# Patient Record
Sex: Female | Born: 1955
Health system: Southern US, Community
[De-identification: ages and names within clinical notes are randomized; demographics above are authoritative.]

## PROBLEM LIST (undated history)

## (undated) DIAGNOSIS — E0944 Drug or chemical induced diabetes mellitus with neurological complications with diabetic amyotrophy: Secondary | ICD-10-CM

## (undated) DIAGNOSIS — L650 Telogen effluvium: Secondary | ICD-10-CM

## (undated) DIAGNOSIS — E782 Mixed hyperlipidemia: Secondary | ICD-10-CM

## (undated) DIAGNOSIS — I10 Essential (primary) hypertension: Secondary | ICD-10-CM

## (undated) DIAGNOSIS — G4730 Sleep apnea, unspecified: Secondary | ICD-10-CM

## (undated) DIAGNOSIS — R601 Generalized edema: Secondary | ICD-10-CM

## (undated) DIAGNOSIS — L97219 Non-pressure chronic ulcer of right calf with unspecified severity: Secondary | ICD-10-CM

## (undated) DIAGNOSIS — E785 Hyperlipidemia, unspecified: Secondary | ICD-10-CM

## (undated) DIAGNOSIS — F329 Major depressive disorder, single episode, unspecified: Secondary | ICD-10-CM

## (undated) DIAGNOSIS — M797 Fibromyalgia: Secondary | ICD-10-CM

## (undated) DIAGNOSIS — E119 Type 2 diabetes mellitus without complications: Secondary | ICD-10-CM

## (undated) DIAGNOSIS — G43909 Migraine, unspecified, not intractable, without status migrainosus: Secondary | ICD-10-CM

## (undated) HISTORY — DX: Drug or chemical induced diabetes mellitus with neurological complications with diabetic amyotrophy: E09.44

## (undated) HISTORY — DX: Major depressive disorder, single episode, unspecified: F32.9

## (undated) HISTORY — DX: Essential (primary) hypertension: I10

## (undated) HISTORY — DX: Fibromyalgia: M79.7

## (undated) HISTORY — PX: CHOLECYSTECTOMY: SHX55

## (undated) HISTORY — DX: Mixed hyperlipidemia: E78.2

## (undated) HISTORY — DX: Sleep apnea, unspecified: G47.30

## (undated) HISTORY — DX: Hyperlipidemia, unspecified: E78.5

## (undated) HISTORY — DX: Telogen effluvium: L65.0

## (undated) HISTORY — PX: OTHER SURGICAL HISTORY: SHX169

## (undated) HISTORY — DX: Generalized edema: R60.1

## (undated) HISTORY — DX: Type 2 diabetes mellitus without complications: E11.9

## (undated) HISTORY — DX: Non-pressure chronic ulcer of right calf with unspecified severity: L97.219

## (undated) HISTORY — DX: Migraine, unspecified, not intractable, without status migrainosus: G43.909

---

## 2008-11-22 ENCOUNTER — Emergency Department (HOSPITAL_COMMUNITY): Admission: EM | Admit: 2008-11-22 | Discharge: 2008-11-22 | Payer: Self-pay | Admitting: Emergency Medicine

## 2008-11-24 ENCOUNTER — Ambulatory Visit (HOSPITAL_COMMUNITY): Admission: RE | Admit: 2008-11-24 | Discharge: 2008-11-24 | Payer: Self-pay | Admitting: Emergency Medicine

## 2008-11-28 ENCOUNTER — Emergency Department (HOSPITAL_COMMUNITY): Admission: EM | Admit: 2008-11-28 | Discharge: 2008-11-28 | Payer: Self-pay | Admitting: Emergency Medicine

## 2008-12-24 ENCOUNTER — Emergency Department (HOSPITAL_COMMUNITY): Admission: EM | Admit: 2008-12-24 | Discharge: 2008-12-24 | Payer: Self-pay | Admitting: Emergency Medicine

## 2009-02-10 ENCOUNTER — Ambulatory Visit (HOSPITAL_COMMUNITY): Admission: RE | Admit: 2009-02-10 | Discharge: 2009-02-10 | Payer: Self-pay | Admitting: Neurology

## 2009-03-17 ENCOUNTER — Ambulatory Visit (HOSPITAL_COMMUNITY): Admission: RE | Admit: 2009-03-17 | Discharge: 2009-03-17 | Payer: Self-pay | Admitting: Neurology

## 2010-03-13 ENCOUNTER — Emergency Department (HOSPITAL_COMMUNITY)
Admission: EM | Admit: 2010-03-13 | Discharge: 2010-03-13 | Payer: Self-pay | Source: Home / Self Care | Admitting: Family Medicine

## 2010-04-26 ENCOUNTER — Encounter: Payer: Self-pay | Admitting: Neurology

## 2010-07-07 LAB — CSF CELL COUNT WITH DIFFERENTIAL
RBC Count, CSF: 0 /mm3
WBC, CSF: 1 /mm3 (ref 0–5)

## 2010-07-07 LAB — VDRL, CSF: VDRL Quant, CSF: NONREACTIVE

## 2010-07-07 LAB — OLIGOCLONAL BANDS, CSF + SERM

## 2010-07-07 LAB — GLUCOSE, CAPILLARY: Glucose-Capillary: 98 mg/dL (ref 70–99)

## 2010-07-10 LAB — GLUCOSE, CAPILLARY: Glucose-Capillary: 236 mg/dL — ABNORMAL HIGH (ref 70–99)

## 2010-07-11 LAB — CBC
HCT: 41.3 % (ref 36.0–46.0)
Hemoglobin: 14 g/dL (ref 12.0–15.0)
RBC: 4.61 MIL/uL (ref 3.87–5.11)
RDW: 13.1 % (ref 11.5–15.5)

## 2010-07-11 LAB — BASIC METABOLIC PANEL
BUN: 22 mg/dL (ref 6–23)
Chloride: 103 mEq/L (ref 96–112)
Creatinine, Ser: 1.02 mg/dL (ref 0.4–1.2)
GFR calc non Af Amer: 56 mL/min — ABNORMAL LOW (ref >60)

## 2010-07-11 LAB — DIFFERENTIAL
Basophils Absolute: 0.1 10*3/uL (ref 0.0–0.1)
Eosinophils Relative: 5 % (ref 0–5)
Lymphocytes Relative: 25 % (ref 12–46)
Monocytes Absolute: 0.7 10*3/uL (ref 0.1–1.0)
Monocytes Relative: 7 % (ref 3–12)

## 2010-07-11 LAB — RAPID URINE DRUG SCREEN, HOSP PERFORMED
Amphetamines: NOT DETECTED
Benzodiazepines: NOT DETECTED
Cocaine: NOT DETECTED
Opiates: POSITIVE — AB
Tetrahydrocannabinol: NOT DETECTED

## 2010-07-11 LAB — GLUCOSE, CAPILLARY: Glucose-Capillary: 214 mg/dL — ABNORMAL HIGH (ref 70–99)

## 2010-07-11 LAB — URINALYSIS, ROUTINE W REFLEX MICROSCOPIC
Bilirubin Urine: NEGATIVE
Glucose, UA: 250 mg/dL — AB
Hgb urine dipstick: NEGATIVE
Ketones, ur: NEGATIVE mg/dL
Nitrite: NEGATIVE
Specific Gravity, Urine: 1.025 (ref 1.005–1.030)
pH: 5.5 (ref 5.0–8.0)

## 2011-01-20 ENCOUNTER — Other Ambulatory Visit (HOSPITAL_COMMUNITY): Payer: Self-pay | Admitting: Family Medicine

## 2011-01-20 DIAGNOSIS — Z780 Asymptomatic menopausal state: Secondary | ICD-10-CM

## 2011-01-20 DIAGNOSIS — Z1231 Encounter for screening mammogram for malignant neoplasm of breast: Secondary | ICD-10-CM

## 2011-02-10 ENCOUNTER — Ambulatory Visit (HOSPITAL_COMMUNITY): Payer: Self-pay

## 2011-03-10 ENCOUNTER — Ambulatory Visit (HOSPITAL_COMMUNITY): Payer: Self-pay

## 2011-07-01 ENCOUNTER — Encounter (INDEPENDENT_AMBULATORY_CARE_PROVIDER_SITE_OTHER): Payer: Medicare PPO | Admitting: Ophthalmology

## 2011-07-01 DIAGNOSIS — I10 Essential (primary) hypertension: Secondary | ICD-10-CM

## 2011-07-01 DIAGNOSIS — H2510 Age-related nuclear cataract, unspecified eye: Secondary | ICD-10-CM

## 2011-07-01 DIAGNOSIS — H43819 Vitreous degeneration, unspecified eye: Secondary | ICD-10-CM

## 2011-07-01 DIAGNOSIS — H35039 Hypertensive retinopathy, unspecified eye: Secondary | ICD-10-CM

## 2011-07-01 DIAGNOSIS — E11319 Type 2 diabetes mellitus with unspecified diabetic retinopathy without macular edema: Secondary | ICD-10-CM

## 2011-07-01 DIAGNOSIS — H3581 Retinal edema: Secondary | ICD-10-CM

## 2011-07-01 DIAGNOSIS — E1039 Type 1 diabetes mellitus with other diabetic ophthalmic complication: Secondary | ICD-10-CM

## 2011-07-22 ENCOUNTER — Encounter (INDEPENDENT_AMBULATORY_CARE_PROVIDER_SITE_OTHER): Payer: Medicare PPO | Admitting: Ophthalmology

## 2011-07-22 DIAGNOSIS — H3581 Retinal edema: Secondary | ICD-10-CM

## 2011-11-22 ENCOUNTER — Ambulatory Visit (INDEPENDENT_AMBULATORY_CARE_PROVIDER_SITE_OTHER): Payer: Medicare PPO | Admitting: Ophthalmology

## 2011-12-08 ENCOUNTER — Ambulatory Visit (INDEPENDENT_AMBULATORY_CARE_PROVIDER_SITE_OTHER): Payer: Medicare PPO | Admitting: Ophthalmology

## 2011-12-08 DIAGNOSIS — H43819 Vitreous degeneration, unspecified eye: Secondary | ICD-10-CM

## 2011-12-08 DIAGNOSIS — E11319 Type 2 diabetes mellitus with unspecified diabetic retinopathy without macular edema: Secondary | ICD-10-CM

## 2011-12-08 DIAGNOSIS — E1065 Type 1 diabetes mellitus with hyperglycemia: Secondary | ICD-10-CM

## 2011-12-08 DIAGNOSIS — I10 Essential (primary) hypertension: Secondary | ICD-10-CM

## 2011-12-08 DIAGNOSIS — E1039 Type 1 diabetes mellitus with other diabetic ophthalmic complication: Secondary | ICD-10-CM

## 2011-12-08 DIAGNOSIS — H35039 Hypertensive retinopathy, unspecified eye: Secondary | ICD-10-CM

## 2011-12-08 DIAGNOSIS — H2510 Age-related nuclear cataract, unspecified eye: Secondary | ICD-10-CM

## 2012-06-06 ENCOUNTER — Ambulatory Visit (INDEPENDENT_AMBULATORY_CARE_PROVIDER_SITE_OTHER): Payer: Medicare PPO | Admitting: Ophthalmology

## 2012-07-07 ENCOUNTER — Ambulatory Visit (INDEPENDENT_AMBULATORY_CARE_PROVIDER_SITE_OTHER): Payer: Medicare Other | Admitting: Ophthalmology

## 2012-07-07 DIAGNOSIS — H35039 Hypertensive retinopathy, unspecified eye: Secondary | ICD-10-CM

## 2012-07-07 DIAGNOSIS — E1039 Type 1 diabetes mellitus with other diabetic ophthalmic complication: Secondary | ICD-10-CM

## 2012-07-07 DIAGNOSIS — H2510 Age-related nuclear cataract, unspecified eye: Secondary | ICD-10-CM

## 2012-07-07 DIAGNOSIS — I10 Essential (primary) hypertension: Secondary | ICD-10-CM

## 2012-07-07 DIAGNOSIS — E11319 Type 2 diabetes mellitus with unspecified diabetic retinopathy without macular edema: Secondary | ICD-10-CM

## 2012-07-07 DIAGNOSIS — E1065 Type 1 diabetes mellitus with hyperglycemia: Secondary | ICD-10-CM

## 2012-07-07 DIAGNOSIS — D3130 Benign neoplasm of unspecified choroid: Secondary | ICD-10-CM

## 2013-04-13 ENCOUNTER — Ambulatory Visit (INDEPENDENT_AMBULATORY_CARE_PROVIDER_SITE_OTHER): Payer: Medicare Other | Admitting: Ophthalmology

## 2013-04-13 DIAGNOSIS — I10 Essential (primary) hypertension: Secondary | ICD-10-CM

## 2013-04-13 DIAGNOSIS — E1139 Type 2 diabetes mellitus with other diabetic ophthalmic complication: Secondary | ICD-10-CM

## 2013-04-13 DIAGNOSIS — D3130 Benign neoplasm of unspecified choroid: Secondary | ICD-10-CM

## 2013-04-13 DIAGNOSIS — H35039 Hypertensive retinopathy, unspecified eye: Secondary | ICD-10-CM

## 2013-04-13 DIAGNOSIS — E1165 Type 2 diabetes mellitus with hyperglycemia: Secondary | ICD-10-CM

## 2013-04-13 DIAGNOSIS — E11319 Type 2 diabetes mellitus with unspecified diabetic retinopathy without macular edema: Secondary | ICD-10-CM

## 2013-04-13 DIAGNOSIS — H43819 Vitreous degeneration, unspecified eye: Secondary | ICD-10-CM

## 2014-01-11 ENCOUNTER — Ambulatory Visit (INDEPENDENT_AMBULATORY_CARE_PROVIDER_SITE_OTHER): Payer: Medicare Other | Admitting: Ophthalmology

## 2014-04-09 DIAGNOSIS — F329 Major depressive disorder, single episode, unspecified: Secondary | ICD-10-CM | POA: Diagnosis not present

## 2014-04-09 DIAGNOSIS — E119 Type 2 diabetes mellitus without complications: Secondary | ICD-10-CM | POA: Diagnosis not present

## 2014-04-09 DIAGNOSIS — E669 Obesity, unspecified: Secondary | ICD-10-CM | POA: Diagnosis not present

## 2014-04-09 DIAGNOSIS — Z7982 Long term (current) use of aspirin: Secondary | ICD-10-CM | POA: Diagnosis not present

## 2014-04-09 DIAGNOSIS — I10 Essential (primary) hypertension: Secondary | ICD-10-CM | POA: Diagnosis not present

## 2014-04-09 DIAGNOSIS — Z791 Long term (current) use of non-steroidal anti-inflammatories (NSAID): Secondary | ICD-10-CM | POA: Diagnosis not present

## 2014-04-09 DIAGNOSIS — M1990 Unspecified osteoarthritis, unspecified site: Secondary | ICD-10-CM | POA: Diagnosis not present

## 2014-04-09 DIAGNOSIS — Z794 Long term (current) use of insulin: Secondary | ICD-10-CM | POA: Diagnosis not present

## 2014-04-11 DIAGNOSIS — E1165 Type 2 diabetes mellitus with hyperglycemia: Secondary | ICD-10-CM | POA: Diagnosis not present

## 2014-04-12 DIAGNOSIS — Z791 Long term (current) use of non-steroidal anti-inflammatories (NSAID): Secondary | ICD-10-CM | POA: Diagnosis not present

## 2014-04-12 DIAGNOSIS — E119 Type 2 diabetes mellitus without complications: Secondary | ICD-10-CM | POA: Diagnosis not present

## 2014-04-12 DIAGNOSIS — E669 Obesity, unspecified: Secondary | ICD-10-CM | POA: Diagnosis not present

## 2014-04-12 DIAGNOSIS — Z794 Long term (current) use of insulin: Secondary | ICD-10-CM | POA: Diagnosis not present

## 2014-04-12 DIAGNOSIS — Z7982 Long term (current) use of aspirin: Secondary | ICD-10-CM | POA: Diagnosis not present

## 2014-04-12 DIAGNOSIS — I10 Essential (primary) hypertension: Secondary | ICD-10-CM | POA: Diagnosis not present

## 2014-04-12 DIAGNOSIS — F329 Major depressive disorder, single episode, unspecified: Secondary | ICD-10-CM | POA: Diagnosis not present

## 2014-04-12 DIAGNOSIS — M1990 Unspecified osteoarthritis, unspecified site: Secondary | ICD-10-CM | POA: Diagnosis not present

## 2014-04-16 DIAGNOSIS — Z791 Long term (current) use of non-steroidal anti-inflammatories (NSAID): Secondary | ICD-10-CM | POA: Diagnosis not present

## 2014-04-16 DIAGNOSIS — Z794 Long term (current) use of insulin: Secondary | ICD-10-CM | POA: Diagnosis not present

## 2014-04-16 DIAGNOSIS — Z7982 Long term (current) use of aspirin: Secondary | ICD-10-CM | POA: Diagnosis not present

## 2014-04-16 DIAGNOSIS — E119 Type 2 diabetes mellitus without complications: Secondary | ICD-10-CM | POA: Diagnosis not present

## 2014-04-16 DIAGNOSIS — E669 Obesity, unspecified: Secondary | ICD-10-CM | POA: Diagnosis not present

## 2014-04-16 DIAGNOSIS — M1990 Unspecified osteoarthritis, unspecified site: Secondary | ICD-10-CM | POA: Diagnosis not present

## 2014-04-16 DIAGNOSIS — I10 Essential (primary) hypertension: Secondary | ICD-10-CM | POA: Diagnosis not present

## 2014-04-16 DIAGNOSIS — E1165 Type 2 diabetes mellitus with hyperglycemia: Secondary | ICD-10-CM | POA: Diagnosis not present

## 2014-04-16 DIAGNOSIS — F329 Major depressive disorder, single episode, unspecified: Secondary | ICD-10-CM | POA: Diagnosis not present

## 2014-04-17 DIAGNOSIS — I10 Essential (primary) hypertension: Secondary | ICD-10-CM | POA: Diagnosis not present

## 2014-04-17 DIAGNOSIS — E669 Obesity, unspecified: Secondary | ICD-10-CM | POA: Diagnosis not present

## 2014-04-17 DIAGNOSIS — F329 Major depressive disorder, single episode, unspecified: Secondary | ICD-10-CM | POA: Diagnosis not present

## 2014-04-17 DIAGNOSIS — Z794 Long term (current) use of insulin: Secondary | ICD-10-CM | POA: Diagnosis not present

## 2014-04-17 DIAGNOSIS — E119 Type 2 diabetes mellitus without complications: Secondary | ICD-10-CM | POA: Diagnosis not present

## 2014-04-17 DIAGNOSIS — M1990 Unspecified osteoarthritis, unspecified site: Secondary | ICD-10-CM | POA: Diagnosis not present

## 2014-04-17 DIAGNOSIS — Z7982 Long term (current) use of aspirin: Secondary | ICD-10-CM | POA: Diagnosis not present

## 2014-04-17 DIAGNOSIS — Z791 Long term (current) use of non-steroidal anti-inflammatories (NSAID): Secondary | ICD-10-CM | POA: Diagnosis not present

## 2014-04-18 DIAGNOSIS — F329 Major depressive disorder, single episode, unspecified: Secondary | ICD-10-CM | POA: Diagnosis not present

## 2014-04-18 DIAGNOSIS — E119 Type 2 diabetes mellitus without complications: Secondary | ICD-10-CM | POA: Diagnosis not present

## 2014-04-18 DIAGNOSIS — Z7982 Long term (current) use of aspirin: Secondary | ICD-10-CM | POA: Diagnosis not present

## 2014-04-18 DIAGNOSIS — Z794 Long term (current) use of insulin: Secondary | ICD-10-CM | POA: Diagnosis not present

## 2014-04-18 DIAGNOSIS — E669 Obesity, unspecified: Secondary | ICD-10-CM | POA: Diagnosis not present

## 2014-04-18 DIAGNOSIS — Z791 Long term (current) use of non-steroidal anti-inflammatories (NSAID): Secondary | ICD-10-CM | POA: Diagnosis not present

## 2014-04-18 DIAGNOSIS — I10 Essential (primary) hypertension: Secondary | ICD-10-CM | POA: Diagnosis not present

## 2014-04-18 DIAGNOSIS — M1990 Unspecified osteoarthritis, unspecified site: Secondary | ICD-10-CM | POA: Diagnosis not present

## 2014-04-23 DIAGNOSIS — E119 Type 2 diabetes mellitus without complications: Secondary | ICD-10-CM | POA: Diagnosis not present

## 2014-04-23 DIAGNOSIS — M1990 Unspecified osteoarthritis, unspecified site: Secondary | ICD-10-CM | POA: Diagnosis not present

## 2014-04-23 DIAGNOSIS — Z791 Long term (current) use of non-steroidal anti-inflammatories (NSAID): Secondary | ICD-10-CM | POA: Diagnosis not present

## 2014-04-23 DIAGNOSIS — Z7982 Long term (current) use of aspirin: Secondary | ICD-10-CM | POA: Diagnosis not present

## 2014-04-23 DIAGNOSIS — Z794 Long term (current) use of insulin: Secondary | ICD-10-CM | POA: Diagnosis not present

## 2014-04-23 DIAGNOSIS — E669 Obesity, unspecified: Secondary | ICD-10-CM | POA: Diagnosis not present

## 2014-04-23 DIAGNOSIS — F329 Major depressive disorder, single episode, unspecified: Secondary | ICD-10-CM | POA: Diagnosis not present

## 2014-04-23 DIAGNOSIS — I10 Essential (primary) hypertension: Secondary | ICD-10-CM | POA: Diagnosis not present

## 2014-05-01 DIAGNOSIS — E119 Type 2 diabetes mellitus without complications: Secondary | ICD-10-CM | POA: Diagnosis not present

## 2014-05-01 DIAGNOSIS — Z7982 Long term (current) use of aspirin: Secondary | ICD-10-CM | POA: Diagnosis not present

## 2014-05-01 DIAGNOSIS — I10 Essential (primary) hypertension: Secondary | ICD-10-CM | POA: Diagnosis not present

## 2014-05-01 DIAGNOSIS — Z791 Long term (current) use of non-steroidal anti-inflammatories (NSAID): Secondary | ICD-10-CM | POA: Diagnosis not present

## 2014-05-01 DIAGNOSIS — F329 Major depressive disorder, single episode, unspecified: Secondary | ICD-10-CM | POA: Diagnosis not present

## 2014-05-01 DIAGNOSIS — Z794 Long term (current) use of insulin: Secondary | ICD-10-CM | POA: Diagnosis not present

## 2014-05-01 DIAGNOSIS — E669 Obesity, unspecified: Secondary | ICD-10-CM | POA: Diagnosis not present

## 2014-05-01 DIAGNOSIS — M1990 Unspecified osteoarthritis, unspecified site: Secondary | ICD-10-CM | POA: Diagnosis not present

## 2014-05-28 DIAGNOSIS — Z7982 Long term (current) use of aspirin: Secondary | ICD-10-CM | POA: Diagnosis not present

## 2014-05-28 DIAGNOSIS — Z794 Long term (current) use of insulin: Secondary | ICD-10-CM | POA: Diagnosis not present

## 2014-05-28 DIAGNOSIS — F329 Major depressive disorder, single episode, unspecified: Secondary | ICD-10-CM | POA: Diagnosis not present

## 2014-05-28 DIAGNOSIS — E669 Obesity, unspecified: Secondary | ICD-10-CM | POA: Diagnosis not present

## 2014-05-28 DIAGNOSIS — M1990 Unspecified osteoarthritis, unspecified site: Secondary | ICD-10-CM | POA: Diagnosis not present

## 2014-05-28 DIAGNOSIS — Z791 Long term (current) use of non-steroidal anti-inflammatories (NSAID): Secondary | ICD-10-CM | POA: Diagnosis not present

## 2014-05-28 DIAGNOSIS — E119 Type 2 diabetes mellitus without complications: Secondary | ICD-10-CM | POA: Diagnosis not present

## 2014-05-28 DIAGNOSIS — I10 Essential (primary) hypertension: Secondary | ICD-10-CM | POA: Diagnosis not present

## 2014-12-12 DIAGNOSIS — H5201 Hypermetropia, right eye: Secondary | ICD-10-CM | POA: Diagnosis not present

## 2014-12-12 DIAGNOSIS — E11339 Type 2 diabetes mellitus with moderate nonproliferative diabetic retinopathy without macular edema: Secondary | ICD-10-CM | POA: Diagnosis not present

## 2015-04-01 DIAGNOSIS — J3089 Other allergic rhinitis: Secondary | ICD-10-CM | POA: Diagnosis not present

## 2015-04-01 DIAGNOSIS — L658 Other specified nonscarring hair loss: Secondary | ICD-10-CM | POA: Diagnosis not present

## 2015-04-01 DIAGNOSIS — F321 Major depressive disorder, single episode, moderate: Secondary | ICD-10-CM | POA: Diagnosis not present

## 2015-04-01 DIAGNOSIS — E1142 Type 2 diabetes mellitus with diabetic polyneuropathy: Secondary | ICD-10-CM | POA: Diagnosis not present

## 2015-04-01 DIAGNOSIS — I10 Essential (primary) hypertension: Secondary | ICD-10-CM | POA: Diagnosis not present

## 2015-04-01 DIAGNOSIS — L630 Alopecia (capitis) totalis: Secondary | ICD-10-CM | POA: Diagnosis not present

## 2015-04-01 DIAGNOSIS — E782 Mixed hyperlipidemia: Secondary | ICD-10-CM | POA: Diagnosis not present

## 2015-05-05 DIAGNOSIS — E1142 Type 2 diabetes mellitus with diabetic polyneuropathy: Secondary | ICD-10-CM | POA: Diagnosis not present

## 2015-05-05 DIAGNOSIS — E782 Mixed hyperlipidemia: Secondary | ICD-10-CM | POA: Diagnosis not present

## 2015-05-05 DIAGNOSIS — I10 Essential (primary) hypertension: Secondary | ICD-10-CM | POA: Diagnosis not present

## 2015-06-16 DIAGNOSIS — E113393 Type 2 diabetes mellitus with moderate nonproliferative diabetic retinopathy without macular edema, bilateral: Secondary | ICD-10-CM | POA: Diagnosis not present

## 2015-07-21 DIAGNOSIS — E1142 Type 2 diabetes mellitus with diabetic polyneuropathy: Secondary | ICD-10-CM | POA: Diagnosis not present

## 2015-07-21 DIAGNOSIS — E782 Mixed hyperlipidemia: Secondary | ICD-10-CM | POA: Diagnosis not present

## 2015-07-21 DIAGNOSIS — I10 Essential (primary) hypertension: Secondary | ICD-10-CM | POA: Diagnosis not present

## 2015-07-21 DIAGNOSIS — E1321 Other specified diabetes mellitus with diabetic nephropathy: Secondary | ICD-10-CM | POA: Diagnosis not present

## 2015-07-21 DIAGNOSIS — E119 Type 2 diabetes mellitus without complications: Secondary | ICD-10-CM | POA: Diagnosis not present

## 2015-08-11 DIAGNOSIS — F5101 Primary insomnia: Secondary | ICD-10-CM | POA: Diagnosis not present

## 2015-08-11 DIAGNOSIS — I10 Essential (primary) hypertension: Secondary | ICD-10-CM | POA: Diagnosis not present

## 2015-10-21 DIAGNOSIS — E1121 Type 2 diabetes mellitus with diabetic nephropathy: Secondary | ICD-10-CM | POA: Diagnosis not present

## 2015-10-21 DIAGNOSIS — E1142 Type 2 diabetes mellitus with diabetic polyneuropathy: Secondary | ICD-10-CM | POA: Diagnosis not present

## 2015-10-21 DIAGNOSIS — E782 Mixed hyperlipidemia: Secondary | ICD-10-CM | POA: Diagnosis not present

## 2015-10-21 DIAGNOSIS — E1321 Other specified diabetes mellitus with diabetic nephropathy: Secondary | ICD-10-CM | POA: Diagnosis not present

## 2015-10-21 DIAGNOSIS — I10 Essential (primary) hypertension: Secondary | ICD-10-CM | POA: Diagnosis not present

## 2015-11-13 DIAGNOSIS — B029 Zoster without complications: Secondary | ICD-10-CM | POA: Diagnosis not present

## 2015-11-17 DIAGNOSIS — M25562 Pain in left knee: Secondary | ICD-10-CM | POA: Diagnosis not present

## 2015-11-17 DIAGNOSIS — M25561 Pain in right knee: Secondary | ICD-10-CM | POA: Diagnosis not present

## 2015-11-17 DIAGNOSIS — M5136 Other intervertebral disc degeneration, lumbar region: Secondary | ICD-10-CM | POA: Diagnosis not present

## 2015-11-17 DIAGNOSIS — M545 Low back pain: Secondary | ICD-10-CM | POA: Diagnosis not present

## 2015-11-17 DIAGNOSIS — M5137 Other intervertebral disc degeneration, lumbosacral region: Secondary | ICD-10-CM | POA: Diagnosis not present

## 2015-11-24 DIAGNOSIS — M545 Low back pain: Secondary | ICD-10-CM | POA: Diagnosis not present

## 2015-11-24 DIAGNOSIS — R05 Cough: Secondary | ICD-10-CM | POA: Diagnosis not present

## 2015-11-24 DIAGNOSIS — J168 Pneumonia due to other specified infectious organisms: Secondary | ICD-10-CM | POA: Diagnosis not present

## 2015-12-15 DIAGNOSIS — E113393 Type 2 diabetes mellitus with moderate nonproliferative diabetic retinopathy without macular edema, bilateral: Secondary | ICD-10-CM | POA: Diagnosis not present

## 2016-02-24 DIAGNOSIS — E1121 Type 2 diabetes mellitus with diabetic nephropathy: Secondary | ICD-10-CM | POA: Diagnosis not present

## 2016-02-24 DIAGNOSIS — I10 Essential (primary) hypertension: Secondary | ICD-10-CM | POA: Diagnosis not present

## 2016-02-24 DIAGNOSIS — S80811A Abrasion, right lower leg, initial encounter: Secondary | ICD-10-CM | POA: Diagnosis not present

## 2016-02-24 DIAGNOSIS — E782 Mixed hyperlipidemia: Secondary | ICD-10-CM | POA: Diagnosis not present

## 2016-03-10 DIAGNOSIS — I10 Essential (primary) hypertension: Secondary | ICD-10-CM | POA: Diagnosis not present

## 2016-03-10 DIAGNOSIS — E1321 Other specified diabetes mellitus with diabetic nephropathy: Secondary | ICD-10-CM | POA: Diagnosis not present

## 2016-03-10 DIAGNOSIS — E782 Mixed hyperlipidemia: Secondary | ICD-10-CM | POA: Diagnosis not present

## 2016-03-10 DIAGNOSIS — S80811A Abrasion, right lower leg, initial encounter: Secondary | ICD-10-CM | POA: Diagnosis not present

## 2016-03-10 DIAGNOSIS — E1142 Type 2 diabetes mellitus with diabetic polyneuropathy: Secondary | ICD-10-CM | POA: Diagnosis not present

## 2016-03-15 DIAGNOSIS — L89893 Pressure ulcer of other site, stage 3: Secondary | ICD-10-CM | POA: Diagnosis not present

## 2016-03-15 DIAGNOSIS — J208 Acute bronchitis due to other specified organisms: Secondary | ICD-10-CM | POA: Diagnosis not present

## 2016-03-19 DIAGNOSIS — L89893 Pressure ulcer of other site, stage 3: Secondary | ICD-10-CM | POA: Diagnosis not present

## 2016-06-03 DIAGNOSIS — R69 Illness, unspecified: Secondary | ICD-10-CM | POA: Diagnosis not present

## 2016-06-11 DIAGNOSIS — R69 Illness, unspecified: Secondary | ICD-10-CM | POA: Diagnosis not present

## 2016-06-16 DIAGNOSIS — M1712 Unilateral primary osteoarthritis, left knee: Secondary | ICD-10-CM | POA: Diagnosis not present

## 2016-06-16 DIAGNOSIS — M5126 Other intervertebral disc displacement, lumbar region: Secondary | ICD-10-CM | POA: Diagnosis not present

## 2016-06-16 DIAGNOSIS — M545 Low back pain: Secondary | ICD-10-CM | POA: Diagnosis not present

## 2016-06-16 DIAGNOSIS — M1711 Unilateral primary osteoarthritis, right knee: Secondary | ICD-10-CM | POA: Diagnosis not present

## 2016-06-17 DIAGNOSIS — E1142 Type 2 diabetes mellitus with diabetic polyneuropathy: Secondary | ICD-10-CM | POA: Diagnosis not present

## 2016-06-17 DIAGNOSIS — E782 Mixed hyperlipidemia: Secondary | ICD-10-CM | POA: Diagnosis not present

## 2016-06-17 DIAGNOSIS — E1321 Other specified diabetes mellitus with diabetic nephropathy: Secondary | ICD-10-CM | POA: Diagnosis not present

## 2016-06-17 DIAGNOSIS — R69 Illness, unspecified: Secondary | ICD-10-CM | POA: Diagnosis not present

## 2016-06-17 DIAGNOSIS — I10 Essential (primary) hypertension: Secondary | ICD-10-CM | POA: Diagnosis not present

## 2016-09-16 DIAGNOSIS — R400 Somnolence: Secondary | ICD-10-CM | POA: Diagnosis not present

## 2016-09-16 DIAGNOSIS — E782 Mixed hyperlipidemia: Secondary | ICD-10-CM | POA: Diagnosis not present

## 2016-09-16 DIAGNOSIS — M797 Fibromyalgia: Secondary | ICD-10-CM | POA: Diagnosis not present

## 2016-09-16 DIAGNOSIS — E1142 Type 2 diabetes mellitus with diabetic polyneuropathy: Secondary | ICD-10-CM | POA: Diagnosis not present

## 2016-09-16 DIAGNOSIS — I10 Essential (primary) hypertension: Secondary | ICD-10-CM | POA: Diagnosis not present

## 2016-09-21 DIAGNOSIS — R69 Illness, unspecified: Secondary | ICD-10-CM | POA: Diagnosis not present

## 2016-12-14 DIAGNOSIS — R69 Illness, unspecified: Secondary | ICD-10-CM | POA: Diagnosis not present

## 2016-12-15 DIAGNOSIS — M19042 Primary osteoarthritis, left hand: Secondary | ICD-10-CM | POA: Diagnosis not present

## 2016-12-15 DIAGNOSIS — M25571 Pain in right ankle and joints of right foot: Secondary | ICD-10-CM | POA: Diagnosis not present

## 2016-12-15 DIAGNOSIS — S43421A Sprain of right rotator cuff capsule, initial encounter: Secondary | ICD-10-CM | POA: Diagnosis not present

## 2016-12-15 DIAGNOSIS — M2352 Chronic instability of knee, left knee: Secondary | ICD-10-CM | POA: Diagnosis not present

## 2016-12-15 DIAGNOSIS — M2351 Chronic instability of knee, right knee: Secondary | ICD-10-CM | POA: Diagnosis not present

## 2016-12-15 DIAGNOSIS — M25572 Pain in left ankle and joints of left foot: Secondary | ICD-10-CM | POA: Diagnosis not present

## 2016-12-15 DIAGNOSIS — M19041 Primary osteoarthritis, right hand: Secondary | ICD-10-CM | POA: Diagnosis not present

## 2016-12-15 DIAGNOSIS — M1712 Unilateral primary osteoarthritis, left knee: Secondary | ICD-10-CM | POA: Diagnosis not present

## 2016-12-15 DIAGNOSIS — M532X7 Spinal instabilities, lumbosacral region: Secondary | ICD-10-CM | POA: Diagnosis not present

## 2016-12-15 DIAGNOSIS — M1711 Unilateral primary osteoarthritis, right knee: Secondary | ICD-10-CM | POA: Diagnosis not present

## 2016-12-15 DIAGNOSIS — M545 Low back pain: Secondary | ICD-10-CM | POA: Diagnosis not present

## 2016-12-22 DIAGNOSIS — E782 Mixed hyperlipidemia: Secondary | ICD-10-CM | POA: Diagnosis not present

## 2016-12-22 DIAGNOSIS — I10 Essential (primary) hypertension: Secondary | ICD-10-CM | POA: Diagnosis not present

## 2016-12-22 DIAGNOSIS — M797 Fibromyalgia: Secondary | ICD-10-CM | POA: Diagnosis not present

## 2016-12-22 DIAGNOSIS — R400 Somnolence: Secondary | ICD-10-CM | POA: Diagnosis not present

## 2016-12-22 DIAGNOSIS — E1142 Type 2 diabetes mellitus with diabetic polyneuropathy: Secondary | ICD-10-CM | POA: Diagnosis not present

## 2016-12-27 DIAGNOSIS — R69 Illness, unspecified: Secondary | ICD-10-CM | POA: Diagnosis not present

## 2017-01-10 DIAGNOSIS — G4710 Hypersomnia, unspecified: Secondary | ICD-10-CM | POA: Diagnosis not present

## 2017-04-08 DIAGNOSIS — E1142 Type 2 diabetes mellitus with diabetic polyneuropathy: Secondary | ICD-10-CM | POA: Diagnosis not present

## 2017-04-08 DIAGNOSIS — R400 Somnolence: Secondary | ICD-10-CM | POA: Diagnosis not present

## 2017-04-08 DIAGNOSIS — E782 Mixed hyperlipidemia: Secondary | ICD-10-CM | POA: Diagnosis not present

## 2017-04-08 DIAGNOSIS — R918 Other nonspecific abnormal finding of lung field: Secondary | ICD-10-CM | POA: Diagnosis not present

## 2017-04-08 DIAGNOSIS — R042 Hemoptysis: Secondary | ICD-10-CM | POA: Diagnosis not present

## 2017-04-08 DIAGNOSIS — M797 Fibromyalgia: Secondary | ICD-10-CM | POA: Diagnosis not present

## 2017-04-08 DIAGNOSIS — Z23 Encounter for immunization: Secondary | ICD-10-CM | POA: Diagnosis not present

## 2017-04-08 DIAGNOSIS — J9811 Atelectasis: Secondary | ICD-10-CM | POA: Diagnosis not present

## 2017-04-08 DIAGNOSIS — J168 Pneumonia due to other specified infectious organisms: Secondary | ICD-10-CM | POA: Diagnosis not present

## 2017-04-08 DIAGNOSIS — I10 Essential (primary) hypertension: Secondary | ICD-10-CM | POA: Diagnosis not present

## 2017-05-12 DIAGNOSIS — M797 Fibromyalgia: Secondary | ICD-10-CM | POA: Diagnosis not present

## 2017-05-12 DIAGNOSIS — J158 Pneumonia due to other specified bacteria: Secondary | ICD-10-CM | POA: Diagnosis not present

## 2017-05-12 DIAGNOSIS — G4733 Obstructive sleep apnea (adult) (pediatric): Secondary | ICD-10-CM | POA: Diagnosis not present

## 2017-05-12 DIAGNOSIS — Z1231 Encounter for screening mammogram for malignant neoplasm of breast: Secondary | ICD-10-CM | POA: Diagnosis not present

## 2017-05-12 DIAGNOSIS — R600 Localized edema: Secondary | ICD-10-CM | POA: Diagnosis not present

## 2017-05-12 DIAGNOSIS — E1142 Type 2 diabetes mellitus with diabetic polyneuropathy: Secondary | ICD-10-CM | POA: Diagnosis not present

## 2017-05-16 DIAGNOSIS — Z8701 Personal history of pneumonia (recurrent): Secondary | ICD-10-CM | POA: Diagnosis not present

## 2017-05-16 DIAGNOSIS — J189 Pneumonia, unspecified organism: Secondary | ICD-10-CM | POA: Diagnosis not present

## 2017-05-16 DIAGNOSIS — R69 Illness, unspecified: Secondary | ICD-10-CM | POA: Diagnosis not present

## 2017-05-16 DIAGNOSIS — I700 Atherosclerosis of aorta: Secondary | ICD-10-CM | POA: Diagnosis not present

## 2017-05-25 DIAGNOSIS — R69 Illness, unspecified: Secondary | ICD-10-CM | POA: Diagnosis not present

## 2017-08-12 DIAGNOSIS — Z794 Long term (current) use of insulin: Secondary | ICD-10-CM | POA: Diagnosis not present

## 2017-08-12 DIAGNOSIS — E119 Type 2 diabetes mellitus without complications: Secondary | ICD-10-CM | POA: Diagnosis not present

## 2017-08-25 DIAGNOSIS — E1142 Type 2 diabetes mellitus with diabetic polyneuropathy: Secondary | ICD-10-CM | POA: Diagnosis not present

## 2017-08-25 DIAGNOSIS — M797 Fibromyalgia: Secondary | ICD-10-CM | POA: Diagnosis not present

## 2017-08-25 DIAGNOSIS — E782 Mixed hyperlipidemia: Secondary | ICD-10-CM | POA: Diagnosis not present

## 2017-08-25 DIAGNOSIS — I10 Essential (primary) hypertension: Secondary | ICD-10-CM | POA: Diagnosis not present

## 2017-08-25 DIAGNOSIS — Z7689 Persons encountering health services in other specified circumstances: Secondary | ICD-10-CM | POA: Diagnosis not present

## 2017-08-25 DIAGNOSIS — Z6841 Body Mass Index (BMI) 40.0 and over, adult: Secondary | ICD-10-CM | POA: Diagnosis not present

## 2017-08-25 DIAGNOSIS — L650 Telogen effluvium: Secondary | ICD-10-CM | POA: Diagnosis not present

## 2017-08-25 DIAGNOSIS — Z1231 Encounter for screening mammogram for malignant neoplasm of breast: Secondary | ICD-10-CM | POA: Diagnosis not present

## 2017-08-31 DIAGNOSIS — R69 Illness, unspecified: Secondary | ICD-10-CM | POA: Diagnosis not present

## 2017-10-20 DIAGNOSIS — R69 Illness, unspecified: Secondary | ICD-10-CM | POA: Diagnosis not present

## 2017-10-25 DIAGNOSIS — R69 Illness, unspecified: Secondary | ICD-10-CM | POA: Diagnosis not present

## 2017-11-11 DIAGNOSIS — Z794 Long term (current) use of insulin: Secondary | ICD-10-CM | POA: Diagnosis not present

## 2017-11-11 DIAGNOSIS — E119 Type 2 diabetes mellitus without complications: Secondary | ICD-10-CM | POA: Diagnosis not present

## 2017-12-02 DIAGNOSIS — R69 Illness, unspecified: Secondary | ICD-10-CM | POA: Diagnosis not present

## 2017-12-06 DIAGNOSIS — Z1231 Encounter for screening mammogram for malignant neoplasm of breast: Secondary | ICD-10-CM | POA: Diagnosis not present

## 2017-12-06 DIAGNOSIS — M797 Fibromyalgia: Secondary | ICD-10-CM | POA: Diagnosis not present

## 2017-12-06 DIAGNOSIS — Z6841 Body Mass Index (BMI) 40.0 and over, adult: Secondary | ICD-10-CM | POA: Diagnosis not present

## 2017-12-06 DIAGNOSIS — R635 Abnormal weight gain: Secondary | ICD-10-CM | POA: Diagnosis not present

## 2017-12-06 DIAGNOSIS — I10 Essential (primary) hypertension: Secondary | ICD-10-CM | POA: Diagnosis not present

## 2017-12-06 DIAGNOSIS — R601 Generalized edema: Secondary | ICD-10-CM | POA: Diagnosis not present

## 2017-12-06 DIAGNOSIS — E1142 Type 2 diabetes mellitus with diabetic polyneuropathy: Secondary | ICD-10-CM | POA: Diagnosis not present

## 2017-12-06 DIAGNOSIS — E782 Mixed hyperlipidemia: Secondary | ICD-10-CM | POA: Diagnosis not present

## 2017-12-19 DIAGNOSIS — H52223 Regular astigmatism, bilateral: Secondary | ICD-10-CM | POA: Diagnosis not present

## 2018-01-02 DIAGNOSIS — R69 Illness, unspecified: Secondary | ICD-10-CM | POA: Diagnosis not present

## 2018-01-09 DIAGNOSIS — E113311 Type 2 diabetes mellitus with moderate nonproliferative diabetic retinopathy with macular edema, right eye: Secondary | ICD-10-CM | POA: Diagnosis not present

## 2018-02-10 DIAGNOSIS — E119 Type 2 diabetes mellitus without complications: Secondary | ICD-10-CM | POA: Diagnosis not present

## 2018-02-10 DIAGNOSIS — Z794 Long term (current) use of insulin: Secondary | ICD-10-CM | POA: Diagnosis not present

## 2018-02-20 DIAGNOSIS — Z23 Encounter for immunization: Secondary | ICD-10-CM | POA: Diagnosis not present

## 2018-02-20 DIAGNOSIS — Z6841 Body Mass Index (BMI) 40.0 and over, adult: Secondary | ICD-10-CM | POA: Diagnosis not present

## 2018-02-20 DIAGNOSIS — Z0000 Encounter for general adult medical examination without abnormal findings: Secondary | ICD-10-CM | POA: Diagnosis not present

## 2018-03-08 DIAGNOSIS — E1142 Type 2 diabetes mellitus with diabetic polyneuropathy: Secondary | ICD-10-CM | POA: Diagnosis not present

## 2018-03-08 DIAGNOSIS — R601 Generalized edema: Secondary | ICD-10-CM | POA: Diagnosis not present

## 2018-03-08 DIAGNOSIS — I10 Essential (primary) hypertension: Secondary | ICD-10-CM | POA: Diagnosis not present

## 2018-03-08 DIAGNOSIS — Z794 Long term (current) use of insulin: Secondary | ICD-10-CM | POA: Diagnosis not present

## 2018-03-08 DIAGNOSIS — Z6841 Body Mass Index (BMI) 40.0 and over, adult: Secondary | ICD-10-CM | POA: Diagnosis not present

## 2018-03-08 DIAGNOSIS — E782 Mixed hyperlipidemia: Secondary | ICD-10-CM | POA: Diagnosis not present

## 2018-03-08 DIAGNOSIS — M797 Fibromyalgia: Secondary | ICD-10-CM | POA: Diagnosis not present

## 2018-03-31 DIAGNOSIS — Z1231 Encounter for screening mammogram for malignant neoplasm of breast: Secondary | ICD-10-CM | POA: Diagnosis not present

## 2018-03-31 LAB — HM MAMMOGRAPHY: HM Mammogram: NORMAL (ref 0–4)

## 2018-04-25 DIAGNOSIS — R69 Illness, unspecified: Secondary | ICD-10-CM | POA: Diagnosis not present

## 2018-05-24 DIAGNOSIS — Z794 Long term (current) use of insulin: Secondary | ICD-10-CM | POA: Diagnosis not present

## 2018-05-24 DIAGNOSIS — E119 Type 2 diabetes mellitus without complications: Secondary | ICD-10-CM | POA: Diagnosis not present

## 2018-08-09 DIAGNOSIS — G4733 Obstructive sleep apnea (adult) (pediatric): Secondary | ICD-10-CM | POA: Diagnosis not present

## 2018-08-09 DIAGNOSIS — M797 Fibromyalgia: Secondary | ICD-10-CM | POA: Diagnosis not present

## 2018-08-09 DIAGNOSIS — I10 Essential (primary) hypertension: Secondary | ICD-10-CM | POA: Diagnosis not present

## 2018-08-09 DIAGNOSIS — R69 Illness, unspecified: Secondary | ICD-10-CM | POA: Diagnosis not present

## 2018-08-09 DIAGNOSIS — E1142 Type 2 diabetes mellitus with diabetic polyneuropathy: Secondary | ICD-10-CM | POA: Diagnosis not present

## 2018-08-09 DIAGNOSIS — E782 Mixed hyperlipidemia: Secondary | ICD-10-CM | POA: Diagnosis not present

## 2018-09-06 DIAGNOSIS — E1142 Type 2 diabetes mellitus with diabetic polyneuropathy: Secondary | ICD-10-CM | POA: Diagnosis not present

## 2018-09-06 DIAGNOSIS — I10 Essential (primary) hypertension: Secondary | ICD-10-CM | POA: Diagnosis not present

## 2018-09-06 DIAGNOSIS — E782 Mixed hyperlipidemia: Secondary | ICD-10-CM | POA: Diagnosis not present

## 2018-09-19 DIAGNOSIS — E119 Type 2 diabetes mellitus without complications: Secondary | ICD-10-CM | POA: Diagnosis not present

## 2018-09-19 DIAGNOSIS — Z794 Long term (current) use of insulin: Secondary | ICD-10-CM | POA: Diagnosis not present

## 2018-11-02 DIAGNOSIS — E113393 Type 2 diabetes mellitus with moderate nonproliferative diabetic retinopathy without macular edema, bilateral: Secondary | ICD-10-CM | POA: Diagnosis not present

## 2018-11-09 DIAGNOSIS — M797 Fibromyalgia: Secondary | ICD-10-CM | POA: Diagnosis not present

## 2018-11-09 DIAGNOSIS — E1169 Type 2 diabetes mellitus with other specified complication: Secondary | ICD-10-CM | POA: Diagnosis not present

## 2018-11-09 DIAGNOSIS — G4733 Obstructive sleep apnea (adult) (pediatric): Secondary | ICD-10-CM | POA: Diagnosis not present

## 2018-11-09 DIAGNOSIS — E782 Mixed hyperlipidemia: Secondary | ICD-10-CM | POA: Diagnosis not present

## 2018-11-09 DIAGNOSIS — R42 Dizziness and giddiness: Secondary | ICD-10-CM | POA: Diagnosis not present

## 2018-11-09 DIAGNOSIS — E1142 Type 2 diabetes mellitus with diabetic polyneuropathy: Secondary | ICD-10-CM | POA: Diagnosis not present

## 2018-11-09 DIAGNOSIS — L97211 Non-pressure chronic ulcer of right calf limited to breakdown of skin: Secondary | ICD-10-CM | POA: Diagnosis not present

## 2018-11-09 DIAGNOSIS — I10 Essential (primary) hypertension: Secondary | ICD-10-CM | POA: Diagnosis not present

## 2018-11-09 DIAGNOSIS — R69 Illness, unspecified: Secondary | ICD-10-CM | POA: Diagnosis not present

## 2018-11-09 DIAGNOSIS — R600 Localized edema: Secondary | ICD-10-CM | POA: Diagnosis not present

## 2018-11-14 DIAGNOSIS — L97211 Non-pressure chronic ulcer of right calf limited to breakdown of skin: Secondary | ICD-10-CM | POA: Diagnosis not present

## 2018-11-14 DIAGNOSIS — R600 Localized edema: Secondary | ICD-10-CM | POA: Diagnosis not present

## 2018-12-19 DIAGNOSIS — Z794 Long term (current) use of insulin: Secondary | ICD-10-CM | POA: Diagnosis not present

## 2018-12-19 DIAGNOSIS — E119 Type 2 diabetes mellitus without complications: Secondary | ICD-10-CM | POA: Diagnosis not present

## 2019-02-12 DIAGNOSIS — Z23 Encounter for immunization: Secondary | ICD-10-CM | POA: Diagnosis not present

## 2019-02-12 DIAGNOSIS — R69 Illness, unspecified: Secondary | ICD-10-CM | POA: Diagnosis not present

## 2019-02-12 DIAGNOSIS — E782 Mixed hyperlipidemia: Secondary | ICD-10-CM | POA: Diagnosis not present

## 2019-02-12 DIAGNOSIS — E1142 Type 2 diabetes mellitus with diabetic polyneuropathy: Secondary | ICD-10-CM | POA: Diagnosis not present

## 2019-02-12 DIAGNOSIS — M797 Fibromyalgia: Secondary | ICD-10-CM | POA: Diagnosis not present

## 2019-02-12 DIAGNOSIS — L97211 Non-pressure chronic ulcer of right calf limited to breakdown of skin: Secondary | ICD-10-CM | POA: Diagnosis not present

## 2019-02-12 DIAGNOSIS — I10 Essential (primary) hypertension: Secondary | ICD-10-CM | POA: Diagnosis not present

## 2019-02-12 DIAGNOSIS — G4733 Obstructive sleep apnea (adult) (pediatric): Secondary | ICD-10-CM | POA: Diagnosis not present

## 2019-02-12 LAB — HEMOGLOBIN A1C: Hemoglobin A1C: 7.5

## 2019-02-19 DIAGNOSIS — L03115 Cellulitis of right lower limb: Secondary | ICD-10-CM | POA: Diagnosis not present

## 2019-02-19 DIAGNOSIS — L97211 Non-pressure chronic ulcer of right calf limited to breakdown of skin: Secondary | ICD-10-CM | POA: Diagnosis not present

## 2019-02-26 DIAGNOSIS — L97812 Non-pressure chronic ulcer of other part of right lower leg with fat layer exposed: Secondary | ICD-10-CM | POA: Diagnosis not present

## 2019-02-26 DIAGNOSIS — I10 Essential (primary) hypertension: Secondary | ICD-10-CM | POA: Diagnosis not present

## 2019-02-26 DIAGNOSIS — E119 Type 2 diabetes mellitus without complications: Secondary | ICD-10-CM | POA: Diagnosis not present

## 2019-02-26 DIAGNOSIS — I87311 Chronic venous hypertension (idiopathic) with ulcer of right lower extremity: Secondary | ICD-10-CM | POA: Diagnosis not present

## 2019-02-26 DIAGNOSIS — L97819 Non-pressure chronic ulcer of other part of right lower leg with unspecified severity: Secondary | ICD-10-CM | POA: Diagnosis not present

## 2019-03-07 DIAGNOSIS — L97819 Non-pressure chronic ulcer of other part of right lower leg with unspecified severity: Secondary | ICD-10-CM | POA: Diagnosis not present

## 2019-03-07 DIAGNOSIS — I87311 Chronic venous hypertension (idiopathic) with ulcer of right lower extremity: Secondary | ICD-10-CM | POA: Diagnosis not present

## 2019-03-07 DIAGNOSIS — I87301 Chronic venous hypertension (idiopathic) without complications of right lower extremity: Secondary | ICD-10-CM | POA: Diagnosis not present

## 2019-03-07 DIAGNOSIS — E119 Type 2 diabetes mellitus without complications: Secondary | ICD-10-CM | POA: Diagnosis not present

## 2019-03-07 DIAGNOSIS — I10 Essential (primary) hypertension: Secondary | ICD-10-CM | POA: Diagnosis not present

## 2019-03-20 DIAGNOSIS — E119 Type 2 diabetes mellitus without complications: Secondary | ICD-10-CM | POA: Diagnosis not present

## 2019-03-20 DIAGNOSIS — Z794 Long term (current) use of insulin: Secondary | ICD-10-CM | POA: Diagnosis not present

## 2019-04-04 DIAGNOSIS — L97812 Non-pressure chronic ulcer of other part of right lower leg with fat layer exposed: Secondary | ICD-10-CM | POA: Diagnosis not present

## 2019-04-04 DIAGNOSIS — I87311 Chronic venous hypertension (idiopathic) with ulcer of right lower extremity: Secondary | ICD-10-CM | POA: Diagnosis not present

## 2019-04-04 DIAGNOSIS — L97819 Non-pressure chronic ulcer of other part of right lower leg with unspecified severity: Secondary | ICD-10-CM | POA: Diagnosis not present

## 2019-04-25 DIAGNOSIS — I87311 Chronic venous hypertension (idiopathic) with ulcer of right lower extremity: Secondary | ICD-10-CM | POA: Diagnosis not present

## 2019-04-25 DIAGNOSIS — I87301 Chronic venous hypertension (idiopathic) without complications of right lower extremity: Secondary | ICD-10-CM | POA: Diagnosis not present

## 2019-04-25 DIAGNOSIS — L97819 Non-pressure chronic ulcer of other part of right lower leg with unspecified severity: Secondary | ICD-10-CM | POA: Diagnosis not present

## 2019-05-03 LAB — HM DIABETES EYE EXAM

## 2019-05-09 DIAGNOSIS — I872 Venous insufficiency (chronic) (peripheral): Secondary | ICD-10-CM | POA: Diagnosis not present

## 2019-05-09 DIAGNOSIS — L97812 Non-pressure chronic ulcer of other part of right lower leg with fat layer exposed: Secondary | ICD-10-CM | POA: Diagnosis not present

## 2019-05-09 DIAGNOSIS — E119 Type 2 diabetes mellitus without complications: Secondary | ICD-10-CM | POA: Diagnosis not present

## 2019-05-21 DIAGNOSIS — I87311 Chronic venous hypertension (idiopathic) with ulcer of right lower extremity: Secondary | ICD-10-CM | POA: Diagnosis not present

## 2019-05-21 DIAGNOSIS — I87301 Chronic venous hypertension (idiopathic) without complications of right lower extremity: Secondary | ICD-10-CM | POA: Diagnosis not present

## 2019-05-21 DIAGNOSIS — L97819 Non-pressure chronic ulcer of other part of right lower leg with unspecified severity: Secondary | ICD-10-CM | POA: Diagnosis not present

## 2019-05-22 ENCOUNTER — Other Ambulatory Visit: Payer: Self-pay

## 2019-05-22 MED ORDER — LISINOPRIL 40 MG PO TABS: 40.0000 mg | ORAL_TABLET | Freq: Two times a day (BID) | ORAL | 1 refills | Status: DC

## 2019-06-05 ENCOUNTER — Other Ambulatory Visit: Payer: Self-pay | Admitting: Family Medicine

## 2019-06-13 ENCOUNTER — Other Ambulatory Visit: Payer: Self-pay

## 2019-06-13 ENCOUNTER — Encounter: Payer: Self-pay | Admitting: Family Medicine

## 2019-06-13 ENCOUNTER — Ambulatory Visit (INDEPENDENT_AMBULATORY_CARE_PROVIDER_SITE_OTHER): Payer: Medicare Other | Admitting: Family Medicine

## 2019-06-13 VITALS — BP 144/68 | HR 84 | Temp 97.5°F | Resp 18 | Ht 69.0 in | Wt 316.0 lb

## 2019-06-13 DIAGNOSIS — M25511 Pain in right shoulder: Secondary | ICD-10-CM

## 2019-06-13 DIAGNOSIS — E782 Mixed hyperlipidemia: Secondary | ICD-10-CM | POA: Diagnosis not present

## 2019-06-13 DIAGNOSIS — E1169 Type 2 diabetes mellitus with other specified complication: Secondary | ICD-10-CM | POA: Diagnosis not present

## 2019-06-13 DIAGNOSIS — M25562 Pain in left knee: Secondary | ICD-10-CM

## 2019-06-13 DIAGNOSIS — E1144 Type 2 diabetes mellitus with diabetic amyotrophy: Secondary | ICD-10-CM

## 2019-06-13 DIAGNOSIS — M25512 Pain in left shoulder: Secondary | ICD-10-CM

## 2019-06-13 DIAGNOSIS — E785 Hyperlipidemia, unspecified: Secondary | ICD-10-CM

## 2019-06-13 DIAGNOSIS — G8929 Other chronic pain: Secondary | ICD-10-CM

## 2019-06-13 DIAGNOSIS — M545 Low back pain: Secondary | ICD-10-CM

## 2019-06-13 DIAGNOSIS — M25561 Pain in right knee: Secondary | ICD-10-CM

## 2019-06-13 MED ORDER — PREGABALIN 75 MG PO CAPS: 75.0000 mg | ORAL_CAPSULE | Freq: Three times a day (TID) | ORAL | 0 refills | Status: DC

## 2019-06-13 NOTE — Patient Instructions (Addendum)
START LYRICA 75 MG ONE THREE TIMES A DAY.  WORK ON EATING HEALTHY.   Diabetes Basics  Diabetes (diabetes mellitus) is a long-term (chronic) disease. It occurs when the body does not properly use sugar (glucose) that is released from food after you eat. Diabetes may be caused by one or both of these problems:  Your pancreas does not make enough of a hormone called insulin.  Your body does not react in a normal way to insulin that it makes. Insulin lets sugars (glucose) go into cells in your body. This gives you energy. If you have diabetes, sugars cannot get into cells. This causes high blood sugar (hyperglycemia). How do I manage my blood sugar? Check your blood sugar levels using a blood glucose monitor as directed by your doctor. Your doctor will set treatment goals for you. Generally, you should have these blood sugar levels:  Before meals (preprandial): 80-130 mg/dL (4.4-7.2 mmol/L).  After meals (postprandial): below 180 mg/dL (10 mmol/L).  A1c level: less than 7%. Write down the times that you will check your blood sugar levels: Blood sugar checks  Time: _______________ Notes: ___________________________________  Time: _______________ Notes: ___________________________________  Time: _______________ Notes: ___________________________________  Time: _______________ Notes: ___________________________________  Time: _______________ Notes: ___________________________________  Time: _______________ Notes: ___________________________________  What do I need to know about low blood sugar? Low blood sugar is called hypoglycemia. This is when blood sugar is at or below 70 mg/dL (3.9 mmol/L). Symptoms may include:  Feeling: ? Hungry. ? Worried or nervous (anxious). ? Sweaty and clammy. ? Confused. ? Dizzy. ? Sleepy. ? Sick to your stomach (nauseous).  Having: ? A fast heartbeat. ? A headache. ? A change in your vision. ? Tingling or no feeling (numbness) around the  mouth, lips, or tongue. ? Jerky movements that you cannot control (seizure).  Having trouble with: ? Moving (coordination). ? Sleeping. ? Passing out (fainting). ? Getting upset easily (irritability). Treating low blood sugar To treat low blood sugar, eat or drink something sugary right away. If you can think clearly and swallow safely, follow the 15:15 rule:  Take 15 grams of a fast-acting carb (carbohydrate). Talk with your doctor about how much you should take.  Some fast-acting carbs are: ? Sugar tablets (glucose pills). Take 3-4 glucose pills. ? 6-8 pieces of hard candy. ? 4-6 oz (120-150 mL) of fruit juice. ? 4-6 oz (120-150 mL) of regular (not diet) soda. ? 1 Tbsp (15 mL) honey or sugar.  Check your blood sugar 15 minutes after you take the carb.  If your blood sugar is still at or below 70 mg/dL (3.9 mmol/L), take 15 grams of a carb again.  If your blood sugar does not go above 70 mg/dL (3.9 mmol/L) after 3 tries, get help right away.  After your blood sugar goes back to normal, eat a meal or a snack within 1 hour. Treating very low blood sugar If your blood sugar is at or below 54 mg/dL (3 mmol/L), you have very low blood sugar (severe hypoglycemia). This is an emergency. Do not wait to see if the symptoms will go away. Get medical help right away. Call your local emergency services (911 in the [place] Do not drive yourself to the hospital. Questions to ask your health care provider  Do I need to meet with a diabetes educator?  What equipment will I need to care for myself at home?  What diabetes medicines do I need? When should I take them?  How often  do I need to check my blood sugar?  What number can I call if I have questions?  When is my next doctor's visit?  Where can I find a support group for people with diabetes? Where to find more information  American Diabetes Association: www.diabetes.org  American Association of Diabetes Educators:  www.diabeteseducator.org/patient-resources Contact a doctor if:  Your blood sugar is at or above 240 mg/dL (13.3 mmol/L) for 2 days in a row.  You have been sick or have had a fever for 2 days or more, and you are not getting better.  You have any of these problems for more than 6 hours: ? You cannot eat or drink. ? You feel sick to your stomach (nauseous). ? You throw up (vomit). ? You have watery poop (diarrhea). Get help right away if:  Your blood sugar is lower than 54 mg/dL (3 mmol/L).  You get confused.  You have trouble: ? Thinking clearly. ? Breathing. Summary  Diabetes (diabetes mellitus) is a long-term (chronic) disease. It occurs when the body does not properly use sugar (glucose) that is released from food after digestion.  Take insulin and diabetes medicines as told.  Check your blood sugar every day, as often as told.  Keep all follow-up visits as told by your doctor. This is important. This information is not intended to replace advice given to you by your health care provider. Make sure you discuss any questions you have with your health care provider. Document Revised: 12/13/2018 Document Reviewed: 06/24/2017 Elsevier Patient Education  [place].

## 2019-06-13 NOTE — Progress Notes (Signed)
Subjective:  Patient ID: Christy Crawford, female    DOB: August 02, 1955  Age: 64 y.o. MRN: [id number]  Chief Complaint  Patient presents with  . Diabetes  . Hyperlipidemia    HPI Patient is a 64 year old white female who presents for chronic follow-up for her diabetes, hyperlipidemia and hypertension, however the patient is complaining of severe pain.  She states that she hurts all over.  She hurts to the left side of her back, in her muscles and her joints, particularly her hips, knees, shoulders and neck.  She reports that her hands ache and that she drops everything.  Her right hand goes numb at times.  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  She has seen a neurologist Dr. Krista Blue in years past, however, has not been there for some time due to an outstanding bill.  Given the choice as to which issue she wanted to address she chose to address her pain today.  We will do blood work and have her return for her other chronic issues.  The patient reports she has had numerous x-rays in the past but does not recall exactly what they showed.    Social History   Socioeconomic History  . Marital status: Married    Spouse name: Not on file  . Number of children: Not on file  . Years of education: Not on file  . Highest education level: Not on file  Occupational History  . Not on file  Tobacco Use  . Smoking status: Former Smoker    Types: Cigarettes    Quit date: 2000    Years since quitting: 21.2  . Smokeless tobacco: Never Used  Substance and Sexual Activity  . Alcohol use: Not Currently  . Drug use: Never  . Sexual activity: Not on file  Other Topics Concern  . Not on file  Social History Narrative  . Not on file   Social Determinants of Health   Financial Resource Strain:   . Difficulty of Paying Living Expenses:   Food Insecurity:   . Worried About Charity fundraiser in the Last Year:   . Arboriculturist in the Last Year:   Transportation  Needs:   . Film/video editor (Medical):   Marland Kitchen Lack of Transportation (Non-Medical):   Physical Activity:   . Days of Exercise per Week:   . Minutes of Exercise per Session:   Stress:   . Feeling of Stress :   Social Connections:   . Frequency of Communication with Friends and Family:   . Frequency of Social Gatherings with Friends and Family:   . Attends Religious Services:   . Active Member of Clubs or Organizations:   . Attends Archivist Meetings:   Marland Kitchen Marital Status:    Past Medical History:  Diagnosis Date  . Chronic ulcer of right calf ([place])   . Diabetes mellitus without complication ([place])   . Fibromyalgia   . Generalized edema   . Hyperlipidemia   . Migraine headache   . Sleep apnea   . Telogen effluvium    Family History  Family history unknown: Yes    Review of Systems  Constitutional: Positive for fatigue. Negative for chills and fever.  HENT: Negative for congestion, ear pain and sore throat.   Respiratory: Negative for cough and shortness of breath.   Cardiovascular: Negative for chest pain.  Gastrointestinal: Negative for abdominal pain.  Musculoskeletal: Positive for arthralgias,  back pain, gait problem, myalgias and neck pain.  Psychiatric/Behavioral: Positive for dysphoric mood.     Objective:  BP (!) 144/68   Pulse 84   Temp (!) 97.5 F (36.4 C)   Resp 18   Ht 5\' 9"  (1.753 m)   Wt (!) 316 lb (143.3 kg)   BMI 46.67 kg/m   BP/Weight 0000000  Systolic BP 123456  Diastolic BP 68  Wt. (Lbs) 316  BMI 46.67    Physical Exam Constitutional:      Appearance: Normal appearance. She is obese.  Cardiovascular:     Rate and Rhythm: Normal rate and regular rhythm.     Heart sounds: Normal heart sounds.  Pulmonary:     Effort: Pulmonary effort is normal. No respiratory distress.     Breath sounds: Normal breath sounds.  Musculoskeletal:     Right shoulder: Tenderness present. No swelling. Decreased range of motion.     Left shoulder:  Tenderness present. No swelling. Decreased range of motion.     Right hand: Tenderness present. No swelling.     Left hand: Tenderness present. No swelling.     Cervical back: Tenderness present.     Thoracic back: Normal.     Lumbar back: Tenderness present.       Back:     Right knee: No swelling. Tenderness present.     Left knee: No swelling. Tenderness present.   Gait is very slow and moans with every movement. Her speech is very slow (it has been this way as long as I have cared for her.)  Lab Results  Component Value Date   WBC 7.8 06/13/2019   HGB 14.1 06/13/2019   HCT 42.6 06/13/2019   PLT 209 06/13/2019   GLUCOSE 134 (H) 06/13/2019   CHOL 145 06/13/2019   TRIG 133 06/13/2019   HDL 58 06/13/2019   LDLCALC 64 06/13/2019   ALT 23 06/13/2019   AST 32 06/13/2019   NA 139 06/13/2019   K 4.8 06/13/2019   CL 101 06/13/2019   CREATININE 0.79 06/13/2019   BUN 17 06/13/2019   CO2 24 06/13/2019   TSH 4.060 06/13/2019   HGBA1C 8.7 (H) 06/13/2019      Assessment & Plan:  1. Dyslipidemia associated with type 2 diabetes mellitus (HCC) Low fat and diabetic diet. Discuss at next visit. - Lipid panel  2. 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 - pregabalin (LYRICA) 75 MG capsule; Take 1 capsule (75 mg total) by mouth 3 (three) times daily.  Dispense: 90 capsule; Refill: 0 - Hemoglobin A1c - TSH  3. Mixed hyperlipidemia Low fat diet. Discuss at next visit. - CBC with Differential/Platelet - Comprehensive metabolic panel  4. Chronic right-sided low back pain without sciatica Start lyrica. 5. Chronic pain of both shoulders Review xras  6. Chronic pain of left knee Review xrays  7. Chronic pain of right knee Review xrays   Meds ordered this encounter  Medications  . pregabalin (LYRICA) 75 MG capsule    Sig: Take 1 capsule (75 mg total) by mouth 3 (three) times daily.    Dispense:  90 capsule    Refill:  0        Follow-up: Return in about 4 weeks (around 07/11/2019).  AVS was given to patient prior to departure.  [name] [facility] [phone]

## 2019-06-14 LAB — LIPID PANEL
Chol/HDL Ratio: 2.5 ratio (ref 0.0–4.4)
Cholesterol, Total: 145 mg/dL (ref 100–199)
HDL: 58 mg/dL (ref >39)
LDL Chol Calc (NIH): 64 mg/dL (ref 0–99)
Triglycerides: 133 mg/dL (ref 0–149)
VLDL Cholesterol Cal: 23 mg/dL (ref 5–40)

## 2019-06-14 LAB — COMPREHENSIVE METABOLIC PANEL
ALT: 23 IU/L (ref 0–32)
AST: 32 IU/L (ref 0–40)
Albumin/Globulin Ratio: 2 (ref 1.2–2.2)
Albumin: 4.2 g/dL (ref 3.8–4.8)
Alkaline Phosphatase: 102 IU/L (ref 39–117)
BUN/Creatinine Ratio: 22 (ref 12–28)
BUN: 17 mg/dL (ref 8–27)
Bilirubin Total: 0.3 mg/dL (ref 0.0–1.2)
CO2: 24 mmol/L (ref 20–29)
Calcium: 10.3 mg/dL (ref 8.7–10.3)
Chloride: 101 mmol/L (ref 96–106)
Creatinine, Ser: 0.79 mg/dL (ref 0.57–1.00)
GFR calc Af Amer: 91 mL/min/{1.73_m2} (ref >59)
GFR calc non Af Amer: 79 mL/min/{1.73_m2} (ref >59)
Globulin, Total: 2.1 g/dL (ref 1.5–4.5)
Glucose: 134 mg/dL — ABNORMAL HIGH (ref 65–99)
Potassium: 4.8 mmol/L (ref 3.5–5.2)
Sodium: 139 mmol/L (ref 134–144)
Total Protein: 6.3 g/dL (ref 6.0–8.5)

## 2019-06-14 LAB — CBC WITH DIFFERENTIAL/PLATELET
Basophils Absolute: 0.1 10*3/uL (ref 0.0–0.2)
Basos: 1 %
EOS (ABSOLUTE): 0.4 10*3/uL (ref 0.0–0.4)
Eos: 5 %
Hematocrit: 42.6 % (ref 34.0–46.6)
Hemoglobin: 14.1 g/dL (ref 11.1–15.9)
Immature Grans (Abs): 0.1 10*3/uL (ref 0.0–0.1)
Immature Granulocytes: 1 %
Lymphocytes Absolute: 1.2 10*3/uL (ref 0.7–3.1)
Lymphs: 16 %
MCH: 29.2 pg (ref 26.6–33.0)
MCHC: 33.1 g/dL (ref 31.5–35.7)
MCV: 88 fL (ref 79–97)
Monocytes Absolute: 0.6 10*3/uL (ref 0.1–0.9)
Monocytes: 7 %
Neutrophils Absolute: 5.5 10*3/uL (ref 1.4–7.0)
Neutrophils: 70 %
Platelets: 209 10*3/uL (ref 150–450)
RBC: 4.83 x10E6/uL (ref 3.77–5.28)
RDW: 13.3 % (ref 11.7–15.4)
WBC: 7.8 10*3/uL (ref 3.4–10.8)

## 2019-06-14 LAB — CARDIOVASCULAR RISK ASSESSMENT

## 2019-06-14 LAB — TSH: TSH: 4.06 u[IU]/mL (ref 0.450–4.500)

## 2019-06-14 LAB — HEMOGLOBIN A1C
Est. average glucose Bld gHb Est-mCnc: 203 mg/dL
Hgb A1c MFr Bld: 8.7 % — ABNORMAL HIGH (ref 4.8–5.6)

## 2019-06-21 ENCOUNTER — Encounter: Payer: Self-pay | Admitting: Family Medicine

## 2019-06-21 DIAGNOSIS — E1144 Type 2 diabetes mellitus with diabetic amyotrophy: Secondary | ICD-10-CM | POA: Insufficient documentation

## 2019-06-21 DIAGNOSIS — G8929 Other chronic pain: Secondary | ICD-10-CM | POA: Insufficient documentation

## 2019-06-21 DIAGNOSIS — M25512 Pain in left shoulder: Secondary | ICD-10-CM | POA: Insufficient documentation

## 2019-06-25 ENCOUNTER — Other Ambulatory Visit: Payer: Self-pay | Admitting: Family Medicine

## 2019-06-25 ENCOUNTER — Telehealth: Payer: Self-pay

## 2019-06-25 NOTE — Telephone Encounter (Signed)
LM for patient to return call and schedule overdue Medicare AWV

## 2019-07-03 ENCOUNTER — Telehealth: Payer: Self-pay

## 2019-07-03 MED ORDER — GLIMEPIRIDE 4 MG PO TABS: 4.0000 mg | ORAL_TABLET | Freq: Every day | ORAL | 0 refills | Status: DC

## 2019-07-03 MED ORDER — OXYBUTYNIN CHLORIDE 5 MG PO TABS: 5.0000 mg | ORAL_TABLET | Freq: Three times a day (TID) | ORAL | 0 refills | Status: DC

## 2019-07-09 ENCOUNTER — Encounter: Payer: Self-pay | Admitting: Family Medicine

## 2019-07-09 NOTE — Progress Notes (Signed)
Established Patient Office Visit  Subjective:  Patient ID: Christy Crawford, female    DOB: Dec 22, 1955  Age: 64 y.o. MRN: [id number]  CC:  Chief Complaint  Patient presents with  . Arthritis    Generalized Joint Pain due to her "condition"  . Neurologic Amyotrophy    HPI SUCCESS DELLES presents for follow up of pain. Lyrica has helped some, but not enough. She rates her pain as 7/10. This is better than 10/10 last time. She has fibromyalgia, diabetic amyotrophic myelopathy, and arthritis.  Patient has returned to the wound clinic and is seeing Dr. Jerilee Hoh. She was wearing an Haematologist.  The wound was on the right leg has healed.  She would like to do this at home if her wound opens up again. She has some supplies and she started wrapping it with zinc oxide, cortisone 10, and triple antibiotic ointment. Using sterile 4x4 pads and then wrapping with guaze and leave for 5 days. She is wearing compression stockings.  Diabetes - increased tresiba to 80 U daily. Sugars ranging from 85 (fasting) to 179 (postprandial.) Sugars improved. Eating healthy. Not exercising due to her pain. She has difficulty checking her feet, but family helps her.   Hyperlipidemia was at goal. Pt is taking rosuvastatin.   Past Medical History:  Diagnosis Date  . Chronic ulcer of right calf ([place])   . Diabetes mellitus without complication ([place])   . 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 Essential hypertension   . Fibromyalgia   . Generalized edema   . Hyperlipidemia   . Hypertension   . Major depression   . Migraine headache   . Mixed hyperlipidemia   . Sleep apnea   . Telogen effluvium     Past Surgical History:  Procedure Laterality Date  . CHOLECYSTECTOMY    . lens implants Bilateral     Family History  Adopted: Yes  Problem Relation Age of Onset  . Diabetes Maternal Aunt     Social History   Socioeconomic History  .  Marital status: Married    Spouse name: Not on file  . Number of children: 1  . Years of education: Not on file  . Highest education level: Not on file  Occupational History  . Occupation: disabled  Tobacco Use  . Smoking status: Former Smoker    Types: Cigarettes    Quit date: 2000    Years since quitting: 21.2  . Smokeless tobacco: Never Used  Substance and Sexual Activity  . Alcohol use: Yes    Alcohol/week: 1.0 standard drinks    Types: 1 Glasses of wine per week    Comment: socially  . Drug use: Never  . Sexual activity: Not on file  Other Topics Concern  . Not on file  Social History Narrative  . Not on file   Social Determinants of Health   Financial Resource Strain:   . Difficulty of Paying Living Expenses:   Food Insecurity:   . Worried About Charity fundraiser in the Last Year:   . Arboriculturist in the Last Year:   Transportation Needs:   . Film/video editor (Medical):   Marland Kitchen Lack of Transportation (Non-Medical):   Physical Activity:   . Days of Exercise per Week:   . Minutes of Exercise per Session:   Stress:   . Feeling of Stress :   Social Connections:   . Frequency of  Communication with Friends and Family:   . Frequency of Social Gatherings with Friends and Family:   . Attends Religious Services:   . Active Member of Clubs or Organizations:   . Attends Archivist Meetings:   Marland Kitchen Marital Status:   Intimate Partner Violence:   . Fear of Current or Ex-Partner:   . Emotionally Abused:   Marland Kitchen Physically Abused:   . Sexually Abused:     Outpatient Medications Prior to Visit  Medication Sig Dispense Refill  . Dulaglutide (TRULICITY) A999333 0000000 SOPN Inject 0.75 each into the skin once a week. X 2 weeks. Samples given.    Marland Kitchen acetaminophen (TYLENOL) 325 MG tablet Take 325 mg by mouth every 6 (six) hours as needed.    . ALPRAZolam (XANAX) 1 MG tablet Take by mouth at bedtime as needed.     Marland Kitchen amitriptyline (ELAVIL) 25 MG tablet TAKE ONE TABLET  BY MOUTH AT BEDTIME 30 tablet 3  . COMFORT EZ PEN NEEDLES 31G X 5 MM MISC     . glimepiride (AMARYL) 4 MG tablet Take 1 tablet (4 mg total) by mouth daily. 90 tablet 0  . lisinopril (ZESTRIL) 40 MG tablet Take 1 tablet (40 mg total) by mouth in the morning and at bedtime. 180 tablet 1  . Melatonin 1 MG TABS Take 5 mg by mouth at bedtime.    Marland Kitchen oxybutynin (DITROPAN) 5 MG tablet Take 1 tablet (5 mg total) by mouth 3 (three) times daily. 270 tablet 0  . rosuvastatin (CRESTOR) 10 MG tablet     . sertraline (ZOLOFT) 100 MG tablet Take 100 mg by mouth 2 (two) times daily.     . TRESIBA FLEXTOUCH 100 UNIT/ML SOPN FlexTouch Pen INJECT 75 UNITS SUBQ ONCE DAILY AT BEDTIME (Patient taking differently: Inject 80 Units into the skin at bedtime. ) 23 pen 0  . Vitamin D, Cholecalciferol, 25 MCG (1000 UT) CAPS Take by mouth.    . pregabalin (LYRICA) 75 MG capsule TAKE 1 CAPSULE BY MOUTH 3 TIMES DAILY. 90 capsule 0   No facility-administered medications prior to visit.      ROS Review of Systems  Constitutional: Negative for chills, fatigue and fever.  HENT: Negative for congestion, ear pain, rhinorrhea and sore throat.   Respiratory: Positive for shortness of breath. Negative for cough.   Cardiovascular: Negative for chest pain.  Gastrointestinal: Negative for abdominal pain, constipation, diarrhea, nausea and vomiting.  Endocrine: Negative for polydipsia and polyphagia.  Genitourinary: Negative for dysuria and urgency.       Patient has stress incontinence and urge incontinence. She works on getting to the bathroom sooner. Oxybutynin helps.   Musculoskeletal: Positive for back pain and myalgias. Negative for arthralgias.  Neurological: Positive for weakness. Negative for dizziness, light-headedness and headaches.  Psychiatric/Behavioral: Positive for dysphoric mood. The patient is not nervous/anxious.       Objective:    Physical Exam  Constitutional: She appears well-developed and  well-nourished.  Cardiovascular: Normal rate, regular rhythm and normal heart sounds.  Pulmonary/Chest: Effort normal and breath sounds normal.  Abdominal: Soft. Bowel sounds are normal. There is no abdominal tenderness.  Musculoskeletal:        General: Tenderness and edema present.     Comments: Compression stockings are on.  All FM trigger points are positive.   Neurological: She is alert.  Psychiatric: She has a normal mood and affect. Her behavior is normal.    BP (!) 174/88 (BP Location: Right Arm, Patient Position: Sitting)  Pulse (!) 131   Temp 98.2 F (36.8 C) (Temporal)   Ht 6' (1.829 m)   Wt (!) 318 lb (144.2 kg)   SpO2 93%   BMI 43.13 kg/m  Wt Readings from Last 3 Encounters:  07/11/19 (!) 318 lb (144.2 kg)  06/13/19 (!) 316 lb (143.3 kg)     Health Maintenance Due  Topic Date Due  . Hepatitis C Screening  Never done  . FOOT EXAM  Never done  . HIV Screening  Never done  . TETANUS/TDAP  Never done  . PAP SMEAR-Modifier  Never done  . COLONOSCOPY  Never done  . OPHTHALMOLOGY EXAM  12/20/2018    There are no preventive care reminders to display for this patient.  Lab Results  Component Value Date   TSH 4.060 06/13/2019   Lab Results  Component Value Date   WBC 7.8 06/13/2019   HGB 14.1 06/13/2019   HCT 42.6 06/13/2019   MCV 88 06/13/2019   PLT 209 06/13/2019   Lab Results  Component Value Date   NA 139 06/13/2019   K 4.8 06/13/2019   CO2 24 06/13/2019   GLUCOSE 134 (H) 06/13/2019   BUN 17 06/13/2019   CREATININE 0.79 06/13/2019   BILITOT 0.3 06/13/2019   ALKPHOS 102 06/13/2019   AST 32 06/13/2019   ALT 23 06/13/2019   PROT 6.3 06/13/2019   ALBUMIN 4.2 06/13/2019   CALCIUM 10.3 06/13/2019   Lab Results  Component Value Date   CHOL 145 06/13/2019   Lab Results  Component Value Date   HDL 58 06/13/2019   Lab Results  Component Value Date   LDLCALC 64 06/13/2019   Lab Results  Component Value Date   TRIG 133 06/13/2019   Lab  Results  Component Value Date   CHOLHDL 2.5 06/13/2019   Lab Results  Component Value Date   HGBA1C 8.7 (H) 06/13/2019      Assessment & Plan:  1. 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 Recommend check sugars before meals and before bedtime. Recommend check feet daily. Recommend annual eye exams. Medicines: START TRULICITY A999333 MG ONCE WEEKLY X 2 WEEKS, THEN INCREASE TO 1.5 MG ONCE WEEKLY X 2 WEEKS. CONTINUE TRESIBA AT 80 U AT BEDTIME. IF SUGARS DECREASE LESS THAN 150, I WOULD RECOMMEND HOLD GLIMEPERIDE. REFER TO DIETICIAN IN OFFICE. REFER TO PHARMACIST IN THE OFFICE.   Continue to work on eating a healthy diet and exercise.  Labs drawn today.   - pregabalin (LYRICA) 150 MG capsule; Take 1 capsule (150 mg total) by mouth 3 (three) times daily.  Dispense: 90 capsule; Refill: 0  2. Fibromyalgia - pregabalin (LYRICA) 150 MG capsule; Take 1 capsule (150 mg total) by mouth 3 (three) times daily.  Dispense: 90 capsule; Refill: 0  3. Mixed hyperlipidemia Well controlled.  No changes to medicines.  Continue to work on eating a healthy diet and exercise.     4. Class 3 severe obesity due to excess calories with serious comorbidity and body mass index (BMI) of 40.0 to 44.9 in adult ([place]) REFERRAL TO DIETICIAN IN HOUSE.  REC DIABETIC, LOW FAT DIET, LOW SODIUM   Follow-up: Return in about 2 months (around 09/10/2019).    [name], MD

## 2019-07-11 ENCOUNTER — Ambulatory Visit (INDEPENDENT_AMBULATORY_CARE_PROVIDER_SITE_OTHER): Payer: Medicare Other | Admitting: Family Medicine

## 2019-07-11 ENCOUNTER — Encounter: Payer: Self-pay | Admitting: Family Medicine

## 2019-07-11 ENCOUNTER — Other Ambulatory Visit: Payer: Self-pay | Admitting: Family Medicine

## 2019-07-11 ENCOUNTER — Other Ambulatory Visit: Payer: Self-pay

## 2019-07-11 VITALS — BP 174/88 | HR 131 | Temp 98.2°F | Ht 72.0 in | Wt 318.0 lb

## 2019-07-11 DIAGNOSIS — E1144 Type 2 diabetes mellitus with diabetic amyotrophy: Secondary | ICD-10-CM

## 2019-07-11 DIAGNOSIS — E119 Type 2 diabetes mellitus without complications: Secondary | ICD-10-CM | POA: Diagnosis not present

## 2019-07-11 DIAGNOSIS — M797 Fibromyalgia: Secondary | ICD-10-CM | POA: Diagnosis not present

## 2019-07-11 DIAGNOSIS — E782 Mixed hyperlipidemia: Secondary | ICD-10-CM | POA: Diagnosis not present

## 2019-07-11 DIAGNOSIS — Z6841 Body Mass Index (BMI) 40.0 and over, adult: Secondary | ICD-10-CM

## 2019-07-11 DIAGNOSIS — Z794 Long term (current) use of insulin: Secondary | ICD-10-CM | POA: Diagnosis not present

## 2019-07-11 MED ORDER — TRULICITY 1.5 MG/0.5ML ~~LOC~~ SOAJ: 1.5000 mL | SUBCUTANEOUS | 0 refills | Status: DC

## 2019-07-11 MED ORDER — PREGABALIN 150 MG PO CAPS: 150.0000 mg | ORAL_CAPSULE | Freq: Three times a day (TID) | ORAL | 0 refills | Status: DC

## 2019-07-11 MED ORDER — TRULICITY 0.75 MG/0.5ML ~~LOC~~ SOAJ: 0.7500 | SUBCUTANEOUS | 0 refills | Status: DC

## 2019-07-11 NOTE — Patient Instructions (Signed)
1) INCREASE LYRICA TO 150 MG ONE THREE TIMES A DAY 2) START TRULICITY A999333 MG ONCE WEEKLY X 2 WEEKS, THEN INCREASE TO 1.5 MG ONCE WEEKLY X 2 WEEKS.  3) REFER TO DIETICIAN IN OFFICE. 4) REFER TO PHARMACIST IN THE OFFICE.

## 2019-07-12 ENCOUNTER — Telehealth: Payer: Self-pay

## 2019-07-12 NOTE — Telephone Encounter (Signed)
Medications sent to Dr. Tobie Poet for refills.

## 2019-07-12 NOTE — Telephone Encounter (Signed)
Tried reaching out to patient today in hopes of getting her to come in April 28 to meet with the diabetes educator.  No answer/no voicemail available.  I will continue to get her on the phone.

## 2019-07-16 ENCOUNTER — Other Ambulatory Visit: Payer: Self-pay | Admitting: Family Medicine

## 2019-07-19 ENCOUNTER — Telehealth: Payer: Self-pay | Admitting: Family Medicine

## 2019-07-19 NOTE — Progress Notes (Signed)
  Chronic Care Management   Outreach Note  07/19/2019 Name: Christy Crawford MRN: [id number] DOB: 1955/11/30  Referred by: [name], MD Reason for referral : No chief complaint on file.   An unsuccessful telephone outreach was attempted today. The patient was referred to the pharmacist for assistance with care management and care coordination.   Follow Up Plan:   Earney Hamburg [facility] Scheduler

## 2019-08-01 ENCOUNTER — Other Ambulatory Visit: Payer: Self-pay | Admitting: Physician Assistant

## 2019-08-02 ENCOUNTER — Telehealth: Payer: Self-pay | Admitting: Family Medicine

## 2019-08-02 NOTE — Progress Notes (Signed)
  Chronic Care Management   Outreach Note  08/02/2019 Name: Christy Crawford MRN: [id number] DOB: 07-05-1955  Referred by: [name], MD Reason for referral : No chief complaint on file.   An unsuccessful telephone outreach was attempted today. The patient was referred to the pharmacist for assistance with care management and care coordination.   This note is not being shared with the patient for the following reason: To respect privacy (The patient or proxy has requested that the information not be shared).  Follow Up Plan:   Earney Hamburg Upstream Scheduler

## 2019-08-08 ENCOUNTER — Other Ambulatory Visit: Payer: Self-pay

## 2019-08-09 ENCOUNTER — Other Ambulatory Visit: Payer: Self-pay

## 2019-08-09 ENCOUNTER — Other Ambulatory Visit: Payer: Self-pay | Admitting: Family Medicine

## 2019-08-09 MED ORDER — TRULICITY 1.5 MG/0.5ML ~~LOC~~ SOAJ: 1.5000 mL | SUBCUTANEOUS | 0 refills | Status: DC

## 2019-08-13 ENCOUNTER — Telehealth: Payer: Self-pay

## 2019-08-13 NOTE — Telephone Encounter (Signed)
PA submitted for Trulicity 1.5mg /0.5 ml via covermymeds. Per [employer] [facility] medication is covered and does not require a PA.   Attempted to contact patient due to her leaving a voicemail today, phone just rings, no voicemail available.

## 2019-08-14 ENCOUNTER — Other Ambulatory Visit: Payer: Self-pay

## 2019-08-14 MED ORDER — TRULICITY 1.5 MG/0.5ML ~~LOC~~ SOAJ: 1.5000 mg | SUBCUTANEOUS | 0 refills | Status: DC

## 2019-08-27 ENCOUNTER — Other Ambulatory Visit: Payer: Self-pay | Admitting: Family Medicine

## 2019-09-10 ENCOUNTER — Other Ambulatory Visit: Payer: Self-pay | Admitting: Family Medicine

## 2019-09-11 ENCOUNTER — Ambulatory Visit: Payer: Medicare Other | Admitting: Family Medicine

## 2019-09-19 ENCOUNTER — Encounter: Payer: Self-pay | Admitting: Family Medicine

## 2019-09-19 ENCOUNTER — Other Ambulatory Visit: Payer: Self-pay

## 2019-09-19 ENCOUNTER — Ambulatory Visit (INDEPENDENT_AMBULATORY_CARE_PROVIDER_SITE_OTHER): Payer: Medicare Other | Admitting: Family Medicine

## 2019-09-19 VITALS — BP 142/76 | HR 103 | Temp 97.3°F | Ht 72.0 in

## 2019-09-19 DIAGNOSIS — E1144 Type 2 diabetes mellitus with diabetic amyotrophy: Secondary | ICD-10-CM

## 2019-09-19 DIAGNOSIS — E782 Mixed hyperlipidemia: Secondary | ICD-10-CM

## 2019-09-19 DIAGNOSIS — M797 Fibromyalgia: Secondary | ICD-10-CM | POA: Diagnosis not present

## 2019-09-19 DIAGNOSIS — R600 Localized edema: Secondary | ICD-10-CM

## 2019-09-19 DIAGNOSIS — Z6841 Body Mass Index (BMI) 40.0 and over, adult: Secondary | ICD-10-CM

## 2019-09-19 DIAGNOSIS — I70439 Atherosclerosis of autologous vein bypass graft(s) of the right leg with ulceration of unspecified site: Secondary | ICD-10-CM | POA: Diagnosis not present

## 2019-09-19 MED ORDER — PREGABALIN 300 MG PO CAPS
300.0000 mg | ORAL_CAPSULE | Freq: Two times a day (BID) | ORAL | 2 refills | Status: DC
Start: 2019-09-19 — End: 2019-11-20

## 2019-09-19 MED ORDER — FUROSEMIDE 20 MG PO TABS: 20.0000 mg | ORAL_TABLET | Freq: Every day | ORAL | 3 refills | Status: DC

## 2019-09-19 NOTE — Progress Notes (Signed)
Established Patient Office Visit  Subjective:  Patient ID: Christy Crawford, female    DOB: 23-Mar-1956  Age: 64 y.o. MRN: [id number]  CC:  Chief Complaint  Patient presents with  . Diabetes    Added trulicity 1.5 mg weekly and Tresiba 80 u  . Fibromyalgia    Added Lyrica 150 mg TID    HPI Christy Crawford presents for follow up of pain. Lyrica has helped some, but not enough. She rates her pain as 6/10. This is better than 10/10 last time. She has fibromyalgia, diabetic amyotrophic myelopathy, and arthritis.  Diabetes - increased tresiba to 80 U daily. Sugars ranging from 80 (fasting) to 110 (Sugars improved. Eating healthy. Not exercising due to her pain. She has difficulty checking her feet, but family helps her.     Past Medical History:  Diagnosis Date  . Chronic ulcer of right calf ([place])   . Diabetes mellitus without complication ([place])   . 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 Essential hypertension   . Fibromyalgia   . Generalized edema   . Hyperlipidemia   . Hypertension   . Major depression   . Migraine headache   . Mixed hyperlipidemia   . Sleep apnea   . Telogen effluvium     Past Surgical History:  Procedure Laterality Date  . CHOLECYSTECTOMY    . lens implants Bilateral     Family History  Adopted: Yes  Problem Relation Age of Onset  . Diabetes Maternal Aunt     Social History   Socioeconomic History  . Marital status: Married    Spouse name: Not on file  . Number of children: 1  . Years of education: Not on file  . Highest education level: Not on file  Occupational History  . Occupation: disabled  Tobacco Use  . Smoking status: Former Smoker    Types: Cigarettes    Quit date: 2000    Years since quitting: 21.4  . Smokeless tobacco: Never Used  Substance and Sexual Activity  . Alcohol use: Yes    Alcohol/week: 1.0 standard drink    Types: 1 Glasses of wine per week     Comment: socially  . Drug use: Never  . Sexual activity: Not on file  Other Topics Concern  . Not on file  Social History Narrative  . Not on file   Social Determinants of Health   Financial Resource Strain:   . Difficulty of Paying Living Expenses:   Food Insecurity:   . Worried About Charity fundraiser in the Last Year:   . Arboriculturist in the Last Year:   Transportation Needs:   . Film/video editor (Medical):   Marland Kitchen Lack of Transportation (Non-Medical):   Physical Activity:   . Days of Exercise per Week:   . Minutes of Exercise per Session:   Stress:   . Feeling of Stress :   Social Connections:   . Frequency of Communication with Friends and Family:   . Frequency of Social Gatherings with Friends and Family:   . Attends Religious Services:   . Active Member of Clubs or Organizations:   . Attends Archivist Meetings:   Marland Kitchen Marital Status:   Intimate Partner Violence:   . Fear of Current or Ex-Partner:   . Emotionally Abused:   Marland Kitchen Physically Abused:   . Sexually Abused:     Outpatient Medications Prior to Visit  Medication Sig Dispense Refill  . acetaminophen (TYLENOL) 325 MG tablet Take 325 mg by mouth every 6 (six) hours as needed.    . ALPRAZolam (XANAX) 1 MG tablet Take by mouth at bedtime as needed.     Marland Kitchen amitriptyline (ELAVIL) 25 MG tablet TAKE ONE TABLET BY MOUTH AT BEDTIME 30 tablet 3  . COMFORT EZ PEN NEEDLES 31G X 5 MM MISC     . glimepiride (AMARYL) 4 MG tablet Take 1 tablet (4 mg total) by mouth daily. 90 tablet 0  . lisinopril (ZESTRIL) 40 MG tablet Take 1 tablet (40 mg total) by mouth in the morning and at bedtime. 180 tablet 1  . Melatonin 1 MG TABS Take 5 mg by mouth at bedtime.    Marland Kitchen oxybutynin (DITROPAN) 5 MG tablet Take 1 tablet (5 mg total) by mouth 3 (three) times daily. 270 tablet 0  . rosuvastatin (CRESTOR) 10 MG tablet     . sertraline (ZOLOFT) 100 MG tablet Take 100 mg by mouth 2 (two) times daily.     . TRESIBA FLEXTOUCH 100  UNIT/ML FlexTouch Pen INJECT 80 UNITS SUBQ ONCE DAILY AT BEDTIME 30 mL 1  . TRULICITY 1.5 VZ/8.5YI SOPN INJECT 1.5MG  INTO THE SKIN ONCE A WEEK 2 mL 2  . Vitamin D, Cholecalciferol, 25 MCG (1000 UT) CAPS Take by mouth.    . pregabalin (LYRICA) 150 MG capsule Take 1 capsule (150 mg total) by mouth 3 (three) times daily. (Patient taking differently: Take 150 mg by mouth 3 (three) times daily. Patient has been taking it twice a day) 90 capsule 0  . pregabalin (LYRICA) 75 MG capsule TAKE 1 CAPSULE BY MOUTH 3 TIMES DAILY. 90 capsule 0   No facility-administered medications prior to visit.      ROS Review of Systems  Constitutional: Negative for chills, fatigue and fever.  HENT: Negative for congestion, ear pain, rhinorrhea and sore throat.   Respiratory: Negative for cough and shortness of breath.   Cardiovascular: Negative for chest pain.  Gastrointestinal: Negative for abdominal pain (sometimes).  Endocrine: Negative for polydipsia and polyphagia.  Genitourinary:       Patient has stress incontinence and urge incontinence. She works on getting to the bathroom sooner. Oxybutynin helps.   Musculoskeletal: Positive for back pain and myalgias. Negative for arthralgias.  Neurological: Positive for weakness. Negative for dizziness, light-headedness and headaches.      Objective:    Physical Exam Constitutional:      Appearance: She is well-developed.  Neck:     Vascular: No carotid bruit.  Cardiovascular:     Rate and Rhythm: Normal rate and regular rhythm.     Heart sounds: Normal heart sounds.  Pulmonary:     Effort: Pulmonary effort is normal.     Breath sounds: Normal breath sounds.  Abdominal:     General: Bowel sounds are normal.     Palpations: Abdomen is soft.     Tenderness: There is no abdominal tenderness.  Musculoskeletal:        General: Swelling (pedal BL. Weeping. ) and tenderness present.     Comments: Compression stockings are on.  All FM trigger points are  positive.   Neurological:     Mental Status: She is alert.  Psychiatric:        Behavior: Behavior normal.     BP (!) 142/76   Pulse (!) 103   Temp (!) 97.3 F (36.3 C)   Ht 6' (1.829 m)   SpO2 98%  BMI 43.13 kg/m  Wt Readings from Last 3 Encounters:  07/11/19 (!) 318 lb (144.2 kg)  06/13/19 (!) 316 lb (143.3 kg)     Health Maintenance Due  Topic Date Due  . Hepatitis C Screening  Never done  . FOOT EXAM  Never done  . COVID-19 Vaccine (1) Never done  . HIV Screening  Never done  . TETANUS/TDAP  Never done  . PAP SMEAR-Modifier  Never done  . COLONOSCOPY  Never done  . OPHTHALMOLOGY EXAM  12/20/2018  . DEXA SCAN  Never done  . PNA vac Low Risk Adult (1 of 2 - PCV13) Never done    There are no preventive care reminders to display for this patient.  Lab Results  Component Value Date   TSH 4.060 06/13/2019   Lab Results  Component Value Date   WBC 7.2 09/19/2019   HGB 14.6 09/19/2019   HCT 44.9 09/19/2019   MCV 89 09/19/2019   PLT 181 09/19/2019   Lab Results  Component Value Date   NA 141 09/19/2019   K 5.1 09/19/2019   CO2 24 09/19/2019   GLUCOSE 84 09/19/2019   BUN 17 09/19/2019   CREATININE 0.93 09/19/2019   BILITOT 0.3 09/19/2019   ALKPHOS 118 09/19/2019   AST 43 (H) 09/19/2019   ALT 31 09/19/2019   PROT 7.0 09/19/2019   ALBUMIN 4.7 09/19/2019   CALCIUM 10.6 (H) 09/19/2019   Lab Results  Component Value Date   CHOL 165 09/19/2019   Lab Results  Component Value Date   HDL 62 09/19/2019   Lab Results  Component Value Date   LDLCALC 79 09/19/2019   Lab Results  Component Value Date   TRIG 140 09/19/2019   Lab Results  Component Value Date   CHOLHDL 2.7 09/19/2019   Lab Results  Component Value Date   HGBA1C 7.3 (H) 09/19/2019      Assessment & Plan:  1. 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 Recommend check sugars before meals and before bedtime. Recommend check  feet daily. Medicines: TRULICITY 1.5 MG ONCE WEEKLY. CONTINUE TRESIBA AT 80 U AT BEDTIME. IF SUGARS DECREASE LESS THAN 150, I WOULD RECOMMEND HOLD GLIMEPERIDE.   2. Fibromyalgia The current medical regimen is effective;  continue present plan and medications. - pregabalin (LYRICA) 150 MG capsule; Take 1 capsule (150 mg total) by mouth 3 (three) times daily.  Dispense: 90 capsule; Refill: 0  4. Class 3 severe obesity due to excess calories with serious comorbidity and body mass index (BMI) of 40.0 to 44.9 in adult ([place]) REFERRAL TO DIETICIAN IN HOUSE.  REC DIABETIC, LOW FAT DIET, LOW SODIUM  4. Mixed hyperlipidemia The current medical regimen is effective;  continue present plan and medications. Recommend low fat diet Labs ordered: - Lipid panel  5. Pedal edema Patient was going to wound care, but it is horribly expensive. Recommend return for unna boots next week. - Comprehensive metabolic panel - CBC with Differential/Platelet  Follow-up: Return in about 3 months (around 12/20/2019) for fasting. [name], MD

## 2019-09-19 NOTE — Patient Instructions (Signed)
Increase Lyrica to 300 mg one twice a day.  Start on Lasix 20 mg once daily. Recommend low salt diet. Elevate legs if possible  Continue oxybutynin.  Recommend kegel exercises and bladder drills.   Kegel Exercises  Kegel exercises can help strengthen your pelvic floor muscles. The pelvic floor is a group of muscles that support your rectum, small intestine, and bladder. In females, pelvic floor muscles also help support the womb (uterus). These muscles help you control the flow of urine and stool. Kegel exercises are painless and simple, and they do not require any equipment. Your provider may suggest Kegel exercises to:  Improve bladder and bowel control.  Improve sexual response.  Improve weak pelvic floor muscles after surgery to remove the uterus (hysterectomy) or pregnancy (females).  Improve weak pelvic floor muscles after prostate gland removal or surgery (males). Kegel exercises involve squeezing your pelvic floor muscles, which are the same muscles you squeeze when you try to stop the flow of urine or keep from passing gas. The exercises can be done while sitting, standing, or lying down, but it is best to vary your position. Exercises How to do Kegel exercises: 1. Squeeze your pelvic floor muscles tight. You should feel a tight lift in your rectal area. If you are a female, you should also feel a tightness in your vaginal area. Keep your stomach, buttocks, and legs relaxed. 2. Hold the muscles tight for up to 10 seconds. 3. Breathe normally. 4. Relax your muscles. 5. Repeat as told by your health care provider. Repeat this exercise daily as told by your health care provider. Continue to do this exercise for at least 4-6 weeks, or for as long as told by your health care provider. You may be referred to a physical therapist who can help you learn more about how to do Kegel exercises. Depending on your condition, your health care provider may recommend:  Varying how long you  squeeze your muscles.  Doing several sets of exercises every day.  Doing exercises for several weeks.  Making Kegel exercises a part of your regular exercise routine. This information is not intended to replace advice given to you by your health care provider. Make sure you discuss any questions you have with your health care provider. Document Revised: 11/09/2017 Document Reviewed: 11/09/2017 Elsevier Patient Education  [place].

## 2019-09-20 ENCOUNTER — Telehealth: Payer: Self-pay | Admitting: Family Medicine

## 2019-09-20 LAB — CARDIOVASCULAR RISK ASSESSMENT

## 2019-09-20 LAB — COMPREHENSIVE METABOLIC PANEL
ALT: 31 IU/L (ref 0–32)
AST: 43 IU/L — ABNORMAL HIGH (ref 0–40)
Albumin/Globulin Ratio: 2 (ref 1.2–2.2)
Albumin: 4.7 g/dL (ref 3.8–4.8)
Alkaline Phosphatase: 118 IU/L (ref 48–121)
BUN/Creatinine Ratio: 18 (ref 12–28)
BUN: 17 mg/dL (ref 8–27)
Bilirubin Total: 0.3 mg/dL (ref 0.0–1.2)
CO2: 24 mmol/L (ref 20–29)
Calcium: 10.6 mg/dL — ABNORMAL HIGH (ref 8.7–10.3)
Chloride: 101 mmol/L (ref 96–106)
Creatinine, Ser: 0.93 mg/dL (ref 0.57–1.00)
GFR calc Af Amer: 75 mL/min/{1.73_m2} (ref >59)
GFR calc non Af Amer: 65 mL/min/{1.73_m2} (ref >59)
Globulin, Total: 2.3 g/dL (ref 1.5–4.5)
Glucose: 84 mg/dL (ref 65–99)
Potassium: 5.1 mmol/L (ref 3.5–5.2)
Sodium: 141 mmol/L (ref 134–144)
Total Protein: 7 g/dL (ref 6.0–8.5)

## 2019-09-20 LAB — CBC WITH DIFFERENTIAL/PLATELET
Basophils Absolute: 0.1 10*3/uL (ref 0.0–0.2)
Basos: 1 %
EOS (ABSOLUTE): 0.2 10*3/uL (ref 0.0–0.4)
Eos: 3 %
Hematocrit: 44.9 % (ref 34.0–46.6)
Hemoglobin: 14.6 g/dL (ref 11.1–15.9)
Immature Grans (Abs): 0.1 10*3/uL (ref 0.0–0.1)
Immature Granulocytes: 1 %
Lymphocytes Absolute: 1.3 10*3/uL (ref 0.7–3.1)
Lymphs: 18 %
MCH: 29.1 pg (ref 26.6–33.0)
MCHC: 32.5 g/dL (ref 31.5–35.7)
MCV: 89 fL (ref 79–97)
Monocytes Absolute: 0.4 10*3/uL (ref 0.1–0.9)
Monocytes: 6 %
Neutrophils Absolute: 5.1 10*3/uL (ref 1.4–7.0)
Neutrophils: 71 %
Platelets: 181 10*3/uL (ref 150–450)
RBC: 5.02 x10E6/uL (ref 3.77–5.28)
RDW: 13.6 % (ref 11.7–15.4)
WBC: 7.2 10*3/uL (ref 3.4–10.8)

## 2019-09-20 LAB — LIPID PANEL
Chol/HDL Ratio: 2.7 ratio (ref 0.0–4.4)
Cholesterol, Total: 165 mg/dL (ref 100–199)
HDL: 62 mg/dL (ref >39)
LDL Chol Calc (NIH): 79 mg/dL (ref 0–99)
Triglycerides: 140 mg/dL (ref 0–149)
VLDL Cholesterol Cal: 24 mg/dL (ref 5–40)

## 2019-09-20 LAB — HEMOGLOBIN A1C
Est. average glucose Bld gHb Est-mCnc: 163 mg/dL
Hgb A1c MFr Bld: 7.3 % — ABNORMAL HIGH (ref 4.8–5.6)

## 2019-09-20 NOTE — Progress Notes (Signed)
  Chronic Care Management   Note  09/20/2019 Name: Christy Crawford MRN: [id number] DOB: 15-Aug-1955  Christy Crawford is a 64 y.o. year old female who is a primary care patient of Cox, Kirsten, MD. I reached out to Christy Crawford by phone today in response to a referral sent by Ms. Elberta Fortis PCP, [name], MD.   Ms. Kemler was given information about Chronic Care Management services today including:  1. CCM service includes personalized support from designated clinical staff supervised by her physician, including individualized plan of care and coordination with other care providers 2. 24/7 contact phone numbers for assistance for urgent and routine care needs. 3. Service will only be billed when office clinical staff spend 20 minutes or more in a month to coordinate care. 4. Only one practitioner may furnish and bill the service in a calendar month. 5. The patient may stop CCM services at any time (effective at the end of the month) by phone call to the office staff.   Patient agreed to services and verbal consent obtained.   Follow up plan:   Earney [facility] Upstream Scheduler

## 2019-09-23 DIAGNOSIS — R600 Localized edema: Secondary | ICD-10-CM | POA: Insufficient documentation

## 2019-09-24 ENCOUNTER — Other Ambulatory Visit: Payer: Self-pay | Admitting: Family Medicine

## 2019-10-09 ENCOUNTER — Other Ambulatory Visit: Payer: Self-pay | Admitting: Family Medicine

## 2019-10-10 DIAGNOSIS — Z794 Long term (current) use of insulin: Secondary | ICD-10-CM | POA: Diagnosis not present

## 2019-10-10 DIAGNOSIS — E119 Type 2 diabetes mellitus without complications: Secondary | ICD-10-CM | POA: Diagnosis not present

## 2019-10-11 NOTE — Chronic Care Management (AMB) (Deleted)
Chronic Care Management Pharmacy  Name: Christy Crawford  MRN: [id number] DOB: [birth year]  Chief Complaint/ HPI  Christy Crawford,  63 y.o. , female presents for their Initial CCM visit with the clinical pharmacist {CHL HP Upstream Pharm visit WCLX:[id number]}.  PCP : Blane Ohara, MD  Their chronic conditions include: Type 2 DM, Dyslipidemia, Chronic pain of soulders/knee, fibromyalgia, edema.  Office Visits:*** 09/19/2019 - Increase Lyrica to 300 mg bid. Start Lasix once daily. Elevate legs and low salt diet if possible. Continue oxybutynin but recommend kegel exercises/bladder drills.  07/11/2019 - Increased Tresiba to 80 units daily. Sugars improved. She is wrapping wound and using compression hose. Start Trulicity once weekly beginning with 0.75 mg and increasing to 1.5 after 2 weeks. Hold glimepiride if sugars decrease less than 150 mg/dL. Increase Lyrical to 150 mg tid.  06/13/2019 -  Pain is main issue she wanted addressed. She hasn't followed up with neurologist in some time due to outstanding balance.  Started Lyrica 75 mg tid Consult Visit:***  Medications: Outpatient Encounter Medications as of 10/15/2019  Medication Sig  . acetaminophen (TYLENOL) 325 MG tablet Take 325 mg by mouth every 6 (six) hours as needed.  . ALPRAZolam (XANAX) 1 MG tablet Take by mouth at bedtime as needed.   Marland Kitchen amitriptyline (ELAVIL) 25 MG tablet TAKE ONE TABLET BY MOUTH AT BEDTIME  . COMFORT EZ PEN NEEDLES 31G X 5 MM MISC   . furosemide (LASIX) 20 MG tablet Take 1 tablet (20 mg total) by mouth daily.  Marland Kitchen glimepiride (AMARYL) 4 MG tablet TAKE 1 TABLET BY MOUTH ONCE DAILY.  Marland Kitchen lisinopril (ZESTRIL) 40 MG tablet Take 1 tablet (40 mg total) by mouth in the morning and at bedtime.  . Melatonin 1 MG TABS Take 5 mg by mouth at bedtime.  Marland Kitchen oxybutynin (DITROPAN) 5 MG tablet TAKE 1 TABLET BY MOUTH 3 TIMES DAILY.  Marland Kitchen pregabalin (LYRICA) 300 MG capsule Take 1 capsule (300 mg total) by mouth 2 (two) times daily.  .  rosuvastatin (CRESTOR) 10 MG tablet TAKE 1 TABLET BY MOUTH ONCE DAILY. **DUEFOR FOLLOW-UP OFFICE VISIT AND LABS, NO REFILLS UNTIL SEEN**  . sertraline (ZOLOFT) 100 MG tablet Take 100 mg by mouth 2 (two) times daily.   . TRESIBA FLEXTOUCH 100 UNIT/ML FlexTouch Pen INJECT 80 UNITS SUBQ ONCE DAILY AT BEDTIME  . TRULICITY 1.5 MG/0.5ML SOPN INJECT 1.5MG  INTO THE SKIN ONCE A WEEK  . Vitamin D, Cholecalciferol, 25 MCG (1000 UT) CAPS Take by mouth.  . [DISCONTINUED] rosuvastatin (CRESTOR) 10 MG tablet    No facility-administered encounter medications on file as of 10/15/2019.     Current Diagnosis/Assessment:  Goals Addressed   None    Hyperlipidemia   LDL goal < ***  Lipid Panel     Component Value Date/Time   CHOL 165 09/19/2019 1553   TRIG 140 09/19/2019 1553   HDL 62 09/19/2019 1553   LDLCALC 79 09/19/2019 1553    Hepatic Function Latest Ref Rng & Units 09/19/2019 06/13/2019  Total Protein 6.0 - 8.5 g/dL 7.0 6.3  Albumin 3.8 - 4.8 g/dL 4.7 4.2  AST 0 - 40 IU/L 43(H) 32  ALT 0 - 32 IU/L 31 23  Alk Phosphatase 48 - 121 IU/L 118 102  Total Bilirubin 0.0 - 1.2 mg/dL 0.3 0.3     The 85-LVWN ASCVD risk score Denman George DC Jr., et al., 2013) is: 14.6%   Values used to calculate the score:     Age: 74 years  Sex: Female     Is Non-Hispanic African American: No     Diabetic: Yes     Tobacco smoker: No     Systolic Blood Pressure: 010 mmHg     Is BP treated: Yes     HDL Cholesterol: 62 mg/dL     Total Cholesterol: 165 mg/dL   Patient has failed these meds in past: *** Patient is currently {CHL Controlled/Uncontrolled:[phone]} on the following medications:  . Rosuvastatin 10 mg daily We discussed:  {CHL HP Upstream Pharmacy [phone]}  Plan  Continue {CHL HP Upstream Pharmacy Plans:[phone]} Hypertension   BP today is:  {CHL HP UPSTREAM Pharmacist BP [phone]}  Office blood pressures are  BP Readings from Last 3 Encounters:  09/19/19 (!) 142/76    07/11/19 (!) 174/88  06/13/19 (!) 144/68    Patient has failed these meds in the past: *** Patient is currently controlled/uncontrolled*** on the following medications: furosemide 20 mg daily, lisinopril 40 mg bid Patient checks BP at home {CHL HP BP Monitoring Frequency:[phone]}  Patient home BP readings are ranging: ***  We discussed {[facility]L HP Upstream Pharmacy [phone]}  Plan  Continue {CHL [facility] Upstream Pharmacy Plans:[phone]}      Diabetes   Recent Relevant Labs: Lab Results  Component Value Date/Time   HGBA1C 7.3 (H) 09/19/2019 03:53 PM   HGBA1C 8.7 (H) 06/13/2019 10:33 AM   HGBA1C 7.5 02/12/2019 12:00 AM     Checking BG: {CHL HP Blood Glucose Monitoring Frequency:[phone]}  Recent FBG Readings: Recent pre-meal BG readings: *** Recent 2hr PP BG readings:  *** Recent HS BG readings: *** Patient has failed these meds in past: *** Patient is currently {CHL Controlled/Uncontrolled:[phone]} on the following medications: glimepiride 4 mg daily, tresiba 80 units qhs, trulicity 1.5 mg weekly, pen needles  Last diabetic Foot exam: No results found for: HMDIABEYEEXA  Last diabetic Eye exam: No results found for: HMDIABFOOTEX   We discussed: {CHL [facility] Upstream Pharmacy [phone]}  Plan  Continue {CHL [facility] Upstream Pharmacy Plans:[phone]} and  Other Diagnosis:Depression    Patient has failed these meds in past: *** Patient is currently {CHL Controlled/Uncontrolled:[phone]} on the following medications: alprazolam 1 mg qhs prn, amitriptyline 24 mg qhs, sertraline 100 mg bid We discussed:  {CHL HP Upstream Pharmacy [phone]}  Plan  Continue {CHL HP Upstream Pharmacy Plans:[phone]}  Pain   Patient has failed these meds in past: *** Patient is currently {CHL Controlled/Uncontrolled:[phone]} on the following medications:  . Acetaminophen 325 mg q6h prn  . Lyrica 300 mg bid  We discussed:  ***  Plan  Continue  {CHL [facility] Upstream Pharmacy Plans:[phone]} Osteopenia / Osteoporosis   Last DEXA Scan: ***   T-Score femoral neck: ***  T-Score total hip: ***  T-Score lumbar spine: ***  T-Score forearm radius: ***  10-year probability of major osteoporotic fracture: ***  10-year probability of hip fracture: ***  No results found for: VD25OH   Patient {is;is not an osteoporosis candidate:23886}  Patient has failed these meds in past: *** Patient is currently {CHL Controlled/Uncontrolled:[phone]} on the following medications:  Marland Kitchen Vitamin D 25 mcg (1000 unit) ***  We discussed:  {Osteoporosis Counseling:23892}  Plan  Continue {[facility]L [facility] Upstream Pharmacy Plans:[phone]}  Health Maintenance   Patient is currently {CHL Controlled/Uncontrolled:[phone]} on the following medications:  . Oxybutynin 5 mg tid . Melatonin 1 mg qhs  We discussed:  ***  Plan  Continue {CHL HP Upstream Pharmacy UVOZD:[id number]} Vaccines   Reviewed and discussed patient's vaccination history.     There  is no immunization history on file for this patient.  Plan  Recommended patient receive *** vaccine in *** office.  Medication Management   Pt uses [facility] for all medications Uses pill box? {Yes or If no, why not?:20788} Pt endorses ***% compliance  We discussed: ***  Plan  {[facility] PZWC:58527}    Follow up: *** month phone visit  ***

## 2019-10-15 ENCOUNTER — Ambulatory Visit: Payer: Medicare Other

## 2019-10-30 ENCOUNTER — Other Ambulatory Visit: Payer: Self-pay | Admitting: Family Medicine

## 2019-11-02 ENCOUNTER — Other Ambulatory Visit: Payer: Self-pay

## 2019-11-02 DIAGNOSIS — M797 Fibromyalgia: Secondary | ICD-10-CM

## 2019-11-02 DIAGNOSIS — E782 Mixed hyperlipidemia: Secondary | ICD-10-CM

## 2019-11-02 NOTE — Progress Notes (Signed)
Patient had an appt with Sherre Poot, PharmD.

## 2019-11-08 ENCOUNTER — Other Ambulatory Visit: Payer: Self-pay

## 2019-11-08 MED ORDER — CLEVER CHOICE MICRO TEST VI STRP: ORAL_STRIP | 2 refills | Status: DC

## 2019-11-08 MED ORDER — COMFORT EZ PEN NEEDLES 31G X 5 MM MISC: 2 refills | Status: DC

## 2019-11-08 MED ORDER — CLEVER CHOICE LANCETS 28G MISC: 2 refills | Status: DC

## 2019-11-12 ENCOUNTER — Other Ambulatory Visit: Payer: Self-pay | Admitting: Physician Assistant

## 2019-11-12 ENCOUNTER — Other Ambulatory Visit: Payer: Self-pay | Admitting: Family Medicine

## 2019-11-12 NOTE — Chronic Care Management (AMB) (Signed)
Chronic Care Management Pharmacy  Name: Christy Crawford  MRN: [id number] DOB: 07-07-1955  Chief Complaint/ HPI  Christy Crawford,  64 y.o. , female presents for their Initial CCM visit with the clinical pharmacist In office.  PCP : [name], MD  Their chronic conditions include: Type 2 DM, Dyslipidemia, Chronic pain of soulders/knee, fibromyalgia, edema.  Office Visits: 09/19/2019 - Increase Lyrica to 300 mg bid. Start Lasix once daily. Elevate legs and low salt diet if possible. Continue oxybutynin but recommend kegel exercises/bladder drills.  07/11/2019 - Increased Tresiba to 80 units daily. Sugars improved. She is wrapping wound and using compression hose. Start Trulicity once weekly beginning with 0.75 mg and increasing to 1.5 after 2 weeks. Hold glimepiride if sugars decrease less than 150 mg/dL. Increase Lyrica to 150 mg tid.  06/13/2019 -  Pain is main issue she wanted addressed. She hasn't followed up with neurologist in some time due to outstanding balance.  Started Lyrica 75 mg tid Consult Visit: None reported  Medications: Outpatient Encounter Medications as of 11/14/2019  Medication Sig  . acetaminophen (TYLENOL) 325 MG tablet Take 325 mg by mouth every 6 (six) hours as needed.  . ALPRAZolam (XANAX) 1 MG tablet Take by mouth at bedtime as needed.   Marland Kitchen amitriptyline (ELAVIL) 25 MG tablet TAKE ONE TABLET BY MOUTH AT BEDTIME  . Clever Choice Lancets 28G MISC E11.4 Use new lancet each time when checking blood sugar  . COMFORT EZ PEN NEEDLES 31G X 5 MM MISC E11.44 use new needle with each new injection  . furosemide (LASIX) 20 MG tablet Take 1 tablet (20 mg total) by mouth daily.  Marland Kitchen glimepiride (AMARYL) 4 MG tablet TAKE 1 TABLET BY MOUTH ONCE DAILY.  Marland Kitchen glucose blood (CLEVER CHOICE MICRO TEST) test strip E11.44 Use new strip each time when checking blood sugar. Check sugar 1-3 times daily.  Marland Kitchen lisinopril (ZESTRIL) 40 MG tablet TAKE 1 TABLET BY MOUTH EVERY MORNING AND1 TABLET BY  MOUTH AT BEDTIME  . Melatonin 1 MG TABS Take 5 mg by mouth at bedtime.  Marland Kitchen oxybutynin (DITROPAN) 5 MG tablet TAKE 1 TABLET BY MOUTH 3 TIMES DAILY.  Marland Kitchen pregabalin (LYRICA) 300 MG capsule Take 1 capsule (300 mg total) by mouth 2 (two) times daily.  . rosuvastatin (CRESTOR) 10 MG tablet TAKE 1 TABLET BY MOUTH ONCE DAILY. **DUEFOR FOLLOW-UP OFFICE VISIT AND LABS, NO REFILLS UNTIL SEEN**  . sertraline (ZOLOFT) 100 MG tablet Take 100 mg by mouth 2 (two) times daily.   . TRESIBA FLEXTOUCH 100 UNIT/ML FlexTouch Pen INJECT 80 UNITS SUBQ ONCE DAILY AT BEDTIME  . TRULICITY 1.5 RS/8.5IO SOPN INJECT 1.'5MG'$  INTO THE SKIN ONCE A WEEK  . Vitamin D, Cholecalciferol, 25 MCG (1000 UT) CAPS Take by mouth.  . [DISCONTINUED] lisinopril (ZESTRIL) 40 MG tablet Take 1 tablet (40 mg total) by mouth in the morning and at bedtime.  . [DISCONTINUED] TRESIBA FLEXTOUCH 100 UNIT/ML FlexTouch Pen INJECT 80 UNITS SUBQ ONCE DAILY AT BEDTIME   No facility-administered encounter medications on file as of 11/14/2019.     Current Diagnosis/Assessment:  Goals Addressed            This Visit's Progress   . Pharmacy Care Plan       CARE PLAN ENTRY (see longitudinal plan of care for additional care plan information)  Current Barriers:  . Chronic Disease Management support, education, and care coordination needs related to Hypertension, Hyperlipidemia, and Diabetes   Hypertension BP Readings from Last  3 Encounters:  09/19/19 (!) 142/76  07/11/19 (!) 174/88  06/13/19 (!) 144/68   . Pharmacist Clinical Goal(s): o Over the next 90 days, patient will work with PharmD and providers to achieve BP goal <130/80 . Current regimen:  o furosemide 20 mg daily o lisinopril 40 mg bid . Interventions: o Continue to wear compression hose to reduce leg swelling.  . Patient self care activities - Over the next 90 days, patient will: o Check BP daily, document, and provide at future appointments o Ensure daily salt intake < 2300  mg/day  Hyperlipidemia Lab Results  Component Value Date/Time   LDLCALC 79 09/19/2019 03:53 PM   . Pharmacist Clinical Goal(s): o Over the next 90 days, patient will work with PharmD and providers to achieve LDL goal < 70 . Current regimen:  o Rosuvastatin 10 mg daily . Interventions: o Consider increase dose to rosuvastatin if next LDL above goal.  . Patient self care activities - Over the next 90 days, patient will: o Continue taking medication as prescribed  Diabetes Lab Results  Component Value Date/Time   HGBA1C 7.3 (H) 09/19/2019 03:53 PM   HGBA1C 8.7 (H) 06/13/2019 10:33 AM   HGBA1C 7.5 02/12/2019 12:00 AM   . Pharmacist Clinical Goal(s): o Over the next 90 days, patient will work with PharmD and providers to achieve A1c goal <7% . Current regimen:  o glimepiride 4 mg daily o Tresiba 80 units qhs o Trulicity 1.5 mg weekly, pen needles . Interventions: o Consider increase dose of Trulicity to 3 mg and potential reduction of dose in Tresiba.  o Working to improve accessibility of Tresiba and Pen Needles by applying for patient assistance through [employer].  . Patient self care activities - Over the next 90 days, patient will: o Check blood sugar once daily, document, and provide at future appointments o Contact provider with any episodes of hypoglycemia  Medication management . Pharmacist Clinical Goal(s): o Over the next 90 days, patient will work with PharmD and providers to achieve optimal medication adherence . Current pharmacy: [facility]  . Interventions o Comprehensive medication review performed. o Utilize [facility] for medication synchronization, packaging and delivery . Patient self care activities - Over the next 90 days, patient will: o Focus on medication adherence by adherence packaging with delivery o Take medications as prescribed o Report any questions or concerns to PharmD and/or provider(s)  Initial goal documentation        Hyperlipidemia   LDL goal < 70  Lipid Panel     Component Value Date/Time   CHOL 165 09/19/2019 1553   TRIG 140 09/19/2019 1553   HDL 62 09/19/2019 1553   LDLCALC 79 09/19/2019 1553    Hepatic Function Latest Ref Rng & Units 09/19/2019 06/13/2019  Total Protein 6.0 - 8.5 g/dL 7.0 6.3  Albumin 3.8 - 4.8 g/dL 4.7 4.2  AST 0 - 40 IU/L 43(H) 32  ALT 0 - 32 IU/L 31 23  Alk Phosphatase 48 - 121 IU/L 118 102  Total Bilirubin 0.0 - 1.2 mg/dL 0.3 0.3     The 10-year ASCVD risk score Mikey Bussing DC Jr., et al., 2013) is: 14.6%   Values used to calculate the score:     Age: 67 years     Sex: Female     Is Non-Hispanic African American: No     Diabetic: Yes     Tobacco smoker: No     Systolic Blood Pressure: 408 mmHg     Is  BP treated: Yes     HDL Cholesterol: 62 mg/dL     Total Cholesterol: 165 mg/dL   Patient has failed these meds in past: none reported Patient is currently uncontrolled on the following medications:  . Rosuvastatin 10 mg daily We discussed:  diet and exercise extensively. Consider increase of rosuvastatin to 20 mg daily if next LDL above goal.   Plan  Continue current medications Hypertension   BP today is:  <130/80  Office blood pressures are  BP Readings from Last 3 Encounters:  09/19/19 (!) 142/76  07/11/19 (!) 174/88  06/13/19 (!) 144/68    Patient has failed these meds in the past: none reported Patient is currently controlled on the following medications: furosemide 20 mg daily, lisinopril 40 mg bid Patient checks BP at home daily  Patient home BP readings are ranging: 130/75 mmHg  We discussed diet and exercise extensively. Patient still notes some leg swelling but wearing compression stockings and using unna boots as needed. No longer leg weeping with current regimen.   Plan  Continue current medications      Diabetes   Recent Relevant Labs: Lab Results  Component Value Date/Time   HGBA1C 7.3 (H) 09/19/2019 03:53 PM   HGBA1C 8.7 (H)  06/13/2019 10:33 AM   HGBA1C 7.5 02/12/2019 12:00 AM     Checking BG: Daily  Recent FBG Readings:100 mg/dL  Patient has failed these meds in past: metformin Patient is currently uncontrolled on the following medications: glimepiride 4 mg daily, tresiba 80 units qhs, trulicity 1.5 mg weekly, pen needles  Last diabetic Foot exam: No results found for: HMDIABEYEEXA  Last diabetic Eye exam: No results found for: HMDIABFOOTEX   We discussed: diet and exercise extensively. Patient currently has Trulicity through patient assistance. Discussed Tyler Aas and pen needles through patient assistance. Pharmacist will coordinate paperwork with patient and provider. Pharmacist provided sample of Tresiba in office today. Patient is hopeful next blood work will be improved. Will consider increased dose of Trulicity to 3 mg weekly and potential discontinue of glimepiride.   Plan  Continue current medications and  Other Diagnosis:Depression Managed by Dr. Toy Care    Patient has failed these meds in past: none reported Patient is currently controlled on the following medications: alprazolam 1 mg qid prn, amitriptyline 24 mg qhs, sertraline 100 mg bid We discussed:  Patient reports that her daughter passed away 10 years ago. It is still difficult for patient and has some episodes of tears. She reports overall well controlled. Discussed fall risk with patient using amitriptyline, alprazolam and benadryl. Patient is willing to work to trial reducing benadryl to 25 mg daily for 1 week and then stop if tolerated.   Plan  Continue current medications  Pain   Patient has failed these meds in past: none reported Patient is currently controlled on the following medications:  . Acetaminophen/Caffeine/Aspirin OTC 2 tablets daily   . Lyrica 300 mg qpm  We discussed:  Patient has tingling/neuropathy in legs. Overall improved with treatment. Patient was recently increased to Lyrica 300 mg bid. She did not tolerate due  to daytime somnolence. Patient has reduced to 300 mg qpm and tolerating well.   Plan  Continue current medications Osteopenia / Osteoporosis   Last DEXA Scan: not available  No results found for: VD25OH   Patient has failed these meds in past: none reported Patient is currently uncontrolled on the following medications:  Marland Kitchen Vitamin D 25 mcg (1000 unit) daily  We discussed:  Recommend [id number]  units of vitamin D daily. Recommend 1200 mg of calcium daily from dietary and supplemental sources. Recommend weight-bearing and muscle strengthening exercises for building and maintaining bone density. Patient does not remember last Dexa scan date or mammogram. Last mammogram on file is 03/2018. Would like to set in [place] if possible.   Plan  Continue current medications  Health Maintenance   Patient is currently controlled on the following medications:  . Oxybutynin 5 mg tid - bladder . Benadryl 50 mg qpm - sleep/nasal congestion/skin itching  We discussed:  Patient reports that she uses the benadryl for allergies/congestion as well as itchy skin. Pharmacist counseled on the risks associated with diphenhydramine, pregabalin, amitriptyline and alprazolam. Patient is going to work on reducing/discontinuing diphenhydramine. Patient is interested in something to help her allergies/nasal congestion if possible. Patient requested montelukast if Dr. Tobie Poet approves.   Plan  Consider beginning Montelukast 10 mg daily for allergy symptoms.  Vaccines   Reviewed and discussed patient's vaccination history.  Patient is not interested in [place] shot at this time. Her family has all been vaccinated at this point. She doesn't see benefit since vaccine not developed for current strains of COVID.    There is no immunization history on file for this patient.  Plan  Recommended patient discuss COVID vaccine at next office visit.  Medication Management   Pt uses [facility] for all  medications Uses pill box? Yes Pt endorses good compliance  We discussed: Patient interested in pharmacy delivery and adherence packaging.   Verbal consent obtained for [facility] enhanced pharmacy services (medication synchronization, adherence packaging, delivery coordination). A medication sync plan was created to allow patient to get all medications delivered once every 30 to 90 days per patient preference. Patient understands they have freedom to choose pharmacy and clinical pharmacist will coordinate care between all prescribers and [facility].    Plan  Utilize [facility] for medication synchronization, packaging and delivery    Follow up: 1 month phone visit

## 2019-11-14 ENCOUNTER — Ambulatory Visit: Payer: Medicare Other

## 2019-11-14 ENCOUNTER — Other Ambulatory Visit: Payer: Self-pay

## 2019-11-14 DIAGNOSIS — E782 Mixed hyperlipidemia: Secondary | ICD-10-CM

## 2019-11-14 DIAGNOSIS — E1144 Type 2 diabetes mellitus with diabetic amyotrophy: Secondary | ICD-10-CM

## 2019-11-14 NOTE — Patient Instructions (Addendum)
Visit Information  Thank you for your time discussing your medications. I look forward to working with you to achieve your health care goals. Below is a summary of what we talked about during our visit.   Goals Addressed            This Visit's Progress   . Pharmacy Care Plan       CARE PLAN ENTRY (see longitudinal plan of care for additional care plan information)  Current Barriers:  . Chronic Disease Management support, education, and care coordination needs related to Hypertension, Hyperlipidemia, and Diabetes   Hypertension BP Readings from Last 3 Encounters:  09/19/19 (!) 142/76  07/11/19 (!) 174/88  06/13/19 (!) 144/68   . Pharmacist Clinical Goal(s): o Over the next 90 days, patient will work with PharmD and providers to achieve BP goal <130/80 . Current regimen:  o furosemide 20 mg daily o lisinopril 40 mg bid . Interventions: o Continue to wear compression hose to reduce leg swelling.  . Patient self care activities - Over the next 90 days, patient will: o Check BP daily, document, and provide at future appointments o Ensure daily salt intake < 2300 mg/day  Hyperlipidemia Lab Results  Component Value Date/Time   LDLCALC 79 09/19/2019 03:53 PM   . Pharmacist Clinical Goal(s): o Over the next 90 days, patient will work with PharmD and providers to achieve LDL goal < 70 . Current regimen:  o Rosuvastatin 10 mg daily . Interventions: o Consider increase dose to rosuvastatin if next LDL above goal.  . Patient self care activities - Over the next 90 days, patient will: o Continue taking medication as prescribed  Diabetes Lab Results  Component Value Date/Time   HGBA1C 7.3 (H) 09/19/2019 03:53 PM   HGBA1C 8.7 (H) 06/13/2019 10:33 AM   HGBA1C 7.5 02/12/2019 12:00 AM   . Pharmacist Clinical Goal(s): o Over the next 90 days, patient will work with PharmD and providers to achieve A1c goal <7% . Current regimen:  o glimepiride 4 mg daily o Tresiba 80 units  qhs o Trulicity 1.5 mg weekly, pen needles . Interventions: o Consider increase dose of Trulicity to 3 mg and potential reduction of dose in Tresiba.  o Working to improve accessibility of Tresiba and Pen Needles by applying for patient assistance through [employer].  . Patient self care activities - Over the next 90 days, patient will: o Check blood sugar once daily, document, and provide at future appointments o Contact provider with any episodes of hypoglycemia  Medication management . Pharmacist Clinical Goal(s): o Over the next 90 days, patient will work with PharmD and providers to achieve optimal medication adherence . Current pharmacy: [facility]  . Interventions o Comprehensive medication review performed. o Utilize [facility] for medication synchronization, packaging and delivery . Patient self care activities - Over the next 90 days, patient will: o Focus on medication adherence by adherence packaging with delivery o Take medications as prescribed o Report any questions or concerns to PharmD and/or provider(s)  Initial goal documentation        Christy Crawford was given information about Chronic Care Management services today including:  1. CCM service includes personalized support from designated clinical staff supervised by her physician, including individualized plan of care and coordination with other care providers 2. 24/7 contact phone numbers for assistance for urgent and routine care needs. 3. Standard insurance, coinsurance, copays and deductibles apply for chronic care management only during months in which we provide at least  20 minutes of these services. Most insurances cover these services at 100%, however patients may be responsible for any copay, coinsurance and/or deductible if applicable. This service may help you avoid the need for more expensive face-to-face services. 4. Only one practitioner may furnish and bill the service in a calendar month. 5. The  patient may stop CCM services at any time (effective at the end of the month) by phone call to the office staff.  Patient agreed to services and verbal consent obtained.   The patient verbalized understanding of instructions provided today and agreed to receive a mailed copy of patient instruction and/or educational materials. Telephone follow up appointment with pharmacy team member scheduled for: 09/21  Sherre Poot, PharmD Clinical Pharmacist [facility]  [phone] (office) [phone] (mobile)  DASH Eating Plan DASH stands for "Dietary Approaches to Stop Hypertension." The DASH eating plan is a healthy eating plan that has been shown to reduce high blood pressure (hypertension). It may also reduce your risk for type 2 diabetes, heart disease, and stroke. The DASH eating plan may also help with weight loss. What are tips for following this plan?  General guidelines  Avoid eating more than 2,300 mg (milligrams) of salt (sodium) a day. If you have hypertension, you may need to reduce your sodium intake to 1,500 mg a day.  Limit alcohol intake to no more than 1 drink a day for nonpregnant women and 2 drinks a day for men. One drink equals 12 oz of beer, 5 oz of wine, or 1 oz of hard liquor.  Work with your health care provider to maintain a healthy body weight or to lose weight. Ask what an ideal weight is for you.  Get at least 30 minutes of exercise that causes your heart to beat faster (aerobic exercise) most days of the week. Activities may include walking, swimming, or biking.  Work with your health care provider or diet and nutrition specialist (dietitian) to adjust your eating plan to your individual calorie needs. Reading food labels   Check food labels for the amount of sodium per serving. Choose foods with less than 5 percent of the Daily Value of sodium. Generally, foods with less than 300 mg of sodium per serving fit into this eating plan.  To find whole  grains, look for the word "whole" as the first word in the ingredient list. Shopping  Buy products labeled as "low-sodium" or "no salt added."  Buy fresh foods. Avoid canned foods and premade or frozen meals. Cooking  Avoid adding salt when cooking. Use salt-free seasonings or herbs instead of table salt or sea salt. Check with your health care provider or pharmacist before using salt substitutes.  Do not fry foods. Cook foods using healthy methods such as baking, boiling, grilling, and broiling instead.  Cook with heart-healthy oils, such as olive, canola, soybean, or sunflower oil. Meal planning  Eat a balanced diet that includes: ? 5 or more servings of fruits and vegetables each day. At each meal, try to fill half of your plate with fruits and vegetables. ? Up to 6-8 servings of whole grains each day. ? Less than 6 oz of lean meat, poultry, or fish each day. A 3-oz serving of meat is about the same size as a deck of cards. One egg equals 1 oz. ? 2 servings of low-fat dairy each day. ? A serving of nuts, seeds, or beans 5 times each week. ? Heart-healthy fats. Healthy fats called Omega-3 fatty acids  are found in foods such as flaxseeds and coldwater fish, like sardines, salmon, and mackerel.  Limit how much you eat of the following: ? Canned or prepackaged foods. ? Food that is high in trans fat, such as fried foods. ? Food that is high in saturated fat, such as fatty meat. ? Sweets, desserts, sugary drinks, and other foods with added sugar. ? Full-fat dairy products.  Do not salt foods before eating.  Try to eat at least 2 vegetarian meals each week.  Eat more home-cooked food and less restaurant, buffet, and fast food.  When eating at a restaurant, ask that your food be prepared with less salt or no salt, if possible. What foods are recommended? The items listed may not be a complete list. Talk with your dietitian about what dietary choices are best for  you. Grains Whole-grain or whole-wheat bread. Whole-grain or whole-wheat pasta. [place]e Coccia rice. [place]ena Morrow. Bulgur. Whole-grain and low-sodium cereals. Pita bread. Low-fat, low-sodium crackers. Whole-wheat flour tortillas. Vegetables Fresh or frozen vegetables (raw, steamed, roasted, or grilled). Low-sodium or reduced-sodium tomato and vegetable juice. Low-sodium or reduced-sodium tomato sauce and tomato paste. Low-sodium or reduced-sodium canned vegetables. Fruits All fresh, dried, or frozen fruit. Canned fruit in natural juice (without added sugar). Meat and other protein foods Skinless chicken or [place]. Ground chicken or [place]. Pork with fat trimmed off. Fish and seafood. Egg whites. Dried beans, peas, or lentils. Unsalted nuts, nut butters, and seeds. Unsalted canned beans. Lean cuts of beef with fat trimmed off. Low-sodium, lean deli meat. Dairy Low-fat (1%) or fat-free (skim) milk. Fat-free, low-fat, or reduced-fat cheeses. Nonfat, low-sodium ricotta or cottage cheese. Low-fat or nonfat yogurt. Low-fat, low-sodium cheese. Fats and oils Soft margarine without trans fats. Vegetable oil. Low-fat, reduced-fat, or light mayonnaise and salad dressings (reduced-sodium). Canola, safflower, olive, soybean, and sunflower oils. Avocado. Seasoning and other foods Herbs. Spices. Seasoning mixes without salt. Unsalted popcorn and pretzels. Fat-free sweets. What foods are not recommended? The items listed may not be a complete list. Talk with your dietitian about what dietary choices are best for you. Grains Baked goods made with fat, such as croissants, muffins, or some breads. Dry pasta or rice meal packs. Vegetables Creamed or fried vegetables. Vegetables in a cheese sauce. Regular canned vegetables (not low-sodium or reduced-sodium). Regular canned tomato sauce and paste (not low-sodium or reduced-sodium). Regular tomato and vegetable juice (not low-sodium or reduced-sodium). Angie Fava.  Olives. Fruits Canned fruit in a light or heavy syrup. Fried fruit. Fruit in cream or butter sauce. Meat and other protein foods Fatty cuts of meat. Ribs. Fried meat. Berniece Salines. Sausage. Bologna and other processed lunch meats. Salami. Fatback. Hotdogs. Bratwurst. Salted nuts and seeds. Canned beans with added salt. Canned or smoked fish. Whole eggs or egg yolks. Chicken or [place] with skin. Dairy Whole or 2% milk, cream, and half-and-half. Whole or full-fat cream cheese. Whole-fat or sweetened yogurt. Full-fat cheese. Nondairy creamers. Whipped toppings. Processed cheese and cheese spreads. Fats and oils Butter. Stick margarine. Lard. Shortening. Ghee. Bacon fat. Tropical oils, such as coconut, palm kernel, or palm oil. Seasoning and other foods Salted popcorn and pretzels. Onion salt, garlic salt, seasoned salt, table salt, and sea salt. Worcestershire sauce. Tartar sauce. Barbecue sauce. Teriyaki sauce. Soy sauce, including reduced-sodium. Steak sauce. Canned and packaged gravies. Fish sauce. Oyster sauce. Cocktail sauce. Horseradish that you find on the shelf. Ketchup. Mustard. Meat flavorings and tenderizers. Bouillon cubes. Hot sauce and Tabasco sauce. Premade or packaged marinades. Premade or packaged taco  seasonings. Relishes. Regular salad dressings. Where to find more information:  National Heart, Lung, and [place]: https://wilson-eaton.com/  American Heart Association: www.heart.org Summary  The DASH eating plan is a healthy eating plan that has been shown to reduce high blood pressure (hypertension). It may also reduce your risk for type 2 diabetes, heart disease, and stroke.  With the DASH eating plan, you should limit salt (sodium) intake to 2,300 mg a day. If you have hypertension, you may need to reduce your sodium intake to 1,500 mg a day.  When on the DASH eating plan, aim to eat more fresh fruits and vegetables, whole grains, lean proteins, low-fat dairy, and heart-healthy  fats.  Work with your health care provider or diet and nutrition specialist (dietitian) to adjust your eating plan to your individual calorie needs. This information is not intended to replace advice given to you by your health care provider. Make sure you discuss any questions you have with your health care provider. Document Revised: 03/04/2017 Document Reviewed: 03/15/2016 Elsevier Patient Education  2020 [employer].

## 2019-11-19 ENCOUNTER — Other Ambulatory Visit: Payer: Self-pay | Admitting: Family Medicine

## 2019-11-19 DIAGNOSIS — Z1231 Encounter for screening mammogram for malignant neoplasm of breast: Secondary | ICD-10-CM

## 2019-11-19 DIAGNOSIS — N958 Other specified menopausal and perimenopausal disorders: Secondary | ICD-10-CM

## 2019-11-19 MED ORDER — TRULICITY 3 MG/0.5ML ~~LOC~~ SOAJ
3.0000 mg | SUBCUTANEOUS | 2 refills | Status: DC
Start: 2019-11-19 — End: 2019-11-20

## 2019-11-19 MED ORDER — MONTELUKAST SODIUM 10 MG PO TABS: 10.0000 mg | ORAL_TABLET | Freq: Every day | ORAL | 3 refills | Status: DC

## 2019-11-20 ENCOUNTER — Telehealth: Payer: Self-pay

## 2019-11-20 ENCOUNTER — Other Ambulatory Visit: Payer: Self-pay | Admitting: Family Medicine

## 2019-11-20 ENCOUNTER — Other Ambulatory Visit: Payer: Self-pay

## 2019-11-20 DIAGNOSIS — E1144 Type 2 diabetes mellitus with diabetic amyotrophy: Secondary | ICD-10-CM

## 2019-11-20 MED ORDER — OXYBUTYNIN CHLORIDE 5 MG PO TABS: 5.0000 mg | ORAL_TABLET | Freq: Three times a day (TID) | ORAL | 1 refills | Status: DC

## 2019-11-20 MED ORDER — PREGABALIN 300 MG PO CAPS: 300.0000 mg | ORAL_CAPSULE | Freq: Every evening | ORAL | 1 refills | Status: DC

## 2019-11-20 MED ORDER — SERTRALINE HCL 100 MG PO TABS: 100.0000 mg | ORAL_TABLET | Freq: Two times a day (BID) | ORAL | 1 refills | Status: AC

## 2019-11-20 MED ORDER — TRULICITY 3 MG/0.5ML ~~LOC~~ SOAJ: 3.0000 mg | SUBCUTANEOUS | 2 refills | Status: DC

## 2019-11-20 MED ORDER — FUROSEMIDE 20 MG PO TABS: 20.0000 mg | ORAL_TABLET | Freq: Every day | ORAL | 0 refills | Status: DC

## 2019-11-20 MED ORDER — ROSUVASTATIN CALCIUM 10 MG PO TABS: ORAL_TABLET | ORAL | 1 refills | Status: DC

## 2019-11-20 MED ORDER — GLIMEPIRIDE 4 MG PO TABS: 4.0000 mg | ORAL_TABLET | Freq: Every day | ORAL | 1 refills | Status: DC

## 2019-11-20 MED ORDER — LISINOPRIL 40 MG PO TABS: ORAL_TABLET | ORAL | 1 refills | Status: DC

## 2019-11-20 MED ORDER — AMITRIPTYLINE HCL 25 MG PO TABS: 25.0000 mg | ORAL_TABLET | Freq: Every day | ORAL | 1 refills | Status: DC

## 2019-11-20 NOTE — Telephone Encounter (Signed)
-----   Message from [name], MD sent at 11/19/2019  8:40 PM EDT ----- Regarding: orders and meds. Agree. Ordered mammogram and dexa. Increase trulicity 3 mg once weekly, and sent singulair. kc ----- Message ----- From: Burnice Logan, [facility] Sent: 11/14/2019  10:26 PM EDT To: [name], MD  Dr. Tobie Poet -   Patient's daughter would like her mom to try Singulair 10 mg daily for allergies/nasal congestion. I have asked patient to reduce OTC diphenhydramine dose and potentially discontinue. Patient takes alprazolam, amitriptyline and pregabalin at bedtime  Patient's blood sugar is around 100 mg/dL. I have asked her to discontinue glimepiride per your note stating to stop once blood sugar <150 mg/dL. She may benefit from increasing Trulicity to 3 mg to reduce insulin daily dose and reduce risk of low blood sugars. Would like to evaluate after blood work in September.   Patient is overdue for mammogram and does not have a dexa scan on file. She would like to go to [facility] for this testing.   Thanks!

## 2019-11-21 NOTE — Progress Notes (Signed)
Updated dose of Trulicity 3 mg weekly prescription faxed to [place], PharmD, [facility] Clinical Pharmacist [place] [phone] (office) [phone] (mobile)

## 2019-11-26 ENCOUNTER — Other Ambulatory Visit: Payer: Self-pay | Admitting: Family Medicine

## 2019-11-26 DIAGNOSIS — E2839 Other primary ovarian failure: Secondary | ICD-10-CM

## 2019-11-27 ENCOUNTER — Other Ambulatory Visit: Payer: Self-pay

## 2019-11-27 DIAGNOSIS — Z1231 Encounter for screening mammogram for malignant neoplasm of breast: Secondary | ICD-10-CM

## 2019-11-27 DIAGNOSIS — M810 Age-related osteoporosis without current pathological fracture: Secondary | ICD-10-CM

## 2019-11-27 NOTE — Progress Notes (Signed)
Mammogram and DEXA scan ordered 

## 2019-12-03 ENCOUNTER — Other Ambulatory Visit: Payer: Self-pay | Admitting: Family Medicine

## 2019-12-03 MED ORDER — PREGABALIN 300 MG PO CAPS
300.0000 mg | ORAL_CAPSULE | Freq: Every evening | ORAL | 1 refills | Status: DC
Start: 2019-12-03 — End: 2020-06-02

## 2019-12-25 ENCOUNTER — Ambulatory Visit: Payer: Medicare Other | Admitting: Family Medicine

## 2019-12-27 ENCOUNTER — Telehealth: Payer: Medicare Other

## 2020-01-01 ENCOUNTER — Encounter: Payer: Self-pay | Admitting: Family Medicine

## 2020-01-01 ENCOUNTER — Ambulatory Visit (INDEPENDENT_AMBULATORY_CARE_PROVIDER_SITE_OTHER): Payer: Medicare Other | Admitting: Family Medicine

## 2020-01-01 VITALS — BP 142/78 | HR 91 | Temp 97.4°F | Ht 72.0 in | Wt 318.0 lb

## 2020-01-01 DIAGNOSIS — E1169 Type 2 diabetes mellitus with other specified complication: Secondary | ICD-10-CM | POA: Diagnosis not present

## 2020-01-01 DIAGNOSIS — E1144 Type 2 diabetes mellitus with diabetic amyotrophy: Secondary | ICD-10-CM

## 2020-01-01 DIAGNOSIS — Z23 Encounter for immunization: Secondary | ICD-10-CM | POA: Diagnosis not present

## 2020-01-01 DIAGNOSIS — E782 Mixed hyperlipidemia: Secondary | ICD-10-CM | POA: Diagnosis not present

## 2020-01-01 DIAGNOSIS — E785 Hyperlipidemia, unspecified: Secondary | ICD-10-CM | POA: Diagnosis not present

## 2020-01-01 DIAGNOSIS — M797 Fibromyalgia: Secondary | ICD-10-CM

## 2020-01-01 DIAGNOSIS — Z6841 Body Mass Index (BMI) 40.0 and over, adult: Secondary | ICD-10-CM

## 2020-01-01 DIAGNOSIS — I10 Essential (primary) hypertension: Secondary | ICD-10-CM

## 2020-01-01 NOTE — Progress Notes (Signed)
Established Patient Office Visit  Subjective:  Patient ID: Christy Crawford, female    DOB: 03-Jun-1955  Age: 64 y.o. MRN: [id number]  CC:  Chief Complaint  Patient presents with  . Diabetes  . Hyperlipidemia  . Hypertension    HPI [name] presents for follow up of pain. Lyrica has helped some, but not enough. She rates her pain as 6/10. This is better than 10/10 last time. She has fibromyalgia, diabetic amyotrophic myelopathy, and arthritis. Previously saw Dr. Krista Blue.   Diabetes com;idated by amyotrophy. She has not seen neurology for 2 years. Currently on tresiba to 80 U daily, glimiperide 4 mg once in am and trulicity 3 mg once weekly. Sugars ranging from 80 (fasting) to 110 (Sugars improved. Eating healthy.) Not exercising due to her pain. She has difficulty checking her feet, but family helps her. Eating healthier.  Hyperlipidemia - Taking crestor 10 mg once daily at night.  Eating healthier.  Past Medical History:  Diagnosis Date  . Chronic ulcer of right calf ([place])   . Diabetes mellitus without complication ([name])   . 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 Essential hypertension   . Fibromyalgia   . Generalized edema   . Hyperlipidemia   . Hypertension   . Major depression   . Migraine headache   . Mixed hyperlipidemia   . Sleep apnea   . Telogen effluvium     Past Surgical History:  Procedure Laterality Date  . CHOLECYSTECTOMY    . lens implants Bilateral     Family History  Adopted: Yes  Problem Relation Age of Onset  . Diabetes Maternal Aunt     Social History   Socioeconomic History  . Marital status: Married    Spouse name: Not on file  . Number of children: 1  . Years of education: Not on file  . Highest education level: Not on file  Occupational History  . Occupation: disabled  Tobacco Use  . Smoking status: Former Smoker    Types: Cigarettes    Quit date: 2000     Years since quitting: 21.7  . Smokeless tobacco: Never Used  Substance and Sexual Activity  . Alcohol use: Yes    Alcohol/week: 1.0 standard drink    Types: 1 Glasses of wine per week    Comment: socially  . Drug use: Never  . Sexual activity: Not on file  Other Topics Concern  . Not on file  Social History Narrative  . Not on file   Social Determinants of Health   Financial Resource Strain:   . Difficulty of Paying Living Expenses: Not on file  Food Insecurity: No Food Insecurity  . Worried About Charity fundraiser in the Last Year: Never true  . Ran Out of Food in the Last Year: Never true  Transportation Needs: No Transportation Needs  . Lack of Transportation (Medical): No  . Lack of Transportation (Non-Medical): No  Physical Activity:   . Days of Exercise per Week: Not on file  . Minutes of Exercise per Session: Not on file  Stress:   . Feeling of Stress : Not on file  Social Connections:   . Frequency of Communication with Friends and Family: Not on file  . Frequency of Social Gatherings with Friends and Family: Not on file  . Attends Religious Services: Not on file  . Active Member of Clubs or Organizations: Not on file  .  Attends Archivist Meetings: Not on file  . Marital Status: Not on file  Intimate Partner Violence:   . Fear of Current or Ex-Partner: Not on file  . Emotionally Abused: Not on file  . Physically Abused: Not on file  . Sexually Abused: Not on file    Outpatient Medications Prior to Visit  Medication Sig Dispense Refill  . acetaminophen (TYLENOL) 325 MG tablet Take 325 mg by mouth every 6 (six) hours as needed. (Patient not taking: Reported on 11/14/2019)    . ALPRAZolam (XANAX) 1 MG tablet Take 1 mg by mouth in the morning, at noon, in the evening, and at bedtime.     Marland Kitchen amitriptyline (ELAVIL) 25 MG tablet Take 1 tablet (25 mg total) by mouth at bedtime. 90 tablet 1  . aspirin-acetaminophen-caffeine (EXCEDRIN MIGRAINE) 250-250-65 MG  tablet Take 2 tablets by mouth in the morning.    [name] Choice Lancets 28G MISC E11.4 Use new lancet each time when checking blood sugar 100 each 2  . COMFORT EZ PEN NEEDLES 31G X 5 MM MISC E11.44 use new needle with each new injection 100 each 2  . Dulaglutide (TRULICITY) 3 AL/9.3XT SOPN Inject 0.5 mLs (3 mg total) as directed once a week. 3 mL 2  . furosemide (LASIX) 20 MG tablet Take 1 tablet (20 mg total) by mouth daily. 90 tablet 0  . glimepiride (AMARYL) 4 MG tablet Take 1 tablet (4 mg total) by mouth daily. 90 tablet 1  . glucose blood (CLEVER CHOICE MICRO TEST) test strip E11.44 Use new strip each time when checking blood sugar. Check sugar 1-3 times daily. 100 each 2  . lisinopril (ZESTRIL) 40 MG tablet TAKE 1 TABLET BY MOUTH EVERY MORNING AND1 TABLET BY MOUTH AT BEDTIME 180 tablet 1  . montelukast (SINGULAIR) 10 MG tablet TAKE ONE TABLET BY MOUTH EVERYDAY AT BEDTIME 90 tablet 3  . Multiple Vitamins-Minerals (CENTRUM SILVER PO) Take 1 tablet by mouth daily.    Marland Kitchen oxybutynin (DITROPAN) 5 MG tablet Take 1 tablet (5 mg total) by mouth 3 (three) times daily. 270 tablet 1  . pregabalin (LYRICA) 300 MG capsule Take 1 capsule (300 mg total) by mouth every evening. 180 capsule 1  . rosuvastatin (CRESTOR) 10 MG tablet TAKE 1 TABLET BY MOUTH ONCE DAILY. **DUEFOR FOLLOW-UP OFFICE VISIT AND LABS, NO REFILLS UNTIL SEEN** 90 tablet 1  . sertraline (ZOLOFT) 100 MG tablet Take 1 tablet (100 mg total) by mouth 2 (two) times daily. 180 tablet 1  . TRESIBA FLEXTOUCH 100 UNIT/ML FlexTouch Pen INJECT 80 UNITS SUBQ ONCE DAILY AT BEDTIME 30 mL 1  . Vitamin D, Cholecalciferol, 25 MCG (1000 UT) CAPS Take by mouth.    . diphenhydrAMINE (BENADRYL) 50 MG tablet Take 50 mg by mouth at bedtime.    . Melatonin 1 MG TABS Take 5 mg by mouth at bedtime. (Patient not taking: Reported on 11/14/2019)     No facility-administered medications prior to visit.      ROS Review of Systems  Constitutional: Negative for  chills, fatigue and fever.  HENT: Negative for congestion, ear pain, rhinorrhea and sore throat.   Respiratory: Negative for cough and shortness of breath.   Cardiovascular: Negative for chest pain.  Gastrointestinal: Negative for abdominal pain (sometimes).  Endocrine: Negative for polydipsia and polyphagia.  Genitourinary: Negative for dysuria and urgency.       Patient has stress incontinence and urge incontinence. She works on getting to the bathroom sooner. Oxybutynin helps.  Musculoskeletal: Positive for back pain and myalgias. Negative for arthralgias.  Neurological: Positive for dizziness and weakness. Negative for light-headedness and headaches.     Objective:    Physical Exam Vitals reviewed.  Constitutional:      Appearance: She is well-developed. She is obese.  Neck:     Vascular: No carotid bruit.  Cardiovascular:     Rate and Rhythm: Normal rate and regular rhythm.     Heart sounds: Normal heart sounds.  Pulmonary:     Effort: Pulmonary effort is normal.     Breath sounds: Normal breath sounds.  Abdominal:     General: Bowel sounds are normal.     Palpations: Abdomen is soft.     Tenderness: There is no abdominal tenderness.  Musculoskeletal:        General: Swelling (pedal BL. Weeping. ) and tenderness present.     Comments: Compression stockings are on.  All FM trigger points are positive.   Neurological:     Mental Status: She is alert.     Sensory: Sensory deficit present.     Motor: Weakness (BL lower extremities) present.  Psychiatric:        Behavior: Behavior normal.     Comments: Sad.    BP (!) 142/78   Pulse 91   Temp (!) 97.4 F (36.3 C)   Ht 6' (1.829 m)   SpO2 98%   BMI 43.13 kg/m  Wt Readings from Last 3 Encounters:  07/11/19 (!) 318 lb (144.2 kg)  06/13/19 (!) 316 lb (143.3 kg)     Health Maintenance Due  Topic Date Due  . Hepatitis C Screening  Never done  . FOOT EXAM  Never done  . COVID-19 Vaccine (1) Never done  . HIV  Screening  Never done  . TETANUS/TDAP  Never done  . PAP SMEAR-Modifier  Never done  . COLONOSCOPY  Never done  . OPHTHALMOLOGY EXAM  12/20/2018  . DEXA SCAN  Never done    There are no preventive care reminders to display for this patient.  Lab Results  Component Value Date   TSH 4.060 06/13/2019   Lab Results  Component Value Date   WBC 8.0 01/01/2020   HGB 14.6 01/01/2020   HCT 43.0 01/01/2020   MCV 86 01/01/2020   PLT 215 01/01/2020   Lab Results  Component Value Date   NA 141 01/01/2020   K 5.5 (H) 01/01/2020   CO2 30 (H) 01/01/2020   GLUCOSE 130 (H) 01/01/2020   BUN 13 01/01/2020   CREATININE 0.83 01/01/2020   BILITOT 0.4 01/01/2020   ALKPHOS 124 (H) 01/01/2020   AST 34 01/01/2020   ALT 26 01/01/2020   PROT 6.8 01/01/2020   ALBUMIN 4.5 01/01/2020   CALCIUM 10.2 01/01/2020   Lab Results  Component Value Date   CHOL 145 01/01/2020   Lab Results  Component Value Date   HDL 62 01/01/2020   Lab Results  Component Value Date   LDLCALC 64 01/01/2020   Lab Results  Component Value Date   TRIG 103 01/01/2020   Lab Results  Component Value Date   CHOLHDL 2.3 01/01/2020   Lab Results  Component Value Date   HGBA1C 7.0 (H) 01/01/2020      Assessment & Plan:  1. 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 Recommend check sugars before meals and before bedtime. Recommend check feet daily. Medicines: Increase TRULICITY 4.5 MG ONCE WEEKLY. CONTINUE TRESIBA AT 80  U AT BEDTIME. IF SUGARS DECREASE LESS THAN 150, I WOULD RECOMMEND HOLD GLIMEPERIDE.  Comprehensive metabolic panel - CBC with Differential/Platelet  2. Fibromyalgia The current medical regimen is effective;  continue present plan and medications. - pregabalin (LYRICA) 150 MG capsule; Take 1 capsule (150 mg total) by mouth 3 (three) times daily.  Dispense: 90 capsule; Refill: 0  3. Class 3 severe obesity due to excess calories with serious  comorbidity and body mass index (BMI) of 40.0 to 44.9 in adult ([place])   4. Mixed hyperlipidemia The current medical regimen is effective;  continue present plan and medications. Recommend low fat diet Labs ordered: - Lipid panel - Comprehensive metabolic panel  6. Dyslipidemia associated with type 2 diabetes mellitus ([facility]) Control: not at goal Recommend check sugars fasting daily. Recommend check feet daily. Recommend annual eye exams. Medicines: see above. Continue to work on eating a healthy diet and exercise.  Labs drawn today.   - CBC with Differential/Platelet - Hemoglobin A1c - Lipid panel  7. Essential hypertension Well controlled.  No changes to medicines.  Continue to work on eating a healthy diet and exercise.  Labs drawn today.   8. Need for pneumococcal vaccine - Pneumococcal polysaccharide vaccine 23-valent greater than or equal to 2yo subcutaneous/IM  9. Need for immunization against influenza - Flu Vaccine QUAD High Dose(Fluad)  Follow-up: Return in about 3 months (around 04/01/2020) for Fasting.    [name], MD

## 2020-01-01 NOTE — Patient Instructions (Signed)
Await labs. I anticipate increasing trulicity to 4.5 mg weekly.  Continue tresiba 80 U daily.  I anticipate stopping glimeperide.

## 2020-01-02 ENCOUNTER — Telehealth: Payer: Medicare Other

## 2020-01-02 LAB — COMPREHENSIVE METABOLIC PANEL
ALT: 26 IU/L (ref 0–32)
AST: 34 IU/L (ref 0–40)
Albumin/Globulin Ratio: 2 (ref 1.2–2.2)
Albumin: 4.5 g/dL (ref 3.8–4.8)
Alkaline Phosphatase: 124 IU/L — ABNORMAL HIGH (ref 44–121)
BUN/Creatinine Ratio: 16 (ref 12–28)
BUN: 13 mg/dL (ref 8–27)
Bilirubin Total: 0.4 mg/dL (ref 0.0–1.2)
CO2: 30 mmol/L — ABNORMAL HIGH (ref 20–29)
Calcium: 10.2 mg/dL (ref 8.7–10.3)
Chloride: 98 mmol/L (ref 96–106)
Creatinine, Ser: 0.83 mg/dL (ref 0.57–1.00)
GFR calc Af Amer: 86 mL/min/{1.73_m2} (ref >59)
GFR calc non Af Amer: 74 mL/min/{1.73_m2} (ref >59)
Globulin, Total: 2.3 g/dL (ref 1.5–4.5)
Glucose: 130 mg/dL — ABNORMAL HIGH (ref 65–99)
Potassium: 5.5 mmol/L — ABNORMAL HIGH (ref 3.5–5.2)
Sodium: 141 mmol/L (ref 134–144)
Total Protein: 6.8 g/dL (ref 6.0–8.5)

## 2020-01-02 LAB — CBC WITH DIFFERENTIAL/PLATELET
Basophils Absolute: 0.1 10*3/uL (ref 0.0–0.2)
Basos: 1 %
EOS (ABSOLUTE): 0.2 10*3/uL (ref 0.0–0.4)
Eos: 3 %
Hematocrit: 43 % (ref 34.0–46.6)
Hemoglobin: 14.6 g/dL (ref 11.1–15.9)
Immature Grans (Abs): 0.1 10*3/uL (ref 0.0–0.1)
Immature Granulocytes: 1 %
Lymphocytes Absolute: 1.4 10*3/uL (ref 0.7–3.1)
Lymphs: 17 %
MCH: 29.1 pg (ref 26.6–33.0)
MCHC: 34 g/dL (ref 31.5–35.7)
MCV: 86 fL (ref 79–97)
Monocytes Absolute: 0.5 10*3/uL (ref 0.1–0.9)
Monocytes: 7 %
Neutrophils Absolute: 5.7 10*3/uL (ref 1.4–7.0)
Neutrophils: 71 %
Platelets: 215 10*3/uL (ref 150–450)
RBC: 5.02 x10E6/uL (ref 3.77–5.28)
RDW: 13.8 % (ref 11.7–15.4)
WBC: 8 10*3/uL (ref 3.4–10.8)

## 2020-01-02 LAB — LIPID PANEL
Chol/HDL Ratio: 2.3 ratio (ref 0.0–4.4)
Cholesterol, Total: 145 mg/dL (ref 100–199)
HDL: 62 mg/dL (ref >39)
LDL Chol Calc (NIH): 64 mg/dL (ref 0–99)
Triglycerides: 103 mg/dL (ref 0–149)
VLDL Cholesterol Cal: 19 mg/dL (ref 5–40)

## 2020-01-02 LAB — HEMOGLOBIN A1C
Est. average glucose Bld gHb Est-mCnc: 154 mg/dL
Hgb A1c MFr Bld: 7 % — ABNORMAL HIGH (ref 4.8–5.6)

## 2020-01-02 LAB — CARDIOVASCULAR RISK ASSESSMENT

## 2020-01-02 NOTE — Chronic Care Management (AMB) (Deleted)
Chronic Care Management Pharmacy  Name: Christy Crawford  MRN: [id number] DOB: 1956/03/19  Chief Complaint/ HPI  Christy Crawford,  64 y.o. , female presents for their Initial CCM visit with the clinical pharmacist In office.  PCP : [name], MD  Their chronic conditions include: Type 2 DM, Dyslipidemia, Chronic pain of soulders/knee, fibromyalgia, edema.  Office Visits: 09/19/2019 - Increase Lyrica to 300 mg bid. Start Lasix once daily. Elevate legs and low salt diet if possible. Continue oxybutynin but recommend kegel exercises/bladder drills.  07/11/2019 - Increased Tresiba to 80 units daily. Sugars improved. She is wrapping wound and using compression hose. Start Trulicity once weekly beginning with 0.75 mg and increasing to 1.5 after 2 weeks. Hold glimepiride if sugars decrease less than 150 mg/dL. Increase Lyrica to 150 mg tid.  06/13/2019 -  Pain is main issue she wanted addressed. She hasn't followed up with neurologist in some time due to outstanding balance.  Started Lyrica 75 mg tid Consult Visit: None reported  Medications: Outpatient Encounter Medications as of 01/02/2020  Medication Sig  . acetaminophen (TYLENOL) 325 MG tablet Take 325 mg by mouth every 6 (six) hours as needed. (Patient not taking: Reported on 11/14/2019)  . ALPRAZolam (XANAX) 1 MG tablet Take 1 mg by mouth in the morning, at noon, in the evening, and at bedtime.   Marland Kitchen amitriptyline (ELAVIL) 25 MG tablet Take 1 tablet (25 mg total) by mouth at bedtime.  Marland Kitchen aspirin-acetaminophen-caffeine (EXCEDRIN MIGRAINE) 250-250-65 MG tablet Take 2 tablets by mouth in the morning.  [name] Choice Lancets 28G MISC E11.4 Use new lancet each time when checking blood sugar  . COMFORT EZ PEN NEEDLES 31G X 5 MM MISC E11.44 use new needle with each new injection  . Dulaglutide (TRULICITY) 3 BO/1.7PZ SOPN Inject 0.5 mLs (3 mg total) as directed once a week.  . furosemide (LASIX) 20 MG tablet Take 1 tablet (20 mg total) by mouth  daily.  Marland Kitchen glimepiride (AMARYL) 4 MG tablet Take 1 tablet (4 mg total) by mouth daily.  Marland Kitchen glucose blood (CLEVER CHOICE MICRO TEST) test strip E11.44 Use new strip each time when checking blood sugar. Check sugar 1-3 times daily.  Marland Kitchen lisinopril (ZESTRIL) 40 MG tablet TAKE 1 TABLET BY MOUTH EVERY MORNING AND1 TABLET BY MOUTH AT BEDTIME  . montelukast (SINGULAIR) 10 MG tablet TAKE ONE TABLET BY MOUTH EVERYDAY AT BEDTIME  . Multiple Vitamins-Minerals (CENTRUM SILVER PO) Take 1 tablet by mouth daily.  Marland Kitchen oxybutynin (DITROPAN) 5 MG tablet Take 1 tablet (5 mg total) by mouth 3 (three) times daily.  . pregabalin (LYRICA) 300 MG capsule Take 1 capsule (300 mg total) by mouth every evening.  . rosuvastatin (CRESTOR) 10 MG tablet TAKE 1 TABLET BY MOUTH ONCE DAILY. **DUEFOR FOLLOW-UP OFFICE VISIT AND LABS, NO REFILLS UNTIL SEEN**  . sertraline (ZOLOFT) 100 MG tablet Take 1 tablet (100 mg total) by mouth 2 (two) times daily.  . TRESIBA FLEXTOUCH 100 UNIT/ML FlexTouch Pen INJECT 80 UNITS SUBQ ONCE DAILY AT BEDTIME  . Vitamin D, Cholecalciferol, 25 MCG (1000 UT) CAPS Take by mouth.  . [DISCONTINUED] diphenhydrAMINE (BENADRYL) 50 MG tablet Take 50 mg by mouth at bedtime.  . [DISCONTINUED] Melatonin 1 MG TABS Take 5 mg by mouth at bedtime. (Patient not taking: Reported on 11/14/2019)   No facility-administered encounter medications on file as of 01/02/2020.     Current Diagnosis/Assessment:  Goals Addressed   None    Hyperlipidemia   LDL goal <  70  Lipid Panel     Component Value Date/Time   CHOL 165 09/19/2019 1553   TRIG 140 09/19/2019 1553   HDL 62 09/19/2019 1553   LDLCALC 79 09/19/2019 1553    Hepatic Function Latest Ref Rng & Units 09/19/2019 06/13/2019  Total Protein 6.0 - 8.5 g/dL 7.0 6.3  Albumin 3.8 - 4.8 g/dL 4.7 4.2  AST 0 - 40 IU/L 43(H) 32  ALT 0 - 32 IU/L 31 23  Alk Phosphatase 48 - 121 IU/L 118 102  Total Bilirubin 0.0 - 1.2 mg/dL 0.3 0.3     The 10-year ASCVD risk score Mikey Bussing  DC Jr., et al., 2013) is: 14.6%   Values used to calculate the score:     Age: 84 years     Sex: Female     Is Non-Hispanic African American: No     Diabetic: Yes     Tobacco smoker: No     Systolic Blood Pressure: 532 mmHg     Is BP treated: Yes     HDL Cholesterol: 62 mg/dL     Total Cholesterol: 165 mg/dL   Patient has failed these meds in past: none reported Patient is currently uncontrolled on the following medications:  . Rosuvastatin 10 mg daily We discussed:  diet and exercise extensively. Consider increase of rosuvastatin to 20 mg daily if next LDL above goal.   Plan  Continue current medications Hypertension   BP today is:  <130/80  Office blood pressures are  BP Readings from Last 3 Encounters:  01/01/20 (!) 142/78  09/19/19 (!) 142/76  07/11/19 (!) 174/88    Patient has failed these meds in the past: none reported Patient is currently controlled on the following medications: furosemide 20 mg daily, lisinopril 40 mg bid Patient checks BP at home daily  Patient home BP readings are ranging: 130/75 mmHg  We discussed diet and exercise extensively. Patient still notes some leg swelling but wearing compression stockings and using unna boots as needed. No longer leg weeping with current regimen.   Plan  Continue current medications      Diabetes   Recent Relevant Labs: Lab Results  Component Value Date/Time   HGBA1C 7.3 (H) 09/19/2019 03:53 PM   HGBA1C 8.7 (H) 06/13/2019 10:33 AM   HGBA1C 7.5 02/12/2019 12:00 AM     Checking BG: Daily  Recent FBG Readings:100 mg/dL  Patient has failed these meds in past: metformin Patient is currently uncontrolled on the following medications: glimepiride 4 mg daily, tresiba 80 units qhs, trulicity 1.5 mg weekly, pen needles  Last diabetic Foot exam: No results found for: HMDIABEYEEXA  Last diabetic Eye exam: No results found for: HMDIABFOOTEX   We discussed: diet and exercise extensively. Patient currently has  Trulicity through patient assistance. Discussed Tyler Aas and pen needles through patient assistance. Pharmacist will coordinate paperwork with patient and provider. Pharmacist provided sample of Tresiba in office today. Patient is hopeful next blood work will be improved. Will consider increased dose of Trulicity to 3 mg weekly and potential discontinue of glimepiride.   Plan  Continue current medications and  Other Diagnosis:Depression Managed by Dr. Toy Care    Patient has failed these meds in past: none reported Patient is currently controlled on the following medications: alprazolam 1 mg qid prn, amitriptyline 24 mg qhs, sertraline 100 mg bid We discussed:  Patient reports that her daughter passed away 10 years ago. It is still difficult for patient and has some episodes of tears. She reports overall  well controlled. Discussed fall risk with patient using amitriptyline, alprazolam and benadryl. Patient is willing to work to trial reducing benadryl to 25 mg daily for 1 week and then stop if tolerated.   Plan  Continue current medications  Pain   Patient has failed these meds in past: none reported Patient is currently controlled on the following medications:  . Acetaminophen/Caffeine/Aspirin OTC 2 tablets daily   . Lyrica 300 mg qpm  We discussed:  Patient has tingling/neuropathy in legs. Overall improved with treatment. Patient was recently increased to Lyrica 300 mg bid. She did not tolerate due to daytime somnolence. Patient has reduced to 300 mg qpm and tolerating well.   Plan  Continue current medications Osteopenia / Osteoporosis   Last DEXA Scan: not available  No results found for: VD25OH   Patient has failed these meds in past: none reported Patient is currently uncontrolled on the following medications:  Marland Kitchen Vitamin D 25 mcg (1000 unit) daily  We discussed:  Recommend [phone] units of vitamin D daily. Recommend 1200 mg of calcium daily from dietary and supplemental  sources. Recommend weight-bearing and muscle strengthening exercises for building and maintaining bone density. Patient does not remember last Dexa scan date or mammogram. Last mammogram on file is 03/2018. Would like to set in [place] if possible.   Plan  Continue current medications  Health Maintenance   Patient is currently controlled on the following medications:  . Oxybutynin 5 mg tid - bladder . Benadryl 50 mg qpm - sleep/nasal congestion/skin itching  We discussed:  Patient reports that she uses the benadryl for allergies/congestion as well as itchy skin. Pharmacist counseled on the risks associated with diphenhydramine, pregabalin, amitriptyline and alprazolam. Patient is going to work on reducing/discontinuing diphenhydramine. Patient is interested in something to help her allergies/nasal congestion if possible. Patient requested montelukast if Dr. Tobie Poet approves.   Plan  Consider beginning Montelukast 10 mg daily for allergy symptoms.  Vaccines   Reviewed and discussed patient's vaccination history.  Patient is not interested in [place] shot at this time. Her family has all been vaccinated at this point. She doesn't see benefit since vaccine not developed for current strains of COVID.   Immunization History  Administered Date(s) Administered  . Fluad Quad(high Dose 65+) 01/01/2020  . Pneumococcal Polysaccharide-23 01/01/2020    Plan  Recommended patient discuss COVID vaccine at next office visit.  Medication Management   Pt uses [facility] for all medications Uses pill box? Yes Pt endorses good compliance  We discussed: Patient interested in pharmacy delivery and adherence packaging.   Verbal consent obtained for [facility] enhanced pharmacy services (medication synchronization, adherence packaging, delivery coordination). A medication sync plan was created to allow patient to get all medications delivered once every 30 to 90 days per patient preference.  Patient understands they have freedom to choose pharmacy and clinical pharmacist will coordinate care between all prescribers and [facility].    Plan  Utilize [facility] for medication synchronization, packaging and delivery    Follow up: 1 month phone visit

## 2020-01-06 ENCOUNTER — Encounter: Payer: Self-pay | Admitting: Family Medicine

## 2020-01-09 DIAGNOSIS — Z794 Long term (current) use of insulin: Secondary | ICD-10-CM | POA: Diagnosis not present

## 2020-01-09 DIAGNOSIS — E119 Type 2 diabetes mellitus without complications: Secondary | ICD-10-CM | POA: Diagnosis not present

## 2020-01-23 DIAGNOSIS — M6281 Muscle weakness (generalized): Secondary | ICD-10-CM | POA: Diagnosis not present

## 2020-01-23 DIAGNOSIS — G545 Neuralgic amyotrophy: Secondary | ICD-10-CM | POA: Diagnosis not present

## 2020-01-23 DIAGNOSIS — R262 Difficulty in walking, not elsewhere classified: Secondary | ICD-10-CM | POA: Diagnosis not present

## 2020-01-23 DIAGNOSIS — E119 Type 2 diabetes mellitus without complications: Secondary | ICD-10-CM | POA: Diagnosis not present

## 2020-01-23 DIAGNOSIS — M6259 Muscle wasting and atrophy, not elsewhere classified, multiple sites: Secondary | ICD-10-CM | POA: Diagnosis not present

## 2020-01-23 DIAGNOSIS — S82832A Other fracture of upper and lower end of left fibula, initial encounter for closed fracture: Secondary | ICD-10-CM | POA: Diagnosis not present

## 2020-01-23 DIAGNOSIS — R296 Repeated falls: Secondary | ICD-10-CM | POA: Diagnosis not present

## 2020-01-23 DIAGNOSIS — R29898 Other symptoms and signs involving the musculoskeletal system: Secondary | ICD-10-CM | POA: Diagnosis not present

## 2020-01-23 DIAGNOSIS — Z7401 Bed confinement status: Secondary | ICD-10-CM | POA: Diagnosis not present

## 2020-01-23 DIAGNOSIS — E1169 Type 2 diabetes mellitus with other specified complication: Secondary | ICD-10-CM | POA: Diagnosis not present

## 2020-01-23 DIAGNOSIS — I700 Atherosclerosis of aorta: Secondary | ICD-10-CM | POA: Diagnosis not present

## 2020-01-23 DIAGNOSIS — Z79899 Other long term (current) drug therapy: Secondary | ICD-10-CM | POA: Diagnosis not present

## 2020-01-23 DIAGNOSIS — Z794 Long term (current) use of insulin: Secondary | ICD-10-CM | POA: Diagnosis not present

## 2020-01-23 DIAGNOSIS — R52 Pain, unspecified: Secondary | ICD-10-CM | POA: Diagnosis not present

## 2020-01-23 DIAGNOSIS — F32A Depression, unspecified: Secondary | ICD-10-CM | POA: Diagnosis not present

## 2020-01-23 DIAGNOSIS — Z743 Need for continuous supervision: Secondary | ICD-10-CM | POA: Diagnosis not present

## 2020-01-23 DIAGNOSIS — S0990XA Unspecified injury of head, initial encounter: Secondary | ICD-10-CM | POA: Diagnosis not present

## 2020-01-23 DIAGNOSIS — E785 Hyperlipidemia, unspecified: Secondary | ICD-10-CM | POA: Diagnosis not present

## 2020-01-23 DIAGNOSIS — R531 Weakness: Secondary | ICD-10-CM | POA: Diagnosis not present

## 2020-01-23 DIAGNOSIS — E1144 Type 2 diabetes mellitus with diabetic amyotrophy: Secondary | ICD-10-CM | POA: Diagnosis not present

## 2020-01-23 DIAGNOSIS — R0989 Other specified symptoms and signs involving the circulatory and respiratory systems: Secondary | ICD-10-CM | POA: Diagnosis not present

## 2020-01-23 DIAGNOSIS — Z9181 History of falling: Secondary | ICD-10-CM | POA: Diagnosis not present

## 2020-01-23 DIAGNOSIS — M7989 Other specified soft tissue disorders: Secondary | ICD-10-CM | POA: Diagnosis not present

## 2020-01-23 DIAGNOSIS — W19XXXA Unspecified fall, initial encounter: Secondary | ICD-10-CM | POA: Diagnosis not present

## 2020-01-24 ENCOUNTER — Ambulatory Visit: Payer: Medicare Other

## 2020-01-24 DIAGNOSIS — S82832A Other fracture of upper and lower end of left fibula, initial encounter for closed fracture: Secondary | ICD-10-CM | POA: Diagnosis not present

## 2020-01-24 DIAGNOSIS — W19XXXA Unspecified fall, initial encounter: Secondary | ICD-10-CM | POA: Diagnosis not present

## 2020-01-24 DIAGNOSIS — R0989 Other specified symptoms and signs involving the circulatory and respiratory systems: Secondary | ICD-10-CM | POA: Diagnosis not present

## 2020-01-24 DIAGNOSIS — R296 Repeated falls: Secondary | ICD-10-CM | POA: Diagnosis not present

## 2020-01-24 DIAGNOSIS — E1144 Type 2 diabetes mellitus with diabetic amyotrophy: Secondary | ICD-10-CM | POA: Diagnosis not present

## 2020-01-24 NOTE — Chronic Care Management (AMB) (Signed)
Pharmacist reached out to patient for follow-up visit. Patient currently admitted in the hospital per daughter. Will follow-up once discharged.   Sherre Poot, PharmD, [facility] Clinical Pharmacist [facility] [phone] (office) [phone] (mobile)

## 2020-01-25 DIAGNOSIS — S82832A Other fracture of upper and lower end of left fibula, initial encounter for closed fracture: Secondary | ICD-10-CM | POA: Diagnosis not present

## 2020-01-25 DIAGNOSIS — E1144 Type 2 diabetes mellitus with diabetic amyotrophy: Secondary | ICD-10-CM | POA: Diagnosis not present

## 2020-01-26 DIAGNOSIS — E1144 Type 2 diabetes mellitus with diabetic amyotrophy: Secondary | ICD-10-CM | POA: Diagnosis not present

## 2020-01-26 DIAGNOSIS — S82832A Other fracture of upper and lower end of left fibula, initial encounter for closed fracture: Secondary | ICD-10-CM | POA: Diagnosis not present

## 2020-01-27 DIAGNOSIS — S82832A Other fracture of upper and lower end of left fibula, initial encounter for closed fracture: Secondary | ICD-10-CM | POA: Diagnosis not present

## 2020-01-27 DIAGNOSIS — E1144 Type 2 diabetes mellitus with diabetic amyotrophy: Secondary | ICD-10-CM | POA: Diagnosis not present

## 2020-01-28 DIAGNOSIS — S82832A Other fracture of upper and lower end of left fibula, initial encounter for closed fracture: Secondary | ICD-10-CM | POA: Diagnosis not present

## 2020-01-28 DIAGNOSIS — E1144 Type 2 diabetes mellitus with diabetic amyotrophy: Secondary | ICD-10-CM | POA: Diagnosis not present

## 2020-01-29 DIAGNOSIS — S82832A Other fracture of upper and lower end of left fibula, initial encounter for closed fracture: Secondary | ICD-10-CM | POA: Diagnosis not present

## 2020-01-29 DIAGNOSIS — E1144 Type 2 diabetes mellitus with diabetic amyotrophy: Secondary | ICD-10-CM | POA: Diagnosis not present

## 2020-01-30 DIAGNOSIS — E1144 Type 2 diabetes mellitus with diabetic amyotrophy: Secondary | ICD-10-CM | POA: Diagnosis not present

## 2020-01-30 DIAGNOSIS — R29898 Other symptoms and signs involving the musculoskeletal system: Secondary | ICD-10-CM | POA: Diagnosis not present

## 2020-01-30 DIAGNOSIS — Z743 Need for continuous supervision: Secondary | ICD-10-CM | POA: Diagnosis not present

## 2020-01-30 DIAGNOSIS — Z7401 Bed confinement status: Secondary | ICD-10-CM | POA: Diagnosis not present

## 2020-01-30 DIAGNOSIS — R262 Difficulty in walking, not elsewhere classified: Secondary | ICD-10-CM | POA: Diagnosis not present

## 2020-01-30 DIAGNOSIS — F32A Depression, unspecified: Secondary | ICD-10-CM | POA: Diagnosis not present

## 2020-01-30 DIAGNOSIS — M6259 Muscle wasting and atrophy, not elsewhere classified, multiple sites: Secondary | ICD-10-CM | POA: Diagnosis not present

## 2020-01-30 DIAGNOSIS — E1169 Type 2 diabetes mellitus with other specified complication: Secondary | ICD-10-CM | POA: Diagnosis not present

## 2020-01-30 DIAGNOSIS — R296 Repeated falls: Secondary | ICD-10-CM | POA: Diagnosis not present

## 2020-01-30 DIAGNOSIS — E119 Type 2 diabetes mellitus without complications: Secondary | ICD-10-CM | POA: Diagnosis not present

## 2020-01-30 DIAGNOSIS — R52 Pain, unspecified: Secondary | ICD-10-CM | POA: Diagnosis not present

## 2020-01-30 DIAGNOSIS — G545 Neuralgic amyotrophy: Secondary | ICD-10-CM | POA: Diagnosis not present

## 2020-01-30 DIAGNOSIS — S82832A Other fracture of upper and lower end of left fibula, initial encounter for closed fracture: Secondary | ICD-10-CM | POA: Diagnosis not present

## 2020-01-30 DIAGNOSIS — M6281 Muscle weakness (generalized): Secondary | ICD-10-CM | POA: Diagnosis not present

## 2020-01-31 DIAGNOSIS — R262 Difficulty in walking, not elsewhere classified: Secondary | ICD-10-CM | POA: Diagnosis not present

## 2020-01-31 DIAGNOSIS — G545 Neuralgic amyotrophy: Secondary | ICD-10-CM | POA: Diagnosis not present

## 2020-01-31 DIAGNOSIS — E119 Type 2 diabetes mellitus without complications: Secondary | ICD-10-CM | POA: Diagnosis not present

## 2020-02-01 ENCOUNTER — Telehealth: Payer: Self-pay

## 2020-02-01 NOTE — Chronic Care Management (AMB) (Signed)
Reached out to patient's daughter to discuss recent blood sugar readings. Paitent's daughter indicates patient is currently hospitalized to evaluate recent falls. Daughter will reach out to pharmacist once patient is back home to determine medication changes and needs.   Sherre Poot, PharmD, [facility] Clinical Pharmacist [facility] [phone] (office) [phone] (mobile)

## 2020-02-04 ENCOUNTER — Other Ambulatory Visit: Payer: Self-pay | Admitting: Family Medicine

## 2020-02-04 ENCOUNTER — Telehealth: Payer: Self-pay

## 2020-02-04 NOTE — Chronic Care Management (AMB) (Signed)
Patient's daughter called to request information about DME products. Ms. Ilagan is currently at [facility] in [place]. Patient wants to come home for in-home therapy. Daughter was inquiring on options for bedside toilet.   Pharmacist consulted referral coordinator in office. Recommended orders be completed with discharge from [facility]. Patient will schedule transition of care visit with Dr. Tobie Poet once discharged and stable.   Daughter is not certain of current blood sugar readings or medications used while in facility. Will contact pharmacist with upcoming needs or questions.   Sherre Poot, PharmD, [facility] Clinical Pharmacist [facility] [phone] (office) [phone] (mobile)

## 2020-02-06 ENCOUNTER — Telehealth: Payer: Self-pay

## 2020-02-06 NOTE — Chronic Care Management (AMB) (Signed)
Chronic Care Management Pharmacy Assistant   Name: Christy Crawford  MRN: [id number] DOB: [birth year]  Reason for Encounter: Medication Review/ Monthly Dispensing Call  Patient Questions:  1.  Have you seen any other providers since your last visit? Yes, 01/01/2020- Christy Crawford (PCP), 01/23/2020- 01/30/2020 [facility] Admission, 01/30/2020- 02/05/2020- Christy Crawford in [place].   2.  Any changes in your medicines or health? Yes, Diphenhydramine 50 mg and Melatonin 5 mg discontinued on 01/01/2020, 01/30/2020- Guaifenesin 10 ml started for PRN use for cough.    PCP : Christy Brome, MD  Allergies:  Not on File  Medications: Outpatient Encounter Medications as of 02/06/2020  Medication Sig  . acetaminophen (TYLENOL) 325 MG tablet Take 325 mg by mouth every 6 (six) hours as needed. (Patient not taking: Reported on 11/14/2019)  . ALPRAZolam (XANAX) 1 MG tablet Take 1 mg by mouth in the morning, at noon, in the evening, and at bedtime.   Marland Kitchen amitriptyline (ELAVIL) 25 MG tablet Take 1 tablet (25 mg total) by mouth at bedtime.  Marland Kitchen aspirin-acetaminophen-caffeine (EXCEDRIN MIGRAINE) 250-250-65 MG tablet Take 2 tablets by mouth in the morning.  [name] Choice Lancets 28G MISC E11.4 Use new lancet each time when checking blood sugar  . COMFORT EZ PEN NEEDLES 31G X 5 MM MISC E11.44 use new needle with each new injection  . Dulaglutide (TRULICITY) 3 PP/5.0DT SOPN Inject 0.5 mLs (3 mg total) as directed once a week.  . furosemide (LASIX) 20 MG tablet Take 1 tablet (20 mg total) by mouth daily.  Marland Kitchen glimepiride (AMARYL) 4 MG tablet Take 1 tablet (4 mg total) by mouth daily.  Marland Kitchen glucose blood (CLEVER CHOICE MICRO TEST) test strip E11.44 Use new strip each time when checking blood sugar. Check sugar 1-3 times daily.  Marland Kitchen lisinopril (ZESTRIL) 40 MG tablet TAKE 1 TABLET BY MOUTH EVERY MORNING AND1 TABLET BY MOUTH AT BEDTIME  . montelukast (SINGULAIR) 10 MG tablet TAKE ONE TABLET BY MOUTH EVERYDAY  AT BEDTIME  . Multiple Vitamins-Minerals (CENTRUM SILVER PO) Take 1 tablet by mouth daily.  Marland Kitchen oxybutynin (DITROPAN) 5 MG tablet Take 1 tablet (5 mg total) by mouth 3 (three) times daily.  . pregabalin (LYRICA) 300 MG capsule Take 1 capsule (300 mg total) by mouth every evening.  . rosuvastatin (CRESTOR) 10 MG tablet TAKE 1 TABLET BY MOUTH ONCE DAILY. **DUEFOR FOLLOW-UP OFFICE VISIT AND LABS, NO REFILLS UNTIL SEEN**  . sertraline (ZOLOFT) 100 MG tablet Take 1 tablet (100 mg total) by mouth 2 (two) times daily.  . TRESIBA FLEXTOUCH 100 UNIT/ML FlexTouch Pen INJECT 80 UNITS SUBQ ONCE DAILY AT BEDTIME  . Vitamin D, Cholecalciferol, 25 MCG (1000 UT) CAPS Take by mouth.   No facility-administered encounter medications on file as of 02/06/2020.    Current Diagnosis: Patient Active Problem List   Diagnosis Date Noted  . Pedal edema 09/23/2019  . Fibromyalgia 07/11/2019  . Class 3 severe obesity due to excess calories with serious comorbidity and body mass index (BMI) of 40.0 to 44.9 in adult ([place]) 07/11/2019  . Chronic right-sided low back pain without sciatica 06/21/2019  . 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 Chronic pain of both shoulders 06/21/2019  . Chronic pain of right knee 06/21/2019  . Dyslipidemia associated with type 2 diabetes mellitus ([place]) 06/13/2019  . Mixed hyperlipidemia 06/13/2019   Reviewed chart for medication changes ahead of medication coordination call.  OVs, Consults, or hospital visits- 01/01/2020-  Christy Crawford (PCP), 01/23/2020- 01/30/2020 [facility] Admission, 01/30/2020- 02/05/2020- [place] in [place] since last care coordination call/Pharmacist visit.   Medication changes indicated:Diphenhydramine 50 mg and Melatonin 5 mg discontinued on 01/01/2020, 01/30/2020- Guaifenesin 10 ml started for PRN use for cough.    BP Readings from Last 3 Encounters:  01/01/20 (!) 142/78  09/19/19 (!) 142/76    07/11/19 (!) 174/88    Lab Results  Component Value Date   HGBA1C 7.0 (H) 01/01/2020     Patient obtains medications through Adherence Packaging  30 Days   Last adherence delivery included:  01/01/2020- Rosuvastatin 10 mg- 1 tablet daily (35 day supply) 12/19/2019-Sertraline 100 mg- 1 tablet twice daily (54 day supply)        Oxybutynin 5 mg- 1 tablet three times daily( 44 day supply)         Glimepiride 4 mg- 1 tablet daily( 50 day supply).  Patient declined Amitriptyline 25 mg, Furosemide 20 mg last month due to receiving a 75 day supply on 11/23/2019. Patient declined Montelukast 10 mg, Pregabalin 300 mg, Lisinopril 40 mg and Trulicity 3 RK/2.7 ml last month due to having an adequate supply from last fill from [facility].  Patient is due for next adherence delivery on: 02/08/2020. 02/06/2020-Called patient and daughter- Christy Crawford three times, no answer, left message for daughter to return call to review medications and coordinate delivery. 02/07/2020- Spoke with daughter Christy Crawford, reviewed medications and determined that no delivery needed at this time.  This delivery to include: Patient does not need any refills at this time due to have a recent Hospitalization and Skilled Nursing stay for Rehabilitation. Patient was given supply of medications at discharge: Furosemide 20 mg- #24 on hand. Rosuvastatin 10 mg- #24 on hand. Glimepiride 4 mg- #22 on hand. Montelukast 10 mg- #22 on hand.  Amitriptyline 25 mg- #21 on hand. Oxybutynin 5 mg- #70 on hand. Sertraline 100 mg- #48 on hand.  Pregabalin 300 mg just picked up for a 90 day supply on 02/06/2020 from [facility] Patient has 6 pens on hand of Tresiba. Patient has 17 pens on hand for daily use of Trulicity.  Patient takes Xanax 1 mg- 1 tablet three times a day which is being prescribed by Psychiatrist.  Patient daughter did not see Lisinopril 40 mg- 1 tablet twice daily when reviewing medications. Patients husband found  medication and has given again on 02/06/2020, #30 on hand. `  Coordinating acute fills for medications to be delivered on 02/21/2020:  Lisinopril 40 mg, Amitriptyline 25 mg, Glimepiride 4 mg, Montelukast 10 mg and  02/25/2020: Furosemide 20 mg, Rosuvastatin 10 mg, Oxybutynin 5 mg and Sertraline 100 mg.   Patient declined the following medications Lyrica due to receiving a 90 day supply with [facility] and Xanax due to getting prescribed from Psychiatrist, not being filled with [facility]. Trulicity declined due to receiving from Patient Assistance with [employer] and Patient Assistance is being worked on for [employer] from [employer].  Patient needs refill of Furosemide 20 prior to future fill.  Patient's daughter Christy Crawford aware that the pharmacy will call her the day of scheduled delivery's to coordinate time.   Follow-Up:  Comptroller and Pharmacist Review  Daughter states [place] Aas PAP is needed. Per Donette Larry, PAP has been filled out back in August, just awaiting patient signature and income documents. Daughter aware. Daughter also advised to schedule a hospital follow up visit with Christy Tobie Poet.  Daughter  mentioned that patient had a terrible experience at [facility] for Rehabilitation. Patent was getting bed sores, patient did not have PT done there, patient went without her Xanax and Lisinopril while at the facility. Daughter stated they just took her out due to treatment of patient.  Daughter has received some DME supplies for patient- wheelchair and shower chair arrived.  Daughter aware to continue Lisinopril 40 mg for patient per Pharmacist, medication is needed to help protect her kidneys.  Daughter very thankful for the help and coordination of medication and DME supplies. Donette Larry, CPP notified.  Pattricia Boss, [place] Pharmacist Assistant [phone]

## 2020-02-08 DIAGNOSIS — R29898 Other symptoms and signs involving the musculoskeletal system: Secondary | ICD-10-CM | POA: Diagnosis not present

## 2020-02-08 DIAGNOSIS — F32A Depression, unspecified: Secondary | ICD-10-CM | POA: Diagnosis not present

## 2020-02-08 DIAGNOSIS — M797 Fibromyalgia: Secondary | ICD-10-CM | POA: Diagnosis not present

## 2020-02-08 DIAGNOSIS — S82832D Other fracture of upper and lower end of left fibula, subsequent encounter for closed fracture with routine healing: Secondary | ICD-10-CM | POA: Diagnosis not present

## 2020-02-08 DIAGNOSIS — R296 Repeated falls: Secondary | ICD-10-CM | POA: Diagnosis not present

## 2020-02-08 DIAGNOSIS — R262 Difficulty in walking, not elsewhere classified: Secondary | ICD-10-CM | POA: Diagnosis not present

## 2020-02-08 DIAGNOSIS — E1144 Type 2 diabetes mellitus with diabetic amyotrophy: Secondary | ICD-10-CM | POA: Diagnosis not present

## 2020-02-08 DIAGNOSIS — M6281 Muscle weakness (generalized): Secondary | ICD-10-CM | POA: Diagnosis not present

## 2020-02-08 DIAGNOSIS — E7800 Pure hypercholesterolemia, unspecified: Secondary | ICD-10-CM | POA: Diagnosis not present

## 2020-02-08 DIAGNOSIS — I10 Essential (primary) hypertension: Secondary | ICD-10-CM | POA: Diagnosis not present

## 2020-02-08 DIAGNOSIS — Z87891 Personal history of nicotine dependence: Secondary | ICD-10-CM | POA: Diagnosis not present

## 2020-02-09 DIAGNOSIS — R262 Difficulty in walking, not elsewhere classified: Secondary | ICD-10-CM | POA: Diagnosis not present

## 2020-02-09 DIAGNOSIS — S82832A Other fracture of upper and lower end of left fibula, initial encounter for closed fracture: Secondary | ICD-10-CM | POA: Diagnosis not present

## 2020-02-09 DIAGNOSIS — R269 Unspecified abnormalities of gait and mobility: Secondary | ICD-10-CM | POA: Diagnosis not present

## 2020-02-09 DIAGNOSIS — G545 Neuralgic amyotrophy: Secondary | ICD-10-CM | POA: Diagnosis not present

## 2020-02-12 ENCOUNTER — Telehealth: Payer: Self-pay

## 2020-02-12 NOTE — Chronic Care Management (AMB) (Signed)
    Chronic Care Management Pharmacy Assistant   Name: Christy Crawford  MRN: [id number] DOB: Jun 03, 1955  Reason for Encounter: Medication Review - Patient Assistance Coordination. Marland Kitchen  PCP : [name], MD  Allergies:  Not on File  Medications: Outpatient Encounter Medications as of 02/12/2020  Medication Sig  . acetaminophen (TYLENOL) 325 MG tablet Take 325 mg by mouth every 6 (six) hours as needed. (Patient not taking: Reported on 11/14/2019)  . ALPRAZolam (XANAX) 1 MG tablet Take 1 mg by mouth in the morning, at noon, in the evening, and at bedtime.   Marland Kitchen amitriptyline (ELAVIL) 25 MG tablet Take 1 tablet (25 mg total) by mouth at bedtime.  Marland Kitchen aspirin-acetaminophen-caffeine (EXCEDRIN MIGRAINE) 250-250-65 MG tablet Take 2 tablets by mouth in the morning.  [name] Choice Lancets 28G MISC E11.4 Use new lancet each time when checking blood sugar  . COMFORT EZ PEN NEEDLES 31G X 5 MM MISC E11.44 use new needle with each new injection  . Dulaglutide (TRULICITY) 3 MY/1.1ZN SOPN Inject 0.5 mLs (3 mg total) as directed once a week.  . furosemide (LASIX) 20 MG tablet Take 1 tablet (20 mg total) by mouth daily.  Marland Kitchen glimepiride (AMARYL) 4 MG tablet Take 1 tablet (4 mg total) by mouth daily.  Marland Kitchen glucose blood (CLEVER CHOICE MICRO TEST) test strip E11.44 Use new strip each time when checking blood sugar. Check sugar 1-3 times daily.  Marland Kitchen lisinopril (ZESTRIL) 40 MG tablet TAKE 1 TABLET BY MOUTH EVERY MORNING AND1 TABLET BY MOUTH AT BEDTIME  . montelukast (SINGULAIR) 10 MG tablet TAKE ONE TABLET BY MOUTH EVERYDAY AT BEDTIME  . Multiple Vitamins-Minerals (CENTRUM SILVER PO) Take 1 tablet by mouth daily.  Marland Kitchen oxybutynin (DITROPAN) 5 MG tablet Take 1 tablet (5 mg total) by mouth 3 (three) times daily.  . pregabalin (LYRICA) 300 MG capsule Take 1 capsule (300 mg total) by mouth every evening.  . rosuvastatin (CRESTOR) 10 MG tablet TAKE 1 TABLET BY MOUTH ONCE DAILY. **DUEFOR FOLLOW-UP OFFICE VISIT AND LABS, NO REFILLS  UNTIL SEEN**  . sertraline (ZOLOFT) 100 MG tablet Take 1 tablet (100 mg total) by mouth 2 (two) times daily.  . TRESIBA FLEXTOUCH 100 UNIT/ML FlexTouch Pen INJECT 80 UNITS SUBQ ONCE DAILY AT BEDTIME  . Vitamin D, Cholecalciferol, 25 MCG (1000 UT) CAPS Take by mouth.   No facility-administered encounter medications on file as of 02/12/2020.    Current Diagnosis: Patient Active Problem List   Diagnosis Date Noted  . Pedal edema 09/23/2019  . Fibromyalgia 07/11/2019  . Class 3 severe obesity due to excess calories with serious comorbidity and body mass index (BMI) of 40.0 to 44.9 in adult ([place]) 07/11/2019  . Chronic right-sided low back pain without sciatica 06/21/2019  . 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 Chronic pain of both shoulders 06/21/2019  . Chronic pain of right knee 06/21/2019  . Dyslipidemia associated with type 2 diabetes mellitus ([place]) 06/13/2019  . Mixed hyperlipidemia 06/13/2019      Follow-Up:  Patient Assistance Coordination New application filled out to [employer] for [place]. Awaiting provider signature to fax.  Elly Modena Owens Shark, [employer]. Notified   Judithann Sheen, [place] Pharmacist Assistant [phone]

## 2020-02-13 ENCOUNTER — Telehealth: Payer: Self-pay

## 2020-02-13 DIAGNOSIS — I10 Essential (primary) hypertension: Secondary | ICD-10-CM | POA: Diagnosis not present

## 2020-02-13 DIAGNOSIS — S82832D Other fracture of upper and lower end of left fibula, subsequent encounter for closed fracture with routine healing: Secondary | ICD-10-CM | POA: Diagnosis not present

## 2020-02-13 DIAGNOSIS — E1144 Type 2 diabetes mellitus with diabetic amyotrophy: Secondary | ICD-10-CM | POA: Diagnosis not present

## 2020-02-13 DIAGNOSIS — R29898 Other symptoms and signs involving the musculoskeletal system: Secondary | ICD-10-CM | POA: Diagnosis not present

## 2020-02-13 DIAGNOSIS — E7800 Pure hypercholesterolemia, unspecified: Secondary | ICD-10-CM | POA: Diagnosis not present

## 2020-02-13 DIAGNOSIS — R296 Repeated falls: Secondary | ICD-10-CM | POA: Diagnosis not present

## 2020-02-13 DIAGNOSIS — F32A Depression, unspecified: Secondary | ICD-10-CM | POA: Diagnosis not present

## 2020-02-13 DIAGNOSIS — R262 Difficulty in walking, not elsewhere classified: Secondary | ICD-10-CM | POA: Diagnosis not present

## 2020-02-13 DIAGNOSIS — Z87891 Personal history of nicotine dependence: Secondary | ICD-10-CM | POA: Diagnosis not present

## 2020-02-13 DIAGNOSIS — M6281 Muscle weakness (generalized): Secondary | ICD-10-CM | POA: Diagnosis not present

## 2020-02-13 DIAGNOSIS — M797 Fibromyalgia: Secondary | ICD-10-CM | POA: Diagnosis not present

## 2020-02-13 NOTE — Telephone Encounter (Signed)
I call both numbers listed in the pts chart. Unable to leave a message on the home number but was able to leave a message on the cell phone.  LM asking for pt to call back to schdule an appointment for a AWV. Last one was 02/20/2018.

## 2020-02-15 DIAGNOSIS — I10 Essential (primary) hypertension: Secondary | ICD-10-CM | POA: Diagnosis not present

## 2020-02-15 DIAGNOSIS — M797 Fibromyalgia: Secondary | ICD-10-CM | POA: Diagnosis not present

## 2020-02-15 DIAGNOSIS — Z87891 Personal history of nicotine dependence: Secondary | ICD-10-CM | POA: Diagnosis not present

## 2020-02-15 DIAGNOSIS — F32A Depression, unspecified: Secondary | ICD-10-CM | POA: Diagnosis not present

## 2020-02-15 DIAGNOSIS — M6281 Muscle weakness (generalized): Secondary | ICD-10-CM | POA: Diagnosis not present

## 2020-02-15 DIAGNOSIS — R296 Repeated falls: Secondary | ICD-10-CM | POA: Diagnosis not present

## 2020-02-15 DIAGNOSIS — E7800 Pure hypercholesterolemia, unspecified: Secondary | ICD-10-CM | POA: Diagnosis not present

## 2020-02-15 DIAGNOSIS — S82832D Other fracture of upper and lower end of left fibula, subsequent encounter for closed fracture with routine healing: Secondary | ICD-10-CM | POA: Diagnosis not present

## 2020-02-15 DIAGNOSIS — R29898 Other symptoms and signs involving the musculoskeletal system: Secondary | ICD-10-CM | POA: Diagnosis not present

## 2020-02-15 DIAGNOSIS — E1144 Type 2 diabetes mellitus with diabetic amyotrophy: Secondary | ICD-10-CM | POA: Diagnosis not present

## 2020-02-15 DIAGNOSIS — R262 Difficulty in walking, not elsewhere classified: Secondary | ICD-10-CM | POA: Diagnosis not present

## 2020-02-19 ENCOUNTER — Telehealth: Payer: Self-pay

## 2020-02-19 NOTE — Chronic Care Management (AMB) (Signed)
Spoke with daughter to alert that patient assistance approved for [place]. Daughter provided with number to call [employer] PAP for a 1 month voucher supply until [place] arrives in to office.   [employer] Patient Assistance approval 02/14/2020 - 04/04/2020.   Patient's daughter had questions on resources available to help transport her mom to appointments and provide a caregiver in the home. Pharmacist referred daughter to contact the senior center to determine resources available. Daughter also advised to contact insurance for benefits and EMS for eligibility for non-emergent transport. Daughter vocalized understanding and thankful for help.   Sherre Poot, PharmD, [facility] Clinical Pharmacist [facility] [phone] (office) [phone] (mobile)

## 2020-02-21 ENCOUNTER — Other Ambulatory Visit: Payer: Self-pay | Admitting: Physician Assistant

## 2020-02-21 DIAGNOSIS — R6889 Other general symptoms and signs: Secondary | ICD-10-CM | POA: Diagnosis not present

## 2020-02-22 ENCOUNTER — Other Ambulatory Visit: Payer: Self-pay | Admitting: Family Medicine

## 2020-02-26 DIAGNOSIS — S82832D Other fracture of upper and lower end of left fibula, subsequent encounter for closed fracture with routine healing: Secondary | ICD-10-CM | POA: Diagnosis not present

## 2020-02-26 DIAGNOSIS — R262 Difficulty in walking, not elsewhere classified: Secondary | ICD-10-CM

## 2020-02-26 DIAGNOSIS — M6281 Muscle weakness (generalized): Secondary | ICD-10-CM | POA: Diagnosis not present

## 2020-02-26 DIAGNOSIS — E7800 Pure hypercholesterolemia, unspecified: Secondary | ICD-10-CM

## 2020-02-26 DIAGNOSIS — R296 Repeated falls: Secondary | ICD-10-CM

## 2020-02-26 DIAGNOSIS — R29898 Other symptoms and signs involving the musculoskeletal system: Secondary | ICD-10-CM | POA: Diagnosis not present

## 2020-02-26 DIAGNOSIS — Z87891 Personal history of nicotine dependence: Secondary | ICD-10-CM

## 2020-02-26 DIAGNOSIS — F32A Depression, unspecified: Secondary | ICD-10-CM

## 2020-02-26 DIAGNOSIS — I10 Essential (primary) hypertension: Secondary | ICD-10-CM | POA: Diagnosis not present

## 2020-02-26 DIAGNOSIS — E1144 Type 2 diabetes mellitus with diabetic amyotrophy: Secondary | ICD-10-CM | POA: Diagnosis not present

## 2020-02-26 DIAGNOSIS — M797 Fibromyalgia: Secondary | ICD-10-CM

## 2020-03-03 ENCOUNTER — Telehealth: Payer: Self-pay

## 2020-03-03 NOTE — Chronic Care Management (AMB) (Signed)
Chronic Care Management Pharmacy Assistant   Name: Christy Crawford  MRN: [id number] DOB: 04/24/1955  Reason for Encounter: Medication Review  Patient Questions:  1.  Have you seen any other providers since your last visit? No  2.  Any changes in your medicines or health? No     PCP : [name], MD  Allergies:  Not on File  Medications: Outpatient Encounter Medications as of 03/03/2020  Medication Sig  . acetaminophen (TYLENOL) 325 MG tablet Take 325 mg by mouth every 6 (six) hours as needed. (Patient not taking: Reported on 11/14/2019)  . ALPRAZolam (XANAX) 1 MG tablet Take 1 mg by mouth in the morning, at noon, in the evening, and at bedtime.   Marland Kitchen amitriptyline (ELAVIL) 25 MG tablet Take 1 tablet (25 mg total) by mouth at bedtime.  Marland Kitchen aspirin-acetaminophen-caffeine (EXCEDRIN MIGRAINE) 250-250-65 MG tablet Take 2 tablets by mouth in the morning.  [name] Choice Lancets 28G MISC E11.4 Use new lancet each time when checking blood sugar  . COMFORT EZ PEN NEEDLES 31G X 5 MM MISC E11.44 use new needle with each new injection  . Dulaglutide (TRULICITY) 3 BM/8.4XL SOPN Inject 0.5 mLs (3 mg total) as directed once a week.  . furosemide (LASIX) 20 MG tablet TAKE ONE TABLET BY MOUTH ONCE DAILY  . glimepiride (AMARYL) 4 MG tablet Take 1 tablet (4 mg total) by mouth daily.  Marland Kitchen glucose blood (CLEVER CHOICE MICRO TEST) test strip E11.44 Use new strip each time when checking blood sugar. Check sugar 1-3 times daily.  Marland Kitchen lisinopril (ZESTRIL) 40 MG tablet TAKE 1 TABLET BY MOUTH EVERY MORNING AND1 TABLET BY MOUTH AT BEDTIME  . montelukast (SINGULAIR) 10 MG tablet TAKE ONE TABLET BY MOUTH EVERYDAY AT BEDTIME  . Multiple Vitamins-Minerals (CENTRUM SILVER PO) Take 1 tablet by mouth daily.  Marland Kitchen oxybutynin (DITROPAN) 5 MG tablet Take 1 tablet (5 mg total) by mouth 3 (three) times daily.  . pregabalin (LYRICA) 300 MG capsule Take 1 capsule (300 mg total) by mouth every evening.  . rosuvastatin (CRESTOR) 10  MG tablet TAKE 1 TABLET BY MOUTH ONCE DAILY. **DUEFOR FOLLOW-UP OFFICE VISIT AND LABS, NO REFILLS UNTIL SEEN**  . sertraline (ZOLOFT) 100 MG tablet Take 1 tablet (100 mg total) by mouth 2 (two) times daily.  . TRESIBA FLEXTOUCH 100 UNIT/ML FlexTouch Pen INJECT 80 UNITS SUBQ ONCE DAILY AT BEDTIME  . Vitamin D, Cholecalciferol, 25 MCG (1000 UT) CAPS Take by mouth.   No facility-administered encounter medications on file as of 03/03/2020.    Current Diagnosis: Patient Active Problem List   Diagnosis Date Noted  . Pedal edema 09/23/2019  . Fibromyalgia 07/11/2019  . Class 3 severe obesity due to excess calories with serious comorbidity and body mass index (BMI) of 40.0 to 44.9 in adult ([place]) 07/11/2019  . Chronic right-sided low back pain without sciatica 06/21/2019  . 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 Chronic pain of both shoulders 06/21/2019  . Chronic pain of right knee 06/21/2019  . Dyslipidemia associated with type 2 diabetes mellitus ([place]) 06/13/2019  . Mixed hyperlipidemia 06/13/2019   Reviewed chart for medication changes ahead of medication coordination call.  No OVs, Consults, or hospital visits since last care coordination call/Pharmacist visit. (If appropriate, list visit date, provider name)  No medication changes indicated OR if recent visit, treatment plan here.  BP Readings from Last 3 Encounters:  01/01/20 (!) 142/78  09/19/19 (!) 142/76  07/11/19 Marland Kitchen)  174/88    Lab Results  Component Value Date   HGBA1C 7.0 (H) 01/01/2020     Patient obtains medications through Adherence Packaging  30 Days   Last adherence delivery included: Did not receive medications due to having an abundance on hand from recent hospital and skilled nursing facility stay.   Patient declined the following medications due to having an abundance on hand. Furosemide 20 mg- #24 on hand. Rosuvastatin 10 mg- #24 on hand. Glimepiride 4 mg- #22 on  hand. Montelukast 10 mg- #22 on hand.  Amitriptyline 25 mg- #21 on hand. Oxybutynin 5 mg- #70 on hand. Sertraline 100 mg- #48 on hand.  Pregabalin 300 mg just picked up for a 90 day supply on 02/06/2020 from [facility]. Patient has 6 pens on hand of Tresiba. Patient has 17 pens on hand for daily use of Trulicity.  Patient takes Xanax 1 mg- 1 tablet three times a day which is being prescribed by Psychiatrist.  Patient daughter did not see Lisinopril 40 mg- 1 tablet twice daily when reviewing medications. Patients husband found medication and has given again on 02/06/2020, #30 on hand. `  Abundance due to recent hospital and skilled nursing facility stay.   Patient is due for next adherence delivery on: 03/09/2020  Called patient and reviewed medications and coordinated delivery.  This delivery to include:  Alprazolam 1 mg 1 tablet 4 times daily as needed Pregabalin 300 mg 1 capsule daily (breakfast)  Patient will not need a short fill of medications.   Coordinated acute fill to be delivered on 03/18/20 for the following medications: Sertraline 100 mg 1 tablet twice daily. Oxybutynin 5 mg 1 tablet 3 times a day.  Montelukast 10 mg 1 tablet daily (bedtime) Lisinopril 40 mg take 1 tablet twice daily (breakfast and bedtime) Glimepiride 4 mg 1 tablet by mouth daily Amitriptyline 25 mg 1 tablet daily (bedtime)  Patient declined the following medications: Rosuvastatin 10 mg due to getting 90 ds on 02/25/20 Furosemide 20 mg due to getting 90 ds on 17/71/16 Trulicity 3 FB/9.0 ml due to her getting it from the manufacture.  Patient does not need any refills.  Confirmed delivery date of 03/09/20 , advised patient that pharmacy will contact them the morning of delivery.  Follow-Up:  Coordination of Enhanced Pharmacy Services   [place] B. Owens Shark, [place] notified  Margaretmary Dys, [place] Pharmacy Assistant [phone]

## 2020-03-04 ENCOUNTER — Telehealth: Payer: Self-pay

## 2020-03-04 ENCOUNTER — Other Ambulatory Visit: Payer: Self-pay

## 2020-03-04 MED ORDER — TRESIBA FLEXTOUCH 100 UNIT/ML ~~LOC~~ SOPN: 75.0000 [IU] | PEN_INJECTOR | Freq: Every day | SUBCUTANEOUS | 1 refills | Status: DC

## 2020-03-04 NOTE — Telephone Encounter (Signed)
Christy Crawford called to report that her Mother's blood sugar's have been dropping in the 40-50 range in the morning.  Dr. Tobie Poet advised that she stop the glimeperide and decrease the Tresiba to 75 units daily.  She was instructed to continue monitoring her blood sugars and call us back if her readings remain low.

## 2020-03-06 DIAGNOSIS — R279 Unspecified lack of coordination: Secondary | ICD-10-CM | POA: Diagnosis not present

## 2020-03-06 DIAGNOSIS — Z743 Need for continuous supervision: Secondary | ICD-10-CM | POA: Diagnosis not present

## 2020-03-06 DIAGNOSIS — R5381 Other malaise: Secondary | ICD-10-CM | POA: Diagnosis not present

## 2020-03-16 ENCOUNTER — Encounter: Payer: Self-pay | Admitting: Family Medicine

## 2020-03-21 ENCOUNTER — Other Ambulatory Visit: Payer: Self-pay | Admitting: Family Medicine

## 2020-04-01 ENCOUNTER — Telehealth: Payer: Self-pay

## 2020-04-01 NOTE — Chronic Care Management (AMB) (Signed)
Chronic Care Management Pharmacy Assistant   Name: Christy Crawford  MRN: [id number] DOB: 1955/07/28  Reason for Encounter: Medication Review  Patient Questions:   1.  Have you seen any other providers since your last visit? No    2.  Any changes in your medicines or health? Yes, 03/04/20 Dr. Sedalia Muta discontinued glimepiride and decreased Tresiba to 75 units daily due to BGL in 40-50 range     PCP : Blane Ohara, MD  Allergies:  Not on File  Medications: Outpatient Encounter Medications as of 04/01/2020  Medication Sig  . acetaminophen (TYLENOL) 325 MG tablet Take 325 mg by mouth every 6 (six) hours as needed. (Patient not taking: Reported on 11/14/2019)  . ALPRAZolam (XANAX) 1 MG tablet Take 1 mg by mouth in the morning, at noon, in the evening, and at bedtime.   Marland Kitchen amitriptyline (ELAVIL) 25 MG tablet Take 1 tablet (25 mg total) by mouth at bedtime.  Marland Kitchen aspirin-acetaminophen-caffeine (EXCEDRIN MIGRAINE) 250-250-65 MG tablet Take 2 tablets by mouth in the morning.  [place] Choice Lancets 28G MISC E11.4 Use new lancet each time when checking blood sugar  . COMFORT EZ PEN NEEDLES 31G X 5 MM MISC E11.44 use new needle with each new injection  . Dulaglutide (TRULICITY) 3 MG/0.5ML SOPN Inject 0.5 mLs (3 mg total) as directed once a week.  . furosemide (LASIX) 20 MG tablet TAKE ONE TABLET BY MOUTH ONCE DAILY  . glucose blood (CLEVER CHOICE MICRO TEST) test strip E11.44 Use new strip each time when checking blood sugar. Check sugar 1-3 times daily.  . insulin degludec (TRESIBA FLEXTOUCH) 100 UNIT/ML FlexTouch Pen Inject 75 Units into the skin daily.  Marland Kitchen lisinopril (ZESTRIL) 40 MG tablet TAKE 1 TABLET BY MOUTH EVERY MORNING AND1 TABLET BY MOUTH AT BEDTIME  . montelukast (SINGULAIR) 10 MG tablet TAKE ONE TABLET BY MOUTH EVERYDAY AT BEDTIME  . Multiple Vitamins-Minerals (CENTRUM SILVER PO) Take 1 tablet by mouth daily.  Marland Kitchen oxybutynin (DITROPAN) 5 MG tablet Take 1 tablet (5 mg total) by mouth 3  (three) times daily.  . pregabalin (LYRICA) 300 MG capsule Take 1 capsule (300 mg total) by mouth every evening.  . rosuvastatin (CRESTOR) 10 MG tablet TAKE ONE TABLET BY MOUTH ONCE DAILY. Due FOR FOLLOWING UP office visits AND labs. No refills UNTIL seen  . sertraline (ZOLOFT) 100 MG tablet Take 1 tablet (100 mg total) by mouth 2 (two) times daily.  . Vitamin D, Cholecalciferol, 25 MCG (1000 UT) CAPS Take by mouth.   No facility-administered encounter medications on file as of 04/01/2020.    Current Diagnosis: Patient Active Problem List   Diagnosis Date Noted  . Pedal edema 09/23/2019  . Fibromyalgia 07/11/2019  . Class 3 severe obesity due to excess calories with serious comorbidity and body mass index (BMI) of 40.0 to 44.9 in adult ([facility]) 07/11/2019  . Chronic right-sided low back pain without sciatica 06/21/2019  . 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 Chronic pain of both shoulders 06/21/2019  . Chronic pain of right knee 06/21/2019  . Dyslipidemia associated with type 2 diabetes mellitus ([facility]) 06/13/2019  . Mixed hyperlipidemia 06/13/2019   Reviewed chart for medication changes ahead of medication coordination call.  No OVs, Consults, or hospital visits since last care coordination call/Pharmacist visit.  03/04/20 Dr. Sedalia Muta discontinued glimepiride and decreased Tresiba to 75 units daily due to BGL in 40-50 range  BP Readings from Last 3 Encounters:  01/01/20 Marland Kitchen)  142/78  09/19/19 (!) 142/76  07/11/19 (!) 174/88    Lab Results  Component Value Date   HGBA1C 7.0 (H) 01/01/2020   04/01/20 [facility]   04/03/20 [facility]  Unable to reach patient to verify medications.  Patient obtains medications through Adherence Packaging  30 Days   Last adherence delivery included: Alprazolam 1 mg 1 tablet 4 times daily as needed Pregabalin 300 mg 1 capsule daily (breakfast)  Patient declined the following medications last month: Rosuvastatin 10 mg  due to getting 90 ds on 02/25/20 Furosemide 20 mg due to getting 90 ds on 123456 Trulicity 3 [id number] ml due to her getting it from the manufacturer.    Patient is due for next adherence delivery on: 04/08/2020.  Called patient and reviewed medications and coordinated delivery.  This delivery to include: Montelukast 10 mg 1 tablet daily at bedtime Lisinopril 40 mg take 1 tablet twice daily (breakfast and bedtime) Sertraline 100 mg 1 tablet twice daily (breakfast and bedtime) Amitriptyline 25 mg 1 tablet daily (bedtime) Oxybutynin 5 mg 1 tablet 3 times a day. (breakfast, lunch, bedtime) Glimepiride 4 mg 1 tablet by mouth daily (breakfast) Alprazolam 1 mg take 1 tablet 4 times a day PRN Pregabalin 300mg  take one capsule daily (breakfast)  Patient declined the following medications: Rosuvastatin 10 mg 1 tablet at breakfast- 90 DS filled 02/25/20 Furosemide 20 mg 1 tablet at breakfast- 90 DS filled 123456 Trulicity 3mg /0.5 mL pen- Inject 0.51mLs once a week. - Gets from manufacturer.   Patient needs refills for no medications  Delivery date of 04/08/2020, advised patient that pharmacy will contact them the morning of delivery.   Follow-Up:  Pharmacist Review   Elly Modena. Owens Shark, [place] notified  Margaretmary Dys, [place] Pharmacy Assistant [phone]

## 2020-04-13 NOTE — Progress Notes (Signed)
Cancelled.  

## 2020-04-14 ENCOUNTER — Ambulatory Visit (INDEPENDENT_AMBULATORY_CARE_PROVIDER_SITE_OTHER): Payer: Medicare HMO | Admitting: Family Medicine

## 2020-04-14 DIAGNOSIS — I10 Essential (primary) hypertension: Secondary | ICD-10-CM

## 2020-04-14 DIAGNOSIS — M797 Fibromyalgia: Secondary | ICD-10-CM

## 2020-04-14 DIAGNOSIS — E782 Mixed hyperlipidemia: Secondary | ICD-10-CM

## 2020-04-14 DIAGNOSIS — I739 Peripheral vascular disease, unspecified: Secondary | ICD-10-CM

## 2020-04-14 DIAGNOSIS — E1144 Type 2 diabetes mellitus with diabetic amyotrophy: Secondary | ICD-10-CM

## 2020-04-15 ENCOUNTER — Telehealth: Payer: Medicare Other

## 2020-04-15 ENCOUNTER — Telehealth: Payer: Self-pay

## 2020-04-15 NOTE — Chronic Care Management (AMB) (Deleted)
Chronic [facility]  Name: Christy Crawford  MRN: [id number] DOB: 01/19/1956  Chief Complaint/ HPI  Christy Crawford,  65 y.o. , female presents for their Follow-Up CCM visit with the clinical pharmacist via telephone due to COVID-19 Pandemic.  PCP : Christy Brome, MD  Their chronic conditions include: Type 2 DM, Dyslipidemia, Chronic pain of soulders/knee, fibromyalgia, edema.  Office Visits: 04/14/2020 - cancelled visit with Christy Crawford.  Consult Visit: None reported  Medications: Outpatient Encounter Medications as of 04/15/2020  Medication Sig  . acetaminophen (TYLENOL) 325 MG tablet Take 325 mg by mouth every 6 (six) hours as needed. (Patient not taking: Reported on 11/14/2019)  . ALPRAZolam (XANAX) 1 MG tablet Take 1 mg by mouth in the morning, at noon, in the evening, and at bedtime.   Marland Kitchen amitriptyline (ELAVIL) 25 MG tablet Take 1 tablet (25 mg total) by mouth at bedtime.  Marland Kitchen aspirin-acetaminophen-caffeine (EXCEDRIN MIGRAINE) 250-250-65 MG tablet Take 2 tablets by mouth in the morning.  Christy Crawford Choice Lancets 28G MISC E11.4 Use new lancet each time when checking blood sugar  . COMFORT EZ PEN NEEDLES 31G X 5 MM MISC E11.44 use new needle with each new injection  . Dulaglutide (TRULICITY) 3 TK/3.5WS SOPN Inject 0.5 mLs (3 mg total) as directed once a week.  . furosemide (LASIX) 20 MG tablet TAKE ONE TABLET BY MOUTH ONCE DAILY  . glucose blood (CLEVER CHOICE MICRO TEST) test strip E11.44 Use new strip each time when checking blood sugar. Check sugar 1-3 times daily.  . insulin degludec (TRESIBA FLEXTOUCH) 100 UNIT/ML FlexTouch Pen Inject 75 Units into the skin daily.  Marland Kitchen lisinopril (ZESTRIL) 40 MG tablet TAKE 1 TABLET BY MOUTH EVERY MORNING AND1 TABLET BY MOUTH AT BEDTIME  . montelukast (SINGULAIR) 10 MG tablet TAKE ONE TABLET BY MOUTH EVERYDAY AT BEDTIME  . Multiple Vitamins-Minerals (CENTRUM SILVER PO) Take 1 tablet by mouth daily.  Marland Kitchen oxybutynin (DITROPAN) 5 MG tablet Take 1  tablet (5 mg total) by mouth 3 (three) times daily.  . pregabalin (LYRICA) 300 MG capsule Take 1 capsule (300 mg total) by mouth every evening.  . rosuvastatin (CRESTOR) 10 MG tablet TAKE ONE TABLET BY MOUTH ONCE DAILY. Due FOR FOLLOWING UP office visits AND labs. No refills UNTIL seen  . sertraline (ZOLOFT) 100 MG tablet Take 1 tablet (100 mg total) by mouth 2 (two) times daily.  . Vitamin D, Cholecalciferol, 25 MCG (1000 UT) CAPS Take by mouth.   No facility-administered encounter medications on file as of 04/15/2020.     Current Diagnosis/Assessment:  Goals Addressed   None    Hyperlipidemia   LDL goal < 70  Lipid Panel     Component Value Date/Time   CHOL 145 01/01/2020 1604   TRIG 103 01/01/2020 1604   HDL 62 01/01/2020 1604   LDLCALC 64 01/01/2020 1604    Hepatic Function Latest Ref Rng & Units 01/01/2020 09/19/2019 06/13/2019  Total Protein 6.0 - 8.5 g/dL 6.8 7.0 6.3  Albumin 3.8 - 4.8 g/dL 4.5 4.7 4.2  AST 0 - 40 IU/L 34 43(H) 32  ALT 0 - 32 IU/L $Remov'26 31 23  'cRuIrY$ Alk Phosphatase 44 - 121 IU/L 124(H) 118 102  Total Bilirubin 0.0 - 1.2 mg/dL 0.4 0.3 0.3     The 10-year ASCVD risk score Christy Crawford DC Jr., et al., 2013) is: 13.7%   Values used to calculate the score:     Age: 35 years     Sex: Female  Is Non-Hispanic African American: No     Diabetic: Yes     Tobacco smoker: No     Systolic Blood Pressure: 341 mmHg     Is BP treated: Yes     HDL Cholesterol: 62 mg/dL     Total Cholesterol: 145 mg/dL   Patient has failed these meds in past: none reported Patient is currently uncontrolled on the following medications:  . Rosuvastatin 10 mg daily We discussed:  diet and exercise extensively. Consider increase of rosuvastatin to 20 mg daily if next LDL above goal.   Plan  Continue current medications Hypertension   BP today is:  <130/80  Office blood pressures are  BP Readings from Last 3 Encounters:  01/01/20 (!) 142/78  09/19/19 (!) 142/76  07/11/19 (!) 174/88     Patient has failed these meds in the past: none reported Patient is currently controlled on the following medications: furosemide 20 mg daily, lisinopril 40 mg bid Patient checks BP at home daily  Patient home BP readings are ranging: 130/75 mmHg  We discussed diet and exercise extensively. Patient still notes some leg swelling but wearing compression stockings and using unna boots as needed. No longer leg weeping with current regimen.   Plan  Continue current medications      Diabetes   Recent Relevant Labs: Lab Results  Component Value Date/Time   HGBA1C 7.0 (H) 01/01/2020 04:04 PM   HGBA1C 7.3 (H) 09/19/2019 03:53 PM   HGBA1C 7.5 02/12/2019 12:00 AM     Checking BG: Daily  Recent FBG Readings:100 mg/dL  Patient has failed these meds in past: metformin Patient is currently uncontrolled on the following medications: glimepiride 4 mg daily, tresiba 80 units qhs, trulicity 1.5 mg weekly, pen needles  Last diabetic Foot exam:  Lab Results  Component Value Date/Time   HMDIABEYEEXA No Retinopathy 05/03/2019 12:00 AM    Last diabetic Eye exam: No results found for: HMDIABFOOTEX   We discussed: diet and exercise extensively. Patient currently has Trulicity through patient assistance. Discussed Tyler Aas and pen needles through patient assistance. Pharmacist will coordinate paperwork with patient and provider. Pharmacist provided sample of Tresiba in office today. Patient is hopeful next blood work will be improved. Will consider increased dose of Trulicity to 3 mg weekly and potential discontinue of glimepiride.   Plan  Continue current medications and  Other Diagnosis:Depression Managed by Christy Crawford    Patient has failed these meds in past: none reported Patient is currently controlled on the following medications: alprazolam 1 mg qid prn, amitriptyline 24 mg qhs, sertraline 100 mg bid We discussed:  Patient reports that her daughter passed away 10 years ago. It is still  difficult for patient and has some episodes of tears. She reports overall well controlled. Discussed fall risk with patient using amitriptyline, alprazolam and benadryl. Patient is willing to work to trial reducing benadryl to 25 mg daily for 1 week and then stop if tolerated.   Plan  Continue current medications  Pain   Patient has failed these meds in past: none reported Patient is currently controlled on the following medications:  . Acetaminophen/Caffeine/Aspirin OTC 2 tablets daily   . Lyrica 300 mg qpm  We discussed:  Patient has tingling/neuropathy in legs. Overall improved with treatment. Patient was recently increased to Lyrica 300 mg bid. She did not tolerate due to daytime somnolence. Patient has reduced to 300 mg qpm and tolerating well.   Plan  Continue current medications Osteopenia / Osteoporosis   Last  DEXA Scan: not available  No results found for: VD25OH   Patient has failed these meds in past: none reported Patient is currently uncontrolled on the following medications:  Marland Kitchen Vitamin D 25 mcg (1000 unit) daily  We discussed:  Recommend [phone] units of vitamin D daily. Recommend 1200 mg of calcium daily from dietary and supplemental sources. Recommend weight-bearing and muscle strengthening exercises for building and maintaining bone density. Patient does not remember last Dexa scan date or mammogram. Last mammogram on file is 03/2018. Would like to set in [place] if possible.   Plan  Continue current medications  Health Maintenance   Patient is currently controlled on the following medications:  . Oxybutynin 5 mg tid - bladder . Benadryl 50 mg qpm - sleep/nasal congestion/skin itching  We discussed:  Patient reports that she uses the benadryl for allergies/congestion as well as itchy skin. Pharmacist counseled on the risks associated with diphenhydramine, pregabalin, amitriptyline and alprazolam. Patient is going to work on reducing/discontinuing  diphenhydramine. Patient is interested in something to help her allergies/nasal congestion if possible. Patient requested montelukast if Christy Crawford approves.   Plan  Consider beginning Montelukast 10 mg daily for allergy symptoms.  Vaccines   Reviewed and discussed patient's vaccination history.  Patient is not interested in [place] shot at this time. Her family has all been vaccinated at this point. She doesn't see benefit since vaccine not developed for current strains of COVID.   Immunization History  Administered Date(s) Administered  . Fluad Quad(high Dose 65+) 01/01/2020  . Influenza-Unspecified 08/25/2017, 02/20/2018, 02/12/2019  . Pneumococcal Polysaccharide-23 01/01/2020    Plan  Recommended patient discuss COVID vaccine at next office visit.  Medication Management   Pt uses [facility] for all medications Uses pill box? Yes Pt endorses good compliance  We discussed: Patient interested in pharmacy delivery and adherence packaging.   Verbal consent obtained for [facility] enhanced pharmacy services (medication synchronization, adherence packaging, delivery coordination). A medication sync plan was created to allow patient to get all medications delivered once every 30 to 90 days per patient preference. Patient understands they have freedom to choose pharmacy and clinical pharmacist will coordinate Crawford between all prescribers and [facility].    Plan  Utilize [facility] for medication synchronization, packaging and delivery    Follow up: 1 month phone visit

## 2020-04-21 NOTE — Progress Notes (Signed)
° ° °  Chronic Care Management Pharmacy Assistant   Name: Christy Crawford  MRN: [id number] DOB: 1956-01-11  Reason for Encounter: Medication coordination for delivery  Multiple attempts have been made by myself and Donette Larry, CPP to try and contact the patient and/or her daughter to coordinate a medication delivery.  Several messages have been left at the patients home number and Donette Larry, CPP has text the patients daughter multiple time with no response back.  PCP : [name], MD  Allergies:  Not on File  Medications: Outpatient Encounter Medications as of 04/15/2020  Medication Sig   acetaminophen (TYLENOL) 325 MG tablet Take 325 mg by mouth every 6 (six) hours as needed. (Patient not taking: Reported on 11/14/2019)   ALPRAZolam (XANAX) 1 MG tablet Take 1 mg by mouth in the morning, at noon, in the evening, and at bedtime.    amitriptyline (ELAVIL) 25 MG tablet Take 1 tablet (25 mg total) by mouth at bedtime.   aspirin-acetaminophen-caffeine (EXCEDRIN MIGRAINE) 250-250-65 MG tablet Take 2 tablets by mouth in the morning.   Clever Choice Lancets 28G MISC E11.4 Use new lancet each time when checking blood sugar   COMFORT EZ PEN NEEDLES 31G X 5 MM MISC E11.44 use new needle with each new injection   Dulaglutide (TRULICITY) 3 QB/3.4LP SOPN Inject 0.5 mLs (3 mg total) as directed once a week.   furosemide (LASIX) 20 MG tablet TAKE ONE TABLET BY MOUTH ONCE DAILY   glucose blood (CLEVER CHOICE MICRO TEST) test strip E11.44 Use new strip each time when checking blood sugar. Check sugar 1-3 times daily.   insulin degludec (TRESIBA FLEXTOUCH) 100 UNIT/ML FlexTouch Pen Inject 75 Units into the skin daily.   lisinopril (ZESTRIL) 40 MG tablet TAKE 1 TABLET BY MOUTH EVERY MORNING AND1 TABLET BY MOUTH AT BEDTIME   montelukast (SINGULAIR) 10 MG tablet TAKE ONE TABLET BY MOUTH EVERYDAY AT BEDTIME   Multiple Vitamins-Minerals (CENTRUM SILVER PO) Take 1 tablet by mouth daily.   oxybutynin  (DITROPAN) 5 MG tablet Take 1 tablet (5 mg total) by mouth 3 (three) times daily.   pregabalin (LYRICA) 300 MG capsule Take 1 capsule (300 mg total) by mouth every evening.   rosuvastatin (CRESTOR) 10 MG tablet TAKE ONE TABLET BY MOUTH ONCE DAILY. Due FOR FOLLOWING UP office visits AND labs. No refills UNTIL seen   sertraline (ZOLOFT) 100 MG tablet Take 1 tablet (100 mg total) by mouth 2 (two) times daily.   Vitamin D, Cholecalciferol, 25 MCG (1000 UT) CAPS Take by mouth.   No facility-administered encounter medications on file as of 04/15/2020.    Current Diagnosis: Patient Active Problem List   Diagnosis Date Noted   Pedal edema 09/23/2019   Fibromyalgia 07/11/2019   Class 3 severe obesity due to excess calories with serious comorbidity and body mass index (BMI) of 40.0 to 44.9 in adult [facility]) 07/11/2019   Chronic right-sided low back pain without sciatica 06/21/2019   Diabetic amyotrophy associated with type 2 diabetes mellitus ([place]) 06/21/2019   Chronic pain of both shoulders 06/21/2019   Chronic pain of right knee 06/21/2019   Dyslipidemia associated with type 2 diabetes mellitus ([place]) 06/13/2019   Mixed hyperlipidemia 06/13/2019      Follow-Up:  Pharmacist Review  Donette Larry, CPP notified  Clarita Leber, [place] Pharmacist Assistant [phone]

## 2020-04-24 ENCOUNTER — Telehealth: Payer: Medicare HMO

## 2020-04-24 ENCOUNTER — Telehealth: Payer: Self-pay

## 2020-04-24 NOTE — Progress Notes (Signed)
Chronic Care Management Pharmacy Assistant   Name: Christy Crawford  MRN: [id number] DOB: 05/23/1955  Reason for Encounter: PAP form and medication coordination    PCP : Christy Brome, MD  Allergies:  Not on File  Medications: Outpatient Encounter Medications as of 04/24/2020  Medication Sig  . acetaminophen (TYLENOL) 325 MG tablet Take 325 mg by mouth every 6 (six) hours as needed. (Patient not taking: Reported on 11/14/2019)  . ALPRAZolam (XANAX) 1 MG tablet Take 1 mg by mouth in the morning, at noon, in the evening, and at bedtime.   Marland Kitchen amitriptyline (ELAVIL) 25 MG tablet Take 1 tablet (25 mg total) by mouth at bedtime.  Marland Kitchen aspirin-acetaminophen-caffeine (EXCEDRIN MIGRAINE) 250-250-65 MG tablet Take 2 tablets by mouth in the morning.  [name] Choice Lancets 28G MISC E11.4 Use new lancet each time when checking blood sugar  . COMFORT EZ PEN NEEDLES 31G X 5 MM MISC E11.44 use new needle with each new injection  . Dulaglutide (TRULICITY) 3 PJ/0.9TO SOPN Inject 0.5 mLs (3 mg total) as directed once a week.  . furosemide (LASIX) 20 MG tablet TAKE ONE TABLET BY MOUTH ONCE DAILY  . glucose blood (CLEVER CHOICE MICRO TEST) test strip E11.44 Use new strip each time when checking blood sugar. Check sugar 1-3 times daily.  . insulin degludec (TRESIBA FLEXTOUCH) 100 UNIT/ML FlexTouch Pen Inject 75 Units into the skin daily.  Marland Kitchen lisinopril (ZESTRIL) 40 MG tablet TAKE 1 TABLET BY MOUTH EVERY MORNING AND1 TABLET BY MOUTH AT BEDTIME  . montelukast (SINGULAIR) 10 MG tablet TAKE ONE TABLET BY MOUTH EVERYDAY AT BEDTIME  . Multiple Vitamins-Minerals (CENTRUM SILVER PO) Take 1 tablet by mouth daily.  Marland Kitchen oxybutynin (DITROPAN) 5 MG tablet Take 1 tablet (5 mg total) by mouth 3 (three) times daily.  . pregabalin (LYRICA) 300 MG capsule Take 1 capsule (300 mg total) by mouth every evening.  . rosuvastatin (CRESTOR) 10 MG tablet TAKE ONE TABLET BY MOUTH ONCE DAILY. Due FOR FOLLOWING UP office visits AND labs. No  refills UNTIL seen  . sertraline (ZOLOFT) 100 MG tablet Take 1 tablet (100 mg total) by mouth 2 (two) times daily.  . Vitamin D, Cholecalciferol, 25 MCG (1000 UT) CAPS Take by mouth.   No facility-administered encounter medications on file as of 04/24/2020.    Current Diagnosis: Patient Active Problem List   Diagnosis Date Noted  . Pedal edema 09/23/2019  . Fibromyalgia 07/11/2019  . Class 3 severe obesity due to excess calories with serious comorbidity and body mass index (BMI) of 40.0 to 44.9 in adult ([place]) 07/11/2019  . Chronic right-sided low back pain without sciatica 06/21/2019  . Diabetic amyotrophy associated with type 2 diabetes mellitus ([place]) 06/21/2019  . Chronic pain of both shoulders 06/21/2019  . Chronic pain of right knee 06/21/2019  . Dyslipidemia associated with type 2 diabetes mellitus ([place]) 06/13/2019  . Mixed hyperlipidemia 06/13/2019   Patients daughter Christy Crawford called to see if Christy Crawford, CPP had received the PAP form that was dropped off yesterday.  Christy Crawford did get the form and will get it signed and faxed out today.  I asked Christy Crawford if any medication were needed as we have made multiple attempts to see if the patient is in need of anything.  Christy Crawford stated she would be speaking with her dad and would let us know, but to her knowledge nothing was needed at this time.   Follow-Up:  Pharmacist Review  Christy Crawford, CPP notified  Christy Crawford  Christy Crawford, [place] Pharmacist Assistant [phone]

## 2020-04-30 ENCOUNTER — Other Ambulatory Visit: Payer: Self-pay

## 2020-04-30 ENCOUNTER — Telehealth: Payer: Self-pay

## 2020-04-30 MED ORDER — AMITRIPTYLINE HCL 25 MG PO TABS: 25.0000 mg | ORAL_TABLET | Freq: Every day | ORAL | 1 refills | Status: DC

## 2020-04-30 NOTE — Progress Notes (Signed)
    Chronic Care Management Pharmacy Assistant   Name: Christy Crawford  MRN: [id number] DOB: 1956/04/05  Reason for Encounter: Medication Review for acute medication delivery of Montelukast, Lisinopril and Amitriptyline.  I prepared and acute form for the patient for delivery on 04/30/20.  I forwarded the form to Donette Larry, CPP for approval and refill for Amitriptyline.    I have let the pharmacy know the request is in process.    PCP : [name], MD  Allergies:  Not on File  Medications: Outpatient Encounter Medications as of 04/30/2020  Medication Sig  . acetaminophen (TYLENOL) 325 MG tablet Take 325 mg by mouth every 6 (six) hours as needed. (Patient not taking: Reported on 11/14/2019)  . ALPRAZolam (XANAX) 1 MG tablet Take 1 mg by mouth in the morning, at noon, in the evening, and at bedtime.   Marland Kitchen amitriptyline (ELAVIL) 25 MG tablet Take 1 tablet (25 mg total) by mouth at bedtime.  Marland Kitchen aspirin-acetaminophen-caffeine (EXCEDRIN MIGRAINE) 250-250-65 MG tablet Take 2 tablets by mouth in the morning.  [name] Choice Lancets 28G MISC E11.4 Use new lancet each time when checking blood sugar  . COMFORT EZ PEN NEEDLES 31G X 5 MM MISC E11.44 use new needle with each new injection  . Dulaglutide (TRULICITY) 3 FX/9.0WI SOPN Inject 0.5 mLs (3 mg total) as directed once a week.  . furosemide (LASIX) 20 MG tablet TAKE ONE TABLET BY MOUTH ONCE DAILY  . glucose blood (CLEVER CHOICE MICRO TEST) test strip E11.44 Use new strip each time when checking blood sugar. Check sugar 1-3 times daily.  . insulin degludec (TRESIBA FLEXTOUCH) 100 UNIT/ML FlexTouch Pen Inject 75 Units into the skin daily.  Marland Kitchen lisinopril (ZESTRIL) 40 MG tablet TAKE 1 TABLET BY MOUTH EVERY MORNING AND1 TABLET BY MOUTH AT BEDTIME  . montelukast (SINGULAIR) 10 MG tablet TAKE ONE TABLET BY MOUTH EVERYDAY AT BEDTIME  . Multiple Vitamins-Minerals (CENTRUM SILVER PO) Take 1 tablet by mouth daily.  Marland Kitchen oxybutynin (DITROPAN) 5 MG tablet Take 1  tablet (5 mg total) by mouth 3 (three) times daily.  . pregabalin (LYRICA) 300 MG capsule Take 1 capsule (300 mg total) by mouth every evening.  . rosuvastatin (CRESTOR) 10 MG tablet TAKE ONE TABLET BY MOUTH ONCE DAILY. Due FOR FOLLOWING UP office visits AND labs. No refills UNTIL seen  . sertraline (ZOLOFT) 100 MG tablet Take 1 tablet (100 mg total) by mouth 2 (two) times daily.  . Vitamin D, Cholecalciferol, 25 MCG (1000 UT) CAPS Take by mouth.   No facility-administered encounter medications on file as of 04/30/2020.    Current Diagnosis: Patient Active Problem List   Diagnosis Date Noted  . Pedal edema 09/23/2019  . Fibromyalgia 07/11/2019  . Class 3 severe obesity due to excess calories with serious comorbidity and body mass index (BMI) of 40.0 to 44.9 in adult ([place]) 07/11/2019  . Chronic right-sided low back pain without sciatica 06/21/2019  . Diabetic amyotrophy associated with type 2 diabetes mellitus ([place]) 06/21/2019  . Chronic pain of both shoulders 06/21/2019  . Chronic pain of right knee 06/21/2019  . Dyslipidemia associated with type 2 diabetes mellitus ([place]) 06/13/2019  . Mixed hyperlipidemia 06/13/2019   Follow-Up:  Coordination of Enhanced Pharmacy Services and Pharmacist Review  Donette Larry, CPP notified  Clarita Leber, [place] Pharmacist Assistant [phone]

## 2020-05-07 DIAGNOSIS — E113313 Type 2 diabetes mellitus with moderate nonproliferative diabetic retinopathy with macular edema, bilateral: Secondary | ICD-10-CM | POA: Diagnosis not present

## 2020-05-07 DIAGNOSIS — Z961 Presence of intraocular lens: Secondary | ICD-10-CM | POA: Diagnosis not present

## 2020-05-07 DIAGNOSIS — H16103 Unspecified superficial keratitis, bilateral: Secondary | ICD-10-CM | POA: Diagnosis not present

## 2020-05-07 DIAGNOSIS — H26493 Other secondary cataract, bilateral: Secondary | ICD-10-CM | POA: Diagnosis not present

## 2020-05-07 DIAGNOSIS — Z794 Long term (current) use of insulin: Secondary | ICD-10-CM | POA: Diagnosis not present

## 2020-05-07 DIAGNOSIS — H524 Presbyopia: Secondary | ICD-10-CM | POA: Diagnosis not present

## 2020-05-07 LAB — HM DIABETES EYE EXAM

## 2020-05-09 NOTE — Progress Notes (Signed)
Subjective:  Patient ID: Christy Crawford, female    DOB: 1955/08/14  Age: 65 y.o. MRN: [id number]  Chief Complaint  Patient presents with  . Diabetes  . Hypertension  . Hyperlipidemia   HPI:  65 yo female with history of diabetic amyotrophy, who presented following hospitalization and subsequent stay at [place] for rehab. Patient had 3 falls the week prior to admission in October 2021. Patient had a left fibular fracture. MRI of lumbar spine was done showing mild to moderate disease but nothing acute. Neurology recommended a metabolic work-up including X73, Z3G, folic acid levels. Neurology recommended lumbar puncture for CSF cell count, glucose, protein, IgG index and cytology. 2 attempts to get the lumbar puncture were unsuccessful. It was recommended she follow-up with neurology as an outpatient and obtain an EMG/NCS.s admitted to [facility].  Anxiety-Xanax 1 mg one four times a day. Was discontinued abruptly when the patient was admitted from 01/23/2020 to 01/30/2020 and then went to [facility] for [facility]. She had severe itching and was scratching all over her body. She has several abrasions from her scratching still that are not healed. Her psychiatrist, Dr. Marquis Lunch, prescribes her Xanax. HTN-currently on lisinopril 40 mg 1 twice daily, DM amyotrophy- Sugars running 80-120. Today had a terrible reading 254 this morning. Trulicity 3 mg weekly, Tresiba 65 U once daily. Checks feet twice daily. On Lyrica 300 mg nightly and amitriptyline 25 mg once at night Hyperlipidemia: Crestor 10 mg once daily at night.  Depression-Zoloft 100 mg once daily. Current Outpatient Medications on File Prior to Visit  Medication Sig Dispense Refill  . acetaminophen (TYLENOL) 325 MG tablet Take 325 mg by mouth every 6 (six) hours as needed.    . ALPRAZolam (XANAX) 1 MG tablet Take 1 mg by mouth in the morning, at noon, in the evening, and at bedtime.     Marland Kitchen amitriptyline (ELAVIL) 25 MG tablet  Take 1 tablet (25 mg total) by mouth at bedtime. 90 tablet 1  . aspirin-acetaminophen-caffeine (EXCEDRIN MIGRAINE) 250-250-65 MG tablet Take 2 tablets by mouth in the morning.    [name] Choice Lancets 28G MISC E11.4 Use new lancet each time when checking blood sugar 100 each 2  . COMFORT EZ PEN NEEDLES 31G X 5 MM MISC E11.44 use new needle with each new injection 100 each 2  . Dulaglutide (TRULICITY) 3 DJ/2.4QA SOPN Inject 0.5 mLs (3 mg total) as directed once a week. 3 mL 2  . furosemide (LASIX) 20 MG tablet TAKE ONE TABLET BY MOUTH ONCE DAILY 90 tablet 0  . glucose blood (CLEVER CHOICE MICRO TEST) test strip E11.44 Use new strip each time when checking blood sugar. Check sugar 1-3 times daily. 100 each 2  . lisinopril (ZESTRIL) 40 MG tablet TAKE 1 TABLET BY MOUTH EVERY MORNING AND1 TABLET BY MOUTH AT BEDTIME 180 tablet 1  . montelukast (SINGULAIR) 10 MG tablet TAKE ONE TABLET BY MOUTH EVERYDAY AT BEDTIME 90 tablet 3  . Multiple Vitamins-Minerals (CENTRUM SILVER PO) Take 1 tablet by mouth daily.    Marland Kitchen oxybutynin (DITROPAN) 5 MG tablet Take 1 tablet (5 mg total) by mouth 3 (three) times daily. 270 tablet 1  . pregabalin (LYRICA) 300 MG capsule Take 1 capsule (300 mg total) by mouth every evening. 180 capsule 1  . rosuvastatin (CRESTOR) 10 MG tablet TAKE ONE TABLET BY MOUTH ONCE DAILY. Due FOR FOLLOWING UP office visits AND labs. No refills UNTIL seen 90 tablet 1  .  sertraline (ZOLOFT) 100 MG tablet Take 1 tablet (100 mg total) by mouth 2 (two) times daily. 180 tablet 1  . Vitamin D, Cholecalciferol, 25 MCG (1000 UT) CAPS Take by mouth.     No current facility-administered medications on file prior to visit.   Past Medical History:  Diagnosis Date  . Chronic ulcer of right calf ([place])   . Diabetes mellitus without complication ([place])   . 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 Essential hypertension   . Fibromyalgia   .  Generalized edema   . Hyperlipidemia   . Hypertension   . Major depression   . Migraine headache   . Mixed hyperlipidemia   . Sleep apnea   . Telogen effluvium    Past Surgical History:  Procedure Laterality Date  . CHOLECYSTECTOMY    . lens implants Bilateral     Family History  Adopted: Yes  Problem Relation Age of Onset  . Diabetes Maternal Aunt    Social History   Socioeconomic History  . Marital status: Married    Spouse name: Not on file  . Number of children: 1  . Years of education: Not on file  . Highest education level: Not on file  Occupational History  . Occupation: disabled  Tobacco Use  . Smoking status: Former Smoker    Types: Cigarettes    Quit date: 2000    Years since quitting: 22.1  . Smokeless tobacco: Never Used  Substance and Sexual Activity  . Alcohol use: Yes    Alcohol/week: 1.0 standard drink    Types: 1 Glasses of wine per week    Comment: socially  . Drug use: Never  . Sexual activity: Not on file  Other Topics Concern  . Not on file  Social History Narrative  . Not on file   Social Determinants of Health   Financial Resource Strain: Not on file  Food Insecurity: No Food Insecurity  . Worried About Charity fundraiser in the Last Year: Never true  . Ran Out of Food in the Last Year: Never true  Transportation Needs: No Transportation Needs  . Lack of Transportation (Medical): No  . Lack of Transportation (Non-Medical): No  Physical Activity: Not on file  Stress: Not on file  Social Connections: Not on file    Review of Systems  Constitutional: Positive for fatigue. Negative for chills and fever.  HENT: Negative for congestion, ear pain, rhinorrhea and sore throat.   Respiratory: Negative for cough and shortness of breath.   Cardiovascular: Negative for chest pain and palpitations.  Gastrointestinal: Positive for constipation (relieved by stool softeners as needed. ). Negative for abdominal pain, diarrhea, nausea and  vomiting.  Genitourinary: Negative for dysuria and urgency.  Musculoskeletal: Positive for arthralgias, back pain and myalgias.  Neurological: Negative for dizziness, weakness, light-headedness and headaches.  Psychiatric/Behavioral: Negative for dysphoric mood. The patient is not nervous/anxious.      Objective:  BP 120/70   Pulse 84   Temp (!) 97.3 F (36.3 C)   Resp 18   Ht 6' (1.829 m)   BMI 43.13 kg/m   BP/Weight 05/12/2020 01/01/2020 9/37/1696  Systolic BP 789 381 017  Diastolic BP 70 78 76  Wt. (Lbs) - 318 -  BMI 43.13 43.13 43.13    Physical Exam Vitals reviewed.  Constitutional:      Appearance: Normal appearance.  Neck:     Vascular: No carotid bruit.  Cardiovascular:  Rate and Rhythm: Normal rate and regular rhythm.     Heart sounds: Normal heart sounds.  Pulmonary:     Effort: Pulmonary effort is normal.     Breath sounds: Normal breath sounds.  Abdominal:     General: Bowel sounds are normal.     Palpations: Abdomen is soft.     Tenderness: There is no abdominal tenderness.  Musculoskeletal:        General: Normal range of motion.  Neurological:     Mental Status: She is alert.     Motor: Weakness present.  Psychiatric:        Mood and Affect: Mood normal.        Behavior: Behavior normal.      Lab Results  Component Value Date   WBC 7.6 05/12/2020   HGB 14.4 05/12/2020   HCT 43.3 05/12/2020   PLT 219 05/12/2020   GLUCOSE 150 (H) 05/12/2020   CHOL 144 05/12/2020   TRIG 151 (H) 05/12/2020   HDL 53 05/12/2020   LDLCALC 65 05/12/2020   ALT 18 05/12/2020   AST 28 05/12/2020   NA 140 05/12/2020   K 4.8 05/12/2020   CL 100 05/12/2020   CREATININE 0.99 05/12/2020   BUN 16 05/12/2020   CO2 27 05/12/2020   TSH 4.060 06/13/2019   HGBA1C 7.3 (H) 05/12/2020      Assessment & Plan:   1. Diabetic amyotrophy associated with type 2 diabetes mellitus ([place]) Recommended patient follow-up with neurology. Continue current medications. Check  feet daily. Recommend annual eye exams. Check sugars daily. Recommend diabetic diet. - CBC with Differential/Platelet - Hemoglobin A1c  2. Fibromyalgia Continue amitriptyline  3. Mixed hyperlipidemia Recommend low-fat diet. Exercise as able. Continue Crestor daily - Lipid panel  4. Essential hypertension Well-controlled. Continue current medications. Low salt diet. - Comprehensive metabolic panel  5.  Morbid obesity with BMI of 40.0-44.9, adult (HCC) Recommend healthy diet. Exercise as able from her wheelchair.  7. Other closed fracture of distal end of left tibia, initial encounter Follow-up with orthopedics  Orders Placed This Encounter  Procedures  . CBC with Differential/Platelet  . Hemoglobin A1c  . Lipid panel  . Comprehensive metabolic panel  . Cardiovascular Risk Assessment    Follow-up: Return in about 3 months (around 08/09/2020) for fasting.  An After Visit Summary was printed and given to the patient.  [name], MD [facility] [phone]

## 2020-05-12 ENCOUNTER — Ambulatory Visit (INDEPENDENT_AMBULATORY_CARE_PROVIDER_SITE_OTHER): Payer: Medicare HMO | Admitting: Family Medicine

## 2020-05-12 ENCOUNTER — Other Ambulatory Visit: Payer: Self-pay

## 2020-05-12 VITALS — BP 120/70 | HR 84 | Temp 97.3°F | Resp 18 | Ht 72.0 in

## 2020-05-12 DIAGNOSIS — E782 Mixed hyperlipidemia: Secondary | ICD-10-CM

## 2020-05-12 DIAGNOSIS — Z6841 Body Mass Index (BMI) 40.0 and over, adult: Secondary | ICD-10-CM | POA: Diagnosis not present

## 2020-05-12 DIAGNOSIS — E1144 Type 2 diabetes mellitus with diabetic amyotrophy: Secondary | ICD-10-CM | POA: Diagnosis not present

## 2020-05-12 DIAGNOSIS — M797 Fibromyalgia: Secondary | ICD-10-CM | POA: Diagnosis not present

## 2020-05-12 DIAGNOSIS — S82392A Other fracture of lower end of left tibia, initial encounter for closed fracture: Secondary | ICD-10-CM

## 2020-05-12 DIAGNOSIS — I10 Essential (primary) hypertension: Secondary | ICD-10-CM | POA: Diagnosis not present

## 2020-05-12 DIAGNOSIS — I739 Peripheral vascular disease, unspecified: Secondary | ICD-10-CM

## 2020-05-13 LAB — CBC WITH DIFFERENTIAL/PLATELET
Basophils Absolute: 0.1 10*3/uL (ref 0.0–0.2)
Basos: 1 %
EOS (ABSOLUTE): 0.2 10*3/uL (ref 0.0–0.4)
Eos: 3 %
Hematocrit: 43.3 % (ref 34.0–46.6)
Hemoglobin: 14.4 g/dL (ref 11.1–15.9)
Immature Grans (Abs): 0 10*3/uL (ref 0.0–0.1)
Immature Granulocytes: 1 %
Lymphocytes Absolute: 1.9 10*3/uL (ref 0.7–3.1)
Lymphs: 24 %
MCH: 28.5 pg (ref 26.6–33.0)
MCHC: 33.3 g/dL (ref 31.5–35.7)
MCV: 86 fL (ref 79–97)
Monocytes Absolute: 0.4 10*3/uL (ref 0.1–0.9)
Monocytes: 6 %
Neutrophils Absolute: 5 10*3/uL (ref 1.4–7.0)
Neutrophils: 65 %
Platelets: 219 10*3/uL (ref 150–450)
RBC: 5.05 x10E6/uL (ref 3.77–5.28)
RDW: 13.9 % (ref 11.7–15.4)
WBC: 7.6 10*3/uL (ref 3.4–10.8)

## 2020-05-13 LAB — HEMOGLOBIN A1C
Est. average glucose Bld gHb Est-mCnc: 163 mg/dL
Hgb A1c MFr Bld: 7.3 % — ABNORMAL HIGH (ref 4.8–5.6)

## 2020-05-13 LAB — LIPID PANEL
Chol/HDL Ratio: 2.7 ratio (ref 0.0–4.4)
Cholesterol, Total: 144 mg/dL (ref 100–199)
HDL: 53 mg/dL (ref >39)
LDL Chol Calc (NIH): 65 mg/dL (ref 0–99)
Triglycerides: 151 mg/dL — ABNORMAL HIGH (ref 0–149)
VLDL Cholesterol Cal: 26 mg/dL (ref 5–40)

## 2020-05-13 LAB — COMPREHENSIVE METABOLIC PANEL
ALT: 18 IU/L (ref 0–32)
AST: 28 IU/L (ref 0–40)
Albumin/Globulin Ratio: 2 (ref 1.2–2.2)
Albumin: 4.3 g/dL (ref 3.8–4.8)
Alkaline Phosphatase: 113 IU/L (ref 44–121)
BUN/Creatinine Ratio: 16 (ref 12–28)
BUN: 16 mg/dL (ref 8–27)
Bilirubin Total: 0.3 mg/dL (ref 0.0–1.2)
CO2: 27 mmol/L (ref 20–29)
Calcium: 10.5 mg/dL — ABNORMAL HIGH (ref 8.7–10.3)
Chloride: 100 mmol/L (ref 96–106)
Creatinine, Ser: 0.99 mg/dL (ref 0.57–1.00)
GFR calc Af Amer: 69 mL/min/{1.73_m2} (ref >59)
GFR calc non Af Amer: 60 mL/min/{1.73_m2} (ref >59)
Globulin, Total: 2.2 g/dL (ref 1.5–4.5)
Glucose: 150 mg/dL — ABNORMAL HIGH (ref 65–99)
Potassium: 4.8 mmol/L (ref 3.5–5.2)
Sodium: 140 mmol/L (ref 134–144)
Total Protein: 6.5 g/dL (ref 6.0–8.5)

## 2020-05-13 LAB — CARDIOVASCULAR RISK ASSESSMENT

## 2020-05-14 ENCOUNTER — Telehealth: Payer: Self-pay

## 2020-05-14 ENCOUNTER — Other Ambulatory Visit: Payer: Self-pay

## 2020-05-14 MED ORDER — TRESIBA FLEXTOUCH 100 UNIT/ML ~~LOC~~ SOPN: 65.0000 [IU] | PEN_INJECTOR | Freq: Every day | SUBCUTANEOUS | 0 refills | Status: DC

## 2020-05-14 NOTE — Progress Notes (Signed)
Chronic Care Management Pharmacy Assistant   Name: Christy Crawford  MRN: [id number] DOB: [birth year]  Reason for Encounter: Medication Review for acute fill    PCP : [name], MD  Allergies:  No Known Allergies  Medications: Outpatient Encounter Medications as of 05/14/2020  Medication Sig  . acetaminophen (TYLENOL) 325 MG tablet Take 325 mg by mouth every 6 (six) hours as needed.  . ALPRAZolam (XANAX) 1 MG tablet Take 1 mg by mouth in the morning, at noon, in the evening, and at bedtime.   Marland Kitchen amitriptyline (ELAVIL) 25 MG tablet Take 1 tablet (25 mg total) by mouth at bedtime.  Marland Kitchen aspirin-acetaminophen-caffeine (EXCEDRIN MIGRAINE) 250-250-65 MG tablet Take 2 tablets by mouth in the morning.  [name] Choice Lancets 28G MISC E11.4 Use new lancet each time when checking blood sugar  . COMFORT EZ PEN NEEDLES 31G X 5 MM MISC E11.44 use new needle with each new injection  . Dulaglutide (TRULICITY) 3 PO/2.4MP SOPN Inject 0.5 mLs (3 mg total) as directed once a week.  . furosemide (LASIX) 20 MG tablet TAKE ONE TABLET BY MOUTH ONCE DAILY  . glucose blood (CLEVER CHOICE MICRO TEST) test strip E11.44 Use new strip each time when checking blood sugar. Check sugar 1-3 times daily.  . insulin degludec (TRESIBA FLEXTOUCH) 100 UNIT/ML FlexTouch Pen Inject 75 Units into the skin daily. (Patient taking differently: Inject 65 Units into the skin daily.)  . lisinopril (ZESTRIL) 40 MG tablet TAKE 1 TABLET BY MOUTH EVERY MORNING AND1 TABLET BY MOUTH AT BEDTIME  . montelukast (SINGULAIR) 10 MG tablet TAKE ONE TABLET BY MOUTH EVERYDAY AT BEDTIME  . Multiple Vitamins-Minerals (CENTRUM SILVER PO) Take 1 tablet by mouth daily.  Marland Kitchen oxybutynin (DITROPAN) 5 MG tablet Take 1 tablet (5 mg total) by mouth 3 (three) times daily.  . pregabalin (LYRICA) 300 MG capsule Take 1 capsule (300 mg total) by mouth every evening.  . rosuvastatin (CRESTOR) 10 MG tablet TAKE ONE TABLET BY MOUTH ONCE DAILY. Due FOR FOLLOWING UP  office visits AND labs. No refills UNTIL seen  . sertraline (ZOLOFT) 100 MG tablet Take 1 tablet (100 mg total) by mouth 2 (two) times daily.  . Vitamin D, Cholecalciferol, 25 MCG (1000 UT) CAPS Take by mouth.   No facility-administered encounter medications on file as of 05/14/2020.    Current Diagnosis: Patient Active Problem List   Diagnosis Date Noted  . Pedal edema 09/23/2019  . Fibromyalgia 07/11/2019  . Class 3 severe obesity due to excess calories with serious comorbidity and body mass index (BMI) of 40.0 to 44.9 in adult ([place]) 07/11/2019  . Chronic right-sided low back pain without sciatica 06/21/2019  . Diabetic amyotrophy associated with type 2 diabetes mellitus ([place]) 06/21/2019  . Chronic pain of both shoulders 06/21/2019  . Chronic pain of right knee 06/21/2019  . Dyslipidemia associated with type 2 diabetes mellitus ([place]) 06/13/2019  . Mixed hyperlipidemia 06/13/2019    Patient's daughter called and stated that the patient was going to need some medication.   She needs  Sertraline, Alprazolam and Tresiba   I filled out an acute form and have sent it to Donette Larry, CPP for approval.  Patient daughter stated she was good with all her other medications.    I have will call patient assistance for Tresiba, per automated system her application has expired. Donette Larry, CPP has sent new form.  Follow-Up:  Comptroller and Pharmacist Review  Donette Larry, CPP  notified  Clarita Leber, [place] Pharmacist Assistant [phone]

## 2020-05-18 ENCOUNTER — Encounter: Payer: Self-pay | Admitting: Family Medicine

## 2020-05-19 ENCOUNTER — Telehealth: Payer: Self-pay

## 2020-05-19 DIAGNOSIS — E1144 Type 2 diabetes mellitus with diabetic amyotrophy: Secondary | ICD-10-CM

## 2020-05-19 NOTE — Telephone Encounter (Signed)
Christy Crawford states she did not follow up with neurology after discharge. She also does not remember who she saw in the past. In care team it states Dr. Krista Blue but CMA does not see any documentation since 2012. Pt also states she does not need PT/OT as she has family that helps her.   Put in neurology referral.

## 2020-05-19 NOTE — Telephone Encounter (Signed)
-----   Message from [name], MD sent at 05/18/2020  1:51 PM EST ----- Regarding: referrals Christy Crawford was supposed to follow up with neurology after discharge in October from [facility].  Please find out who she has seen previously and either ask her to call or do a referral.  Also, please ask if pt already has PT or OT coming out to her home. If not, I would recommend we order this. Dr. Tobie Poet

## 2020-05-19 NOTE — Telephone Encounter (Signed)
Agree. Christy Crawford

## 2020-05-27 ENCOUNTER — Telehealth: Payer: Medicare HMO

## 2020-05-27 DIAGNOSIS — H16103 Unspecified superficial keratitis, bilateral: Secondary | ICD-10-CM | POA: Diagnosis not present

## 2020-05-27 DIAGNOSIS — H524 Presbyopia: Secondary | ICD-10-CM | POA: Diagnosis not present

## 2020-05-27 DIAGNOSIS — Z794 Long term (current) use of insulin: Secondary | ICD-10-CM | POA: Diagnosis not present

## 2020-05-27 DIAGNOSIS — H26493 Other secondary cataract, bilateral: Secondary | ICD-10-CM | POA: Diagnosis not present

## 2020-05-27 DIAGNOSIS — Z961 Presence of intraocular lens: Secondary | ICD-10-CM | POA: Diagnosis not present

## 2020-05-27 DIAGNOSIS — E113313 Type 2 diabetes mellitus with moderate nonproliferative diabetic retinopathy with macular edema, bilateral: Secondary | ICD-10-CM | POA: Diagnosis not present

## 2020-05-27 NOTE — Progress Notes (Deleted)
Chronic Care Management Pharmacy Note  05/27/2020 Name:  Christy Crawford MRN:  [id number] DOB:  10-Mar-1956  Subjective: Christy Crawford is an 65 y.o. year old female who is a primary patient of Cox, Kirsten, MD.  The CCM team was consulted for assistance with disease management and care coordination needs.    Engaged with patient by telephone for follow up visit in response to provider referral for pharmacy case management and/or care coordination services.   Consent to Services:  The patient was given information about Chronic Care Management services, agreed to services, and gave verbal consent prior to initiation of services.  Please see initial visit note for detailed documentation.   Patient Care Team: [name], MD as PCP - General (Family Medicine) Marcial Pacas, MD as Consulting Physician (Neurology) Chucky May, MD as Consulting Physician (Psychiatry) Lynnell Dike, OD (Optometry) Burnice Logan, [facility] as Pharmacist (Pharmacist)  Recent office visits: 05/12/2020 - recommend healthy diet and exercise in wheel chair. Labs look good no changes to medication per Dr. Tobie Poet.   Recent consult visits: 04/21/2020 - ED visit for pain and weakness. Recent falls.   Hospital visits: {Hospital DC Yes/No:25215}  Objective:  Lab Results  Component Value Date   CREATININE 0.99 05/12/2020   BUN 16 05/12/2020   GFRNONAA 60 05/12/2020   GFRAA 69 05/12/2020   NA 140 05/12/2020   K 4.8 05/12/2020   CALCIUM 10.5 (H) 05/12/2020   CO2 27 05/12/2020    Lab Results  Component Value Date/Time   HGBA1C 7.3 (H) 05/12/2020 02:40 PM   HGBA1C 7.0 (H) 01/01/2020 04:04 PM   HGBA1C 7.5 02/12/2019 12:00 AM    Last diabetic Eye exam:  Lab Results  Component Value Date/Time   HMDIABEYEEXA Retinopathy (A) 05/07/2020 12:00 AM    Last diabetic Foot exam: No results found for: HMDIABFOOTEX   Lab Results  Component Value Date   CHOL 144 05/12/2020   HDL 53 05/12/2020   LDLCALC 65 05/12/2020    TRIG 151 (H) 05/12/2020   CHOLHDL 2.7 05/12/2020    Hepatic Function Latest Ref Rng & Units 05/12/2020 01/01/2020 09/19/2019  Total Protein 6.0 - 8.5 g/dL 6.5 6.8 7.0  Albumin 3.8 - 4.8 g/dL 4.3 4.5 4.7  AST 0 - 40 IU/L 28 34 43(H)  ALT 0 - 32 IU/L $Remov'18 26 31  'SDecHp$ Alk Phosphatase 44 - 121 IU/L 113 124(H) 118  Total Bilirubin 0.0 - 1.2 mg/dL 0.3 0.4 0.3    Lab Results  Component Value Date/Time   TSH 4.060 06/13/2019 10:33 AM    CBC Latest Ref Rng & Units 05/12/2020 01/01/2020 09/19/2019  WBC 3.4 - 10.8 x10E3/uL 7.6 8.0 7.2  Hemoglobin 11.1 - 15.9 g/dL 14.4 14.6 14.6  Hematocrit 34.0 - 46.6 % 43.3 43.0 44.9  Platelets 150 - 450 x10E3/uL 219 215 181    No results found for: VD25OH  Clinical ASCVD: {YES/NO:21197} The 10-year ASCVD risk score Mikey Bussing DC Jr., et al., 2013) is: 10.6%   Values used to calculate the score:     Age: 18 years     Sex: Female     Is Non-Hispanic African American: No     Diabetic: Yes     Tobacco smoker: No     Systolic Blood Pressure: 409 mmHg     Is BP treated: Yes     HDL Cholesterol: 53 mg/dL     Total Cholesterol: 144 mg/dL    Depression screen [facility] 2/9 05/12/2020  Decreased Interest  0  Down, Depressed, Hopeless 0  PHQ - 2 Score 0     ***Other: (CHADS2VASc if Afib, MMRC or CAT for COPD, ACT, DEXA)  Social History   Tobacco Use  Smoking Status Former Smoker  . Types: Cigarettes  . Quit date: 2000  . Years since quitting: 22.1  Smokeless Tobacco Never Used   BP Readings from Last 3 Encounters:  05/12/20 120/70  01/01/20 (!) 142/78  09/19/19 (!) 142/76   Pulse Readings from Last 3 Encounters:  05/12/20 84  01/01/20 91  09/19/19 (!) 103   Wt Readings from Last 3 Encounters:  01/01/20 (!) 318 lb (144.2 kg)  07/11/19 (!) 318 lb (144.2 kg)  06/13/19 (!) 316 lb (143.3 kg)    Assessment/Interventions: Review of patient past medical history, allergies, medications, health status, including review of consultants reports, laboratory and other test  data, was performed as part of comprehensive evaluation and provision of chronic care management services.   SDOH:  (Social Determinants of Health) assessments and interventions performed: Yes   CCM Care Plan  No Known Allergies  Medications Reviewed Today    Reviewed by [name], MD (Physician) on 05/18/20 at 1348  Med List Status: <None>  Medication Order Taking? Sig Documenting Provider Last Dose Status Informant  acetaminophen (TYLENOL) 325 MG tablet [id number] Yes Take 325 mg by mouth every 6 (six) hours as needed. [provider] Taking Active   ALPRAZolam Duanne Moron) 1 MG tablet [id number] Yes Take 1 mg by mouth in the morning, at noon, in the evening, and at bedtime.  [provider] Taking Active   amitriptyline (ELAVIL) 25 MG tablet [id number] Yes Take 1 tablet (25 mg total) by mouth at bedtime. Cox, Kirsten, MD Taking Active   aspirin-acetaminophen-caffeine [facility] MIGRAINE) [phone] MG tablet [id number] Yes Take 2 tablets by mouth in the morning. [provider] Taking Active   Clever Choice Lancets 28G [place] [id number] Yes E11.4 Use new lancet each time when checking blood sugar Cox, Kirsten, MD Taking Active   COMFORT EZ PEN NEEDLES 31G X 5 MM MISC [id number] Yes E11.44 use new needle with each new injection Cox, Kirsten, MD Taking Active   Dulaglutide (TRULICITY) 3 DP/8.2UM SOPN [id number] Yes Inject 0.5 mLs (3 mg total) as directed once a week. Cox, Kirsten, MD Taking Active   furosemide (LASIX) 20 MG tablet [id number] Yes TAKE ONE TABLET BY MOUTH ONCE DAILY Cox, Kirsten, MD Taking Active   glucose blood (CLEVER CHOICE MICRO TEST) test strip [id number] Yes E11.44 Use new strip each time when checking blood sugar. Check sugar 1-3 times daily. Cox, Kirsten, MD Taking Active   insulin degludec (TRESIBA FLEXTOUCH) 100 UNIT/ML FlexTouch Pen [id number]  Inject 65 Units into the skin daily. Cox, Kirsten, MD  Active   lisinopril (ZESTRIL) 40 MG tablet [id number] Yes  TAKE 1 TABLET BY MOUTH EVERY MORNING AND1 TABLET BY MOUTH AT BEDTIME Cox, Kirsten, MD Taking Active   montelukast (SINGULAIR) 10 MG tablet [id number] Yes TAKE ONE TABLET BY MOUTH EVERYDAY AT BEDTIME Cox, Kirsten, MD Taking Active   Multiple Vitamins-Minerals (CENTRUM SILVER PO) [id number] Yes Take 1 tablet by mouth daily. [provider] Taking Active   oxybutynin (DITROPAN) 5 MG tablet [id number] Yes Take 1 tablet (5 mg total) by mouth 3 (three) times daily. Cox, Kirsten, MD Taking Active   pregabalin (LYRICA) 300 MG capsule [id number] Yes Take 1 capsule (300 mg total) by mouth every evening. Cox, Kirsten, MD Taking Active   rosuvastatin (CRESTOR) 10 MG tablet  [id number] Yes TAKE ONE TABLET BY MOUTH ONCE DAILY. Due FOR FOLLOWING UP office visits AND labs. No refills UNTIL seen Cox, Elnita Maxwell, MD Taking Active   sertraline (ZOLOFT) 100 MG tablet [id number] Yes Take 1 tablet (100 mg total) by mouth 2 (two) times daily. Cox, Kirsten, MD Taking Active   Vitamin D, Cholecalciferol, 25 MCG (1000 UT) CAPS [id number] Yes Take by mouth. [provider] Taking Active           Patient Active Problem List   Diagnosis Date Noted  . Pedal edema 09/23/2019  . Fibromyalgia 07/11/2019  . Class 3 severe obesity due to excess calories with serious comorbidity and body mass index (BMI) of 40.0 to 44.9 in adult ([place]) 07/11/2019  . Chronic right-sided low back pain without sciatica 06/21/2019  . 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 Chronic pain of both shoulders 06/21/2019  . Chronic pain of right knee 06/21/2019  . Dyslipidemia associated with type 2 diabetes mellitus ([place]) 06/13/2019  . Mixed hyperlipidemia 06/13/2019    Immunization History  Administered Date(s) Administered  . Fluad Quad(high Dose 65+) 01/01/2020  . Influenza-Unspecified 08/25/2017, 02/20/2018, 02/12/2019  . Pneumococcal Polysaccharide-23 01/01/2020    Conditions to be  addressed/monitored:  Hypertension, Hyperlipidemia, Diabetes, Depression, Anxiety and Osteoporosis  There are no care plans that you recently modified to display for this patient.    Medication Assistance: Tyler Aas and Trulicity obtained through [facility]  medication assistance program.  Enrollment ends 04/04/2021  Patient's preferred pharmacy is:  [facility] [place], [place] - [address] [place] [address] Phone: [phone] Fax: [phone]  [place], [place] - [address] [place] [address] Phone: [phone] Fax: [phone]  Uses pill box? Yes Pt endorses ***% compliance  We discussed: Benefits of medication synchronization, packaging and delivery as well as enhanced pharmacist oversight with [facility]. Patient decided to: Utilize [facility] pharmacy for medication synchronization, packaging and delivery  Care Plan and Follow Up Patient Decision:  Patient agrees to Care Plan and Follow-up.  Plan: Telephone follow up appointment with care management team member scheduled for:  ***  Current Barriers:  . Unable to independently afford treatment regimen . ***  Pharmacist Clinical Goal(s):  Marland Kitchen Over the next 90 days, patient will maintain control of blood pressure, cholesterol and blood sugar as evidenced by lab results  through collaboration with PharmD and provider.    Interventions: . 1:1 collaboration with [name], MD regarding development and update of comprehensive plan of care as evidenced by provider attestation and co-signature . Inter-disciplinary care team collaboration (see longitudinal plan of care) . Comprehensive medication review performed; medication list updated in electronic medical record  Hypertension (BP goal <130/80) -controlled -Current treatment: . furosemide 20 mg daily  . Lisinopril 40 mg am and pm  -Medications previously tried:  ***  -Current home readings: *** -Current dietary habits: *** -Current exercise habits: *** -{ACTIONS;DENIES/REPORTS:21021675::"Denies"} hypotensive/hypertensive symptoms -Educated on {CCM BP Counseling:25124} -Counseled to monitor BP at home ***, document, and provide log at future appointments -{CCMPHARMDINTERVENTION:25122}  Hyperlipidemia: (LDL goal < ***) -{CHL Controlled/Uncontrolled:6812233838} -Current treatment: . ***rosuvastatin 10 mg daily  -Medications previously tried: ***  -Current dietary patterns: *** -Current exercise habits: *** -Educated on {CCM HLD Counseling:25126} -{CCMPHARMDINTERVENTION:25122}  Diabetes (A1c goal {A1c goals:23924}) -{CHL Controlled/Uncontrolled:6812233838} -Current medications: . Trulicity 3 mg weekly  . Tresiba 65 units daily  -Medications previously  tried: *** glimepiride -Current home glucose readings . fasting glucose: *** . post prandial glucose: *** -{ACTIONS;DENIES/REPORTS:21021675::"Denies"} hypoglycemic/hyperglycemic symptoms -Current meal patterns:  . breakfast: ***  . lunch: ***  . dinner: *** . snacks: *** . drinks: *** -Current exercise: *** -Educated on{CCM DM COUNSELING:25123} -Counseled to check feet daily and get yearly eye exams -{CCMPHARMDINTERVENTION:25122}   *** (Goal: ***) -{CHL Controlled/Uncontrolled:([phone]} -Current treatment  . ***alprazolam 1 mg morning, noon, evening and betdime . Sertraline 100 mg bid  -Medications previously tried: ***  -{CCMPHARMDINTERVENTION:25122}  Fibromyalgia and Nerve Pain  (Goal: ***) -{CHL Controlled/Uncontrolled:([phone]} -Current treatment  . Amitriptyline 25 mg at bedtime . Pregabalin 300 mg daily  -Medications previously tried: ***  -{CCMPHARMDINTERVENTION:25122}   *** (Goal: ***) -{CHL Controlled/Uncontrolled:([phone]} -Current treatment  . ***oxybutynin 5 mg three times daily  -Medications previously tried: ***   -{CCMPHARMDINTERVENTION:25122}  Osteoporosis / Osteopenia (Goal ***) -{CHL Controlled/Uncontrolled:([phone] [phone]} -Last DEXA Scan: ***   T-Score femoral neck: ***  T-Score total hip: ***  T-Score lumbar spine: ***  T-Score forearm radius: ***  10-year probability of major osteoporotic fracture: ***  10-year probability of hip fracture: *** -Patient {is;is not an osteoporosis candidate:23886} -Current treatment  . Vitamin D 1000 *** -Medications previously tried: ***  -{Osteoporosis Counseling:23892} -{CCMPHARMDINTERVENTION:25122}  *** (Goal: ***) -{CHL Controlled/Uncontrolled:([phone]} -Current treatment  . Montelukast 10 mg daily at bedtime *** -Medications previously tried: ***  -{CCMPHARMDINTERVENTION:25122}    Health Maintenance -Vaccine gaps: *** -Current therapy:  . acetaminophen 325 mg every 6 hours prn . Aspirin-acetaminophen-caffeine 2 tablets by mouth in the morning -Educated on {ccm supplement counseling:25128} -{CCM Patient satisfied:25129} -{CCMPHARMDINTERVENTION:25122}   Patient Goals/Self-Care Activities . Over the next *** days, patient will:  - {pharmacypatientgoals:24919}  Follow Up Plan: {CM FOLLOW UP ZMOQ:94765}

## 2020-05-29 ENCOUNTER — Ambulatory Visit (INDEPENDENT_AMBULATORY_CARE_PROVIDER_SITE_OTHER): Payer: Medicare HMO

## 2020-05-29 ENCOUNTER — Telehealth: Payer: Self-pay

## 2020-05-29 ENCOUNTER — Other Ambulatory Visit: Payer: Self-pay

## 2020-05-29 DIAGNOSIS — E1144 Type 2 diabetes mellitus with diabetic amyotrophy: Secondary | ICD-10-CM | POA: Diagnosis not present

## 2020-05-29 DIAGNOSIS — E782 Mixed hyperlipidemia: Secondary | ICD-10-CM | POA: Diagnosis not present

## 2020-05-29 DIAGNOSIS — I10 Essential (primary) hypertension: Secondary | ICD-10-CM | POA: Diagnosis not present

## 2020-05-29 NOTE — Patient Instructions (Addendum)
Visit Information  Goals Addressed            This Visit's Progress   . Manage My Medicine       Timeframe:  Long-Range Goal Priority:  High Start Date:           05/29/2020                  Expected End Date: 05/29/2021                      Follow Up Date 09/26/2020    - call for medicine refill 2 or 3 days before it runs out - keep a list of all the medicines I take; vitamins and herbals too - use a pillbox to sort medicine    Why is this important?   . These steps will help you keep on track with your medicines.   Notes:     Marland Kitchen Monitor and Manage My Blood Sugar-Diabetes Type 2       Timeframe:  Long-Range Goal Priority:  High Start Date:        05/29/2020                     Expected End Date:  05/29/2021                     Follow Up Date 09/26/2020    - check blood sugar at prescribed times - check blood sugar if I feel it is too high or too low - enter blood sugar readings and medication or insulin into daily log - take the blood sugar log to all doctor visits    Why is this important?    Checking your blood sugar at home helps to keep it from getting very high or very low.   Writing the results in a diary or log helps the doctor know how to care for you.   Your blood sugar log should have the time, date and the results.   Also, write down the amount of insulin or other medicine that you take.   Other information, like what you ate, exercise done and how you were feeling, will also be helpful.     Notes:     . Track and Manage My Blood Pressure-Hypertension       Timeframe:  Long-Range Goal Priority:  High Start Date:         05/29/2020                    Expected End Date:   05/29/2021                    Follow Up Date 09/26/2020    - check blood pressure daily - write blood pressure results in a log or diary    Why is this important?    You won't feel high blood pressure, but it can still hurt your blood vessels.   High blood pressure can  cause heart or kidney problems. It can also cause a stroke.   Making lifestyle changes like losing a little weight or eating less salt will help.   Checking your blood pressure at home and at different times of the day can help to control blood pressure.   If the doctor prescribes medicine remember to take it the way the doctor ordered.   Call the office if you cannot afford the medicine or if there are questions  about it.     Notes:       Patient Care Plan: [facility] pharmacy care plan    Problem Identified: [id number],[id number]   Priority: High  Onset Date: 05/29/2020    Long-Range Goal: disease management   Start Date: 05/29/2020  Expected End Date: 05/29/2021  This Visit's Progress: On track  Priority: High  Note:    Current Barriers:  . Unable to independently afford treatment regimen  Pharmacist Clinical Goal(s):  Marland Kitchen Over the next 90 days, patient will maintain control of blood pressure, cholesterol and blood sugar as evidenced by lab results  through collaboration with PharmD and provider.    Interventions: . 1:1 collaboration with Blane Ohara, MD regarding development and update of comprehensive plan of care as evidenced by provider attestation and co-signature . Inter-disciplinary care team collaboration (see longitudinal plan of care) . Comprehensive medication review performed; medication list updated in electronic medical record  Hypertension (BP goal <130/80) -controlled -Current treatment: . furosemide 20 mg daily  . Lisinopril 40 mg am and pm  -Medications previously tried: none reported -Current home readings: 130/70 -Current dietary habits: has very little appetite lately. States she is eating too late causing higher fasting blood sugars.  -Current exercise habits: physical therapy exercises with her husband and walking with a walker.  -Denies hypotensive/hypertensive symptoms -Educated on BP goals and benefits of medications for prevention of heart attack, stroke and  kidney damage; Daily salt intake goal < 2300 mg; Exercise goal of 150 minutes per week; -Counseled to monitor BP at home daily, document, and provide log at future appointments -Counseled on diet and exercise extensively Recommended to continue current medication  Hyperlipidemia: (LDL goal < 70) -controlled -Current treatment: . rosuvastatin 10 mg daily  -Medications previously tried: none reported  -Current dietary patterns: patient has limited appetite. Avoids cookies/cakes. Eating later in the evening before insulin.  -Current exercise habits: completing physical therapy exercises daily at home with her husband. Walking with walker at home.  -Educated on Cholesterol goals;  Benefits of statin for ASCVD risk reduction; Importance of limiting foods high in cholesterol; Exercise goal of 150 minutes per week; -Counseled on diet and exercise extensively Recommended to continue current medication  Diabetes (A1c goal <7%) -uncontrolled -Current medications: . Trulicity 3 mg weekly  . Tresiba 65 units daily  -Medications previously tried: glimepiride -Current home glucose readings . fasting glucose: 150, 156, 134  today. Typically between 80-115 . post prandial glucose: not checking  -Denies hypoglycemic/hyperglycemic symptoms -Current meal patterns:  . breakfast: limited appetite . lunch: limited appetite . dinner: eating something at supper but too late and causing increased fasting blood sugar . snacks: denies cookies and cakes . drinks: water and coffee -Current exercise: working on physical therapy exercises at home.  -Educated onA1c and blood sugar goals; Exercise goal of 150 minutes per week; Prevention and management of hypoglycemic episodes; -Counseled to check feet daily and get yearly eye exams -Counseled on diet and exercise extensively Recommended to continue current medication   Fibromyalgia and Nerve Pain  (Goal: manage pain and symptoms) -controlled -Current  treatment  . Amitriptyline 25 mg at bedtime . Pregabalin 300 mg daily  -Medications previously tried: none reported  -Recommended to continue current medication   Osteoporosis / Osteopenia (Goal get updated Dexa scan) -Last DEXA Scan: patient has not scheduled  -Current treatment  . Vitamin D 1000 daily  -Medications previously tried: none reported  -Recommend [phone] units of vitamin D daily. Recommend 1200 mg of calcium  daily from dietary and supplemental sources. Recommend weight-bearing and muscle strengthening exercises for building and maintaining bone density. -Counseled on diet and exercise extensively Recommended patient schedule Dexa scan  Health Maintenance -Vaccine gaps: Patient not interested in COVID Vaccine. Shingles and TDAP are recommended.  -Current therapy:  . acetaminophen 325 mg every 6 hours prn . Aspirin-acetaminophen-caffeine 2 tablets by mouth in the morning -Educated on COVID vaccine.  -Patient is satisfied with current therapy and denies issues -Recommended to continue current medication   Patient Goals/Self-Care Activities . Over the next 90 days, patient will:  - take medications as prescribed check glucose daily, document, and provide at future appointments check blood pressure daily, document, and provide at future appointments collaborate with provider on medication access solutions  Follow Up Plan: Telephone follow up appointment with care management team member scheduled for: 09/2020      The patient verbalized understanding of instructions, educational materials, and care plan provided today and declined offer to receive copy of patient instructions, educational materials, and care plan.  Telephone follow up appointment with pharmacy team member scheduled for: 06/22  Burnice Logan, [facility]  Exercises to do While Sitting  Exercises that you do while sitting (chair exercises) can give you many of the same benefits as full exercise. Benefits  include strengthening your heart, burning calories, and keeping muscles and joints healthy. Exercise can also improve your mood and help with depression and anxiety. You may benefit from chair exercises if you are unable to do standing exercises because of:  Diabetic foot pain.  Obesity.  Illness.  Arthritis.  Recovery from surgery or injury.  Breathing problems.  Balance problems.  Another type of disability. Before starting chair exercises, check with your health care provider or a physical therapist to find out how much exercise you can tolerate and which exercises are safe for you. If your health care provider approves:  Start out slowly and build up over time. Aim to work up to about 10-20 minutes for each exercise session.  Make exercise part of your daily routine.  Drink water when you exercise. Do not wait until you are thirsty. Drink every 10-15 minutes.  Stop exercising right away if you have pain, nausea, shortness of breath, or dizziness.  If you are exercising in a wheelchair, make sure to lock the wheels.  Ask your health care provider whether you can do tai chi or yoga. Many positions in these mind-body exercises can be modified to do while seated. Warm-up Before starting other exercises: 1. Sit up as straight as you can. Have your knees bent at 90 degrees, which is the shape of the capital letter "L." Keep your feet flat on the floor. 2. Sit at the front edge of your chair, if you can. 3. Pull in (tighten) the muscles in your abdomen and stretch your spine and neck as straight as you can. Hold this position for a few minutes. 4. Breathe in and out evenly. Try to concentrate on your breathing, and relax your mind. Stretching Exercise A: Arm stretch 1. Hold your arms out straight in front of your body. 2. Bend your hands at the wrist with your fingers pointing up, as if signaling someone to stop. Notice the slight tension in your forearms as you hold the  position. 3. Keeping your arms out and your hands bent, rotate your hands outward as far as you can and hold this stretch. Aim to have your thumbs pointing up and your pinkie fingers pointing  down. Slowly repeat arm stretches for one minute as tolerated. Exercise B: Leg stretch 1. If you can move your legs, try to "draw" letters on the floor with the toes of your foot. Write your name with one foot. 2. Write your name with the toes of your other foot. Slowly repeat the movements for one minute as tolerated. Exercise C: Reach for the sky 1. Reach your hands as far over your head as you can to stretch your spine. 2. Move your hands and arms as if you are climbing a rope. Slowly repeat the movements for one minute as tolerated. Range of motion exercises Exercise A: Shoulder roll 1. Let your arms hang loosely at your sides. 2. Lift just your shoulders up toward your ears, then let them relax back down. 3. When your shoulders feel loose, rotate your shoulders in backward and forward circles. Do shoulder rolls slowly for one minute as tolerated. Exercise B: March in place 1. As if you are marching, pump your arms and lift your legs up and down. Lift your knees as high as you can. ? If you are unable to lift your knees, just pump your arms and move your ankles and feet up and down. March in place for one minute as tolerated. Exercise C: Seated jumping jacks 1. Let your arms hang down straight. 2. Keeping your arms straight, lift them up over your head. Aim to point your fingers to the ceiling. 3. While you lift your arms, straighten your legs and slide your heels along the floor to your sides, as wide as you can. 4. As you bring your arms back down to your sides, slide your legs back together. ? If you are unable to use your legs, just move your arms. Slowly repeat seated jumping jacks for one minute as tolerated. Strengthening exercises Exercise A: Shoulder squeeze 1. Hold your arms straight  out from your body to your sides, with your elbows bent and your fists pointed at the ceiling. 2. Keeping your arms in the bent position, move them forward so your elbows and forearms meet in front of your face. 3. Open your arms back out as wide as you can with your elbows still bent, until you feel your shoulder blades squeezing together. Hold for 5 seconds. Slowly repeat the movements forward and backward for one minute as tolerated. Contact a health care provider if you:  Had to stop exercising due to any of the following: ? Pain. ? Nausea. ? Shortness of breath. ? Dizziness. ? Fatigue.  Have significant pain or soreness after exercising. Get help right away if you have:  Chest pain.  Difficulty breathing. These symptoms may represent a serious problem that is an emergency. Do not wait to see if the symptoms will go away. Get medical help right away. Call your local emergency services (911 in the [place] Do not drive yourself to the hospital. This information is not intended to replace advice given to you by your health care provider. Make sure you discuss any questions you have with your health care provider. Document Revised: 07/19/2019 Document Reviewed: 07/19/2019 Elsevier Patient Education  2021 [employer].

## 2020-05-29 NOTE — Progress Notes (Signed)
Chronic Care Management Pharmacy Assistant   Name: TAMMIE YANDA  MRN: [id number] DOB: [birth year]  Reason for Encounter: Medication Review for [facility]   PCP : [name], MD  Allergies:  No Known Allergies  Medications: Outpatient Encounter Medications as of 05/29/2020  Medication Sig  . acetaminophen (TYLENOL) 325 MG tablet Take 325 mg by mouth every 6 (six) hours as needed.  . ALPRAZolam (XANAX) 1 MG tablet Take 1 mg by mouth in the morning, at noon, in the evening, and at bedtime.   Marland Kitchen amitriptyline (ELAVIL) 25 MG tablet Take 1 tablet (25 mg total) by mouth at bedtime.  Marland Kitchen aspirin-acetaminophen-caffeine (EXCEDRIN MIGRAINE) 250-250-65 MG tablet Take 2 tablets by mouth in the morning.  [name] Choice Lancets 28G MISC E11.4 Use new lancet each time when checking blood sugar  . COMFORT EZ PEN NEEDLES 31G X 5 MM MISC E11.44 use new needle with each new injection  . Dulaglutide (TRULICITY) 3 FF/6.3WG SOPN Inject 0.5 mLs (3 mg total) as directed once a week.  . furosemide (LASIX) 20 MG tablet TAKE ONE TABLET BY MOUTH ONCE DAILY  . glucose blood (CLEVER CHOICE MICRO TEST) test strip E11.44 Use new strip each time when checking blood sugar. Check sugar 1-3 times daily.  . insulin degludec (TRESIBA FLEXTOUCH) 100 UNIT/ML FlexTouch Pen Inject 65 Units into the skin daily.  Marland Kitchen lisinopril (ZESTRIL) 40 MG tablet TAKE 1 TABLET BY MOUTH EVERY MORNING AND1 TABLET BY MOUTH AT BEDTIME  . montelukast (SINGULAIR) 10 MG tablet TAKE ONE TABLET BY MOUTH EVERYDAY AT BEDTIME  . Multiple Vitamins-Minerals (CENTRUM SILVER PO) Take 1 tablet by mouth daily.  Marland Kitchen oxybutynin (DITROPAN) 5 MG tablet Take 1 tablet (5 mg total) by mouth 3 (three) times daily.  . pregabalin (LYRICA) 300 MG capsule Take 1 capsule (300 mg total) by mouth every evening.  . rosuvastatin (CRESTOR) 10 MG tablet TAKE ONE TABLET BY MOUTH ONCE DAILY. Due FOR FOLLOWING UP office visits AND labs. No refills UNTIL seen  . sertraline (ZOLOFT) 100  MG tablet Take 1 tablet (100 mg total) by mouth 2 (two) times daily.  . Vitamin D, Cholecalciferol, 25 MCG (1000 UT) CAPS Take by mouth.   No facility-administered encounter medications on file as of 05/29/2020.    Current Diagnosis: Patient Active Problem List   Diagnosis Date Noted  . Pedal edema 09/23/2019  . Fibromyalgia 07/11/2019  . Class 3 severe obesity due to excess calories with serious comorbidity and body mass index (BMI) of 40.0 to 44.9 in adult ([place]) 07/11/2019  . Chronic right-sided low back pain without sciatica 06/21/2019  . 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 Chronic pain of both shoulders 06/21/2019  . Chronic pain of right knee 06/21/2019  . Dyslipidemia associated with type 2 diabetes mellitus ([place]) 06/13/2019  . Mixed hyperlipidemia 06/13/2019   Received a message from [facility] that they had not received a form for her next medication delivery.  She did get acute fills on 04/30/20 and 05/15/20 for different medications.    I have tried to reach the patient, I have left her a voice mail and I have sent a text.  Her daughter usually calls when she needs something.    I also have inquired if she has scheduled her Dexa and mammogram.  I called [employer] about her Tyler Aas, per the representative her Dec 6659 application had not been processed.  The lady processed it and she was approved thru  then end of 2022, and should arrive in the office in about 15 days   I have updated [facility]  The patient called Donette Larry, CPP, today, and stated their insurance was trying to trick them into leaving [facility].  Per her husband they are trying to stop this process.  Donette Larry, CPP has put in an acute form with [facility] for the medications she needs.    Per Donette Larry, CPP, her husband is very thankful for [place] and [facility].   Follow-Up:  Coordination of Enhanced Pharmacy Services and Pharmacist Review  Donette Larry, [place],  [place] Pharmacist Assistant [phone]

## 2020-05-29 NOTE — Progress Notes (Signed)
Chronic Care Management Pharmacy Note  05/29/2020 Name:  Christy Crawford MRN:  [id number] DOB:  12/20/1955  Subjective: Christy Crawford is an 65 y.o. year old female who is a primary patient of Cox, Kirsten, MD.  The CCM team was consulted for assistance with disease management and care coordination needs.    Engaged with patient by telephone for follow up visit in response to provider referral for pharmacy case management and/or care coordination services.   Consent to Services:  The patient was given information about Chronic Care Management services, agreed to services, and gave verbal consent prior to initiation of services.  Please see initial visit note for detailed documentation.   Patient Care Team: [name], MD as PCP - General (Family Medicine) Marcial Pacas, MD as Consulting Physician (Neurology) Chucky May, MD as Consulting Physician (Psychiatry) Lynnell Dike, OD (Optometry) Burnice Logan, [facility] as Pharmacist (Pharmacist)  Recent office visits: 05/12/2020 - recommend healthy diet and exercise in wheel chair. Labs look good no changes to medication per Dr. Tobie Poet.   Recent consult visits: 04/21/2020 - ED visit for pain and weakness. Recent falls.   Hospital visits:   Objective:  Lab Results  Component Value Date   CREATININE 0.99 05/12/2020   BUN 16 05/12/2020   GFRNONAA 60 05/12/2020   GFRAA 69 05/12/2020   NA 140 05/12/2020   K 4.8 05/12/2020   CALCIUM 10.5 (H) 05/12/2020   CO2 27 05/12/2020    Lab Results  Component Value Date/Time   HGBA1C 7.3 (H) 05/12/2020 02:40 PM   HGBA1C 7.0 (H) 01/01/2020 04:04 PM   HGBA1C 7.5 02/12/2019 12:00 AM    Last diabetic Eye exam:  Lab Results  Component Value Date/Time   HMDIABEYEEXA Retinopathy (A) 05/07/2020 12:00 AM    Last diabetic Foot exam: No results found for: HMDIABFOOTEX   Lab Results  Component Value Date   CHOL 144 05/12/2020   HDL 53 05/12/2020   LDLCALC 65 05/12/2020   TRIG 151 (H) 05/12/2020    CHOLHDL 2.7 05/12/2020    Hepatic Function Latest Ref Rng & Units 05/12/2020 01/01/2020 09/19/2019  Total Protein 6.0 - 8.5 g/dL 6.5 6.8 7.0  Albumin 3.8 - 4.8 g/dL 4.3 4.5 4.7  AST 0 - 40 IU/L 28 34 43(H)  ALT 0 - 32 IU/L _0 Alk Phosphatase 44 - 121 IU/L 113 124(H) 118  Total Bilirubin 0.0 - 1.2 mg/dL 0.3 0.4 0.3    Lab Results  Component Value Date/Time   TSH 4.060 06/13/2019 10:33 AM    CBC Latest Ref Rng & Units 05/12/2020 01/01/2020 09/19/2019  WBC 3.4 - 10.8 x10E3/uL 7.6 8.0 7.2  Hemoglobin 11.1 - 15.9 g/dL 14.4 14.6 14.6  Hematocrit 34.0 - 46.6 % 43.3 43.0 44.9  Platelets 150 - 450 x10E3/uL 219 215 181    No results found for: VD25OH  Clinical ASCVD: No  The 10-year ASCVD risk score Mikey Bussing DC Jr., et al., 2013) is: 10.6%   Values used to calculate the score:     Age: 73 years     Sex: Female     Is Non-Hispanic African American: No     Diabetic: Yes     Tobacco smoker: No     Systolic Blood Pressure: 627 mmHg     Is BP treated: Yes     HDL Cholesterol: 53 mg/dL     Total Cholesterol: 144 mg/dL    Depression screen [facility] 2/9 05/29/2020 05/12/2020  Decreased Interest  0 0  Down, Depressed, Hopeless 0 0  PHQ - 2 Score 0 0     Social History   Tobacco Use  Smoking Status Former Smoker  . Types: Cigarettes  . Quit date: 2000  . Years since quitting: 22.1  Smokeless Tobacco Never Used   BP Readings from Last 3 Encounters:  05/12/20 120/70  01/01/20 (!) 142/78  09/19/19 (!) 142/76   Pulse Readings from Last 3 Encounters:  05/12/20 84  01/01/20 91  09/19/19 (!) 103   Wt Readings from Last 3 Encounters:  01/01/20 (!) 318 lb (144.2 kg)  07/11/19 (!) 318 lb (144.2 kg)  06/13/19 (!) 316 lb (143.3 kg)    Assessment/Interventions: Review of patient past medical history, allergies, medications, health status, including review of consultants reports, laboratory and other test data, was performed as part of comprehensive evaluation and provision of chronic care  management services.   SDOH:  (Social Determinants of Health) assessments and interventions performed: Yes   CCM Care Plan  No Known Allergies  Medications Reviewed Today    Reviewed by Burnice Logan, [facility] (Pharmacist) on 05/29/20 at 1354  Med List Status: <None>  Medication Order Taking? Sig Documenting Provider Last Dose Status Informant  acetaminophen (TYLENOL) 325 MG tablet [id number] Yes Take 325 mg by mouth every 6 (six) hours as needed. [provider] Taking Active   ALPRAZolam Duanne Moron) 1 MG tablet [id number] Yes Take 1 mg by mouth in the morning, at noon, in the evening, and at bedtime.  [provider] Taking Active   amitriptyline (ELAVIL) 25 MG tablet [id number] Yes Take 1 tablet (25 mg total) by mouth at bedtime. Cox, Kirsten, MD Taking Active   aspirin-acetaminophen-caffeine [facility] MIGRAINE) [phone] MG tablet [id number] Yes Take 2 tablets by mouth in the morning. [provider] Taking Active   Clever Choice Lancets 28G [place] [id number] Yes E11.4 Use new lancet each time when checking blood sugar Cox, Kirsten, MD Taking Active   COMFORT EZ PEN NEEDLES 31G X 5 MM MISC [id number] Yes E11.44 use new needle with each new injection Cox, Kirsten, MD Taking Active   Dulaglutide (TRULICITY) 3 LT/9.0ZE SOPN [id number] Yes Inject 0.5 mLs (3 mg total) as directed once a week. Cox, Kirsten, MD Taking Active   furosemide (LASIX) 20 MG tablet [id number] Yes TAKE ONE TABLET BY MOUTH ONCE DAILY Cox, Kirsten, MD Taking Active   glucose blood (CLEVER CHOICE MICRO TEST) test strip [id number] Yes E11.44 Use new strip each time when checking blood sugar. Check sugar 1-3 times daily. Cox, Kirsten, MD Taking Active   insulin degludec (TRESIBA FLEXTOUCH) 100 UNIT/ML FlexTouch Pen [id number] Yes Inject 65 Units into the skin daily. Cox, Kirsten, MD Taking Active   lisinopril (ZESTRIL) 40 MG tablet [id number] Yes TAKE 1 TABLET BY MOUTH EVERY MORNING AND1 TABLET BY MOUTH AT BEDTIME Cox,  Kirsten, MD Taking Active   montelukast (SINGULAIR) 10 MG tablet [id number] Yes TAKE ONE TABLET BY MOUTH EVERYDAY AT BEDTIME Cox, Kirsten, MD Taking Active   Multiple Vitamins-Minerals (CENTRUM SILVER PO) [id number] Yes Take 1 tablet by mouth daily. [provider] Taking Active   oxybutynin (DITROPAN) 5 MG tablet [id number] Yes Take 1 tablet (5 mg total) by mouth 3 (three) times daily. Cox, Kirsten, MD Taking Active   pregabalin (LYRICA) 300 MG capsule [id number] Yes Take 1 capsule (300 mg total) by mouth every evening. Cox, Kirsten, MD Taking Active   rosuvastatin (CRESTOR) 10 MG tablet [id number] Yes TAKE ONE TABLET BY MOUTH ONCE  DAILY. Due FOR FOLLOWING UP office visits AND labs. No refills UNTIL seen Cox, Elnita Maxwell, MD Taking Active   sertraline (ZOLOFT) 100 MG tablet [id number] Yes Take 1 tablet (100 mg total) by mouth 2 (two) times daily. Cox, Kirsten, MD Taking Active   Vitamin D, Cholecalciferol, 25 MCG (1000 UT) CAPS [id number] Yes Take by mouth. [provider] Taking Active           Patient Active Problem List   Diagnosis Date Noted  . Pedal edema 09/23/2019  . Fibromyalgia 07/11/2019  . Class 3 severe obesity due to excess calories with serious comorbidity and body mass index (BMI) of 40.0 to 44.9 in adult ([place]) 07/11/2019  . Chronic right-sided low back pain without sciatica 06/21/2019  . 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 Chronic pain of both shoulders 06/21/2019  . Chronic pain of right knee 06/21/2019  . Dyslipidemia associated with type 2 diabetes mellitus ([place]) 06/13/2019  . Mixed hyperlipidemia 06/13/2019    Immunization History  Administered Date(s) Administered  . Fluad Quad(high Dose 65+) 01/01/2020  . Influenza-Unspecified 08/25/2017, 02/20/2018, 02/12/2019  . Pneumococcal Polysaccharide-23 01/01/2020    Conditions to be addressed/monitored:  Hypertension, Hyperlipidemia, Diabetes, Depression, Anxiety  and Osteoporosis  Care Plan : ccm pharmacy care plan  Updates made by Burnice Logan, RPH since 05/29/2020 12:00 AM    Problem: dm,hld,htn   Priority: High  Onset Date: 05/29/2020    Long-Range Goal: disease management   Start Date: 05/29/2020  Expected End Date: 05/29/2021  This Visit's Progress: On track  Priority: High  Note:    Current Barriers:  . Unable to independently afford treatment regimen  Pharmacist Clinical Goal(s):  Marland Kitchen Over the next 90 days, patient will maintain control of blood pressure, cholesterol and blood sugar as evidenced by lab results  through collaboration with PharmD and provider.    Interventions: . 1:1 collaboration with [name], MD regarding development and update of comprehensive plan of care as evidenced by provider attestation and co-signature . Inter-disciplinary care team collaboration (see longitudinal plan of care) . Comprehensive medication review performed; medication list updated in electronic medical record  Hypertension (BP goal <130/80) -controlled -Current treatment: . furosemide 20 mg daily  . Lisinopril 40 mg am and pm  -Medications previously tried: none reported -Current home readings: 130/70 -Current dietary habits: has very little appetite lately. States she is eating too late causing higher fasting blood sugars.  -Current exercise habits: physical therapy exercises with her husband and walking with a walker.  -Denies hypotensive/hypertensive symptoms -Educated on BP goals and benefits of medications for prevention of heart attack, stroke and kidney damage; Daily salt intake goal < 2300 mg; Exercise goal of 150 minutes per week; -Counseled to monitor BP at home daily, document, and provide log at future appointments -Counseled on diet and exercise extensively Recommended to continue current medication  Hyperlipidemia: (LDL goal < 70) -controlled -Current treatment: . rosuvastatin 10 mg daily  -Medications previously  tried: none reported  -Current dietary patterns: patient has limited appetite. Avoids cookies/cakes. Eating later in the evening before insulin.  -Current exercise habits: completing physical therapy exercises daily at home with her husband. Walking with walker at home.  -Educated on Cholesterol goals;  Benefits of statin for ASCVD risk reduction; Importance of limiting foods high in cholesterol; Exercise goal of 150 minutes per week; -Counseled on diet and exercise extensively Recommended to continue current medication  Diabetes (A1c goal <7%) -uncontrolled -Current medications: .  Trulicity 3 mg weekly  . Tresiba 65 units daily  -Medications previously tried: glimepiride -Current home glucose readings . fasting glucose: 150, 156, 134  today. Typically between 80-115 . post prandial glucose: not checking  -Denies hypoglycemic/hyperglycemic symptoms -Current meal patterns:  . breakfast: limited appetite . lunch: limited appetite . dinner: eating something at supper but too late and causing increased fasting blood sugar . snacks: denies cookies and cakes . drinks: water and coffee -Current exercise: working on physical therapy exercises at home.  -Educated onA1c and blood sugar goals; Exercise goal of 150 minutes per week; Prevention and management of hypoglycemic episodes; -Counseled to check feet daily and get yearly eye exams -Counseled on diet and exercise extensively Recommended to continue current medication   Fibromyalgia and Nerve Pain  (Goal: manage pain and symptoms) -controlled -Current treatment  . Amitriptyline 25 mg at bedtime . Pregabalin 300 mg daily  -Medications previously tried: none reported  -Recommended to continue current medication   Osteoporosis / Osteopenia (Goal get updated Dexa scan) -Last DEXA Scan: patient has not scheduled  -Current treatment  . Vitamin D 1000 daily  -Medications previously tried: none reported  -Recommend [phone] units of  vitamin D daily. Recommend 1200 mg of calcium daily from dietary and supplemental sources. Recommend weight-bearing and muscle strengthening exercises for building and maintaining bone density. -Counseled on diet and exercise extensively Recommended patient schedule Dexa scan  Health Maintenance -Vaccine gaps: Patient not interested in COVID Vaccine. Shingles and TDAP are recommended.  -Current therapy:  . acetaminophen 325 mg every 6 hours prn . Aspirin-acetaminophen-caffeine 2 tablets by mouth in the morning -Educated on COVID vaccine.  -Patient is satisfied with current therapy and denies issues -Recommended to continue current medication   Patient Goals/Self-Care Activities . Over the next 90 days, patient will:  - take medications as prescribed check glucose daily, document, and provide at future appointments check blood pressure daily, document, and provide at future appointments collaborate with provider on medication access solutions  Follow Up Plan: Telephone follow up appointment with care management team member scheduled for: 09/2020      Medication Assistance: Tyler Aas and Trulicity obtained through [place]  medication assistance program.  Enrollment ends 04/04/2021  Patient's preferred pharmacy is:  [facility] [place], [place] - [address] [place] [address] Phone: [phone] Fax: [phone]  [place], [place] - [address]. [place] [address] Phone: [phone] Fax: [phone]  Uses pill box? Yes Pt endorses good compliance  We discussed: Benefits of medication synchronization, packaging and delivery as well as enhanced pharmacist oversight with [facility]. Patient decided to: Utilize [facility] pharmacy for medication synchronization, packaging and delivery  Care Plan and Follow Up Patient Decision:  Patient agrees to Care Plan and  Follow-up.  Plan: Telephone follow up appointment with care management team member scheduled for:  09/2020

## 2020-06-02 ENCOUNTER — Other Ambulatory Visit: Payer: Self-pay

## 2020-06-02 MED ORDER — PREGABALIN 300 MG PO CAPS: 300.0000 mg | ORAL_CAPSULE | Freq: Every evening | ORAL | 1 refills | Status: DC

## 2020-06-03 ENCOUNTER — Other Ambulatory Visit: Payer: Self-pay

## 2020-06-03 MED ORDER — AMITRIPTYLINE HCL 25 MG PO TABS: 25.0000 mg | ORAL_TABLET | Freq: Every day | ORAL | 1 refills | Status: DC

## 2020-06-03 MED ORDER — MONTELUKAST SODIUM 10 MG PO TABS: ORAL_TABLET | ORAL | 1 refills | Status: DC

## 2020-06-03 MED ORDER — LISINOPRIL 40 MG PO TABS: ORAL_TABLET | ORAL | 1 refills | Status: DC

## 2020-06-05 ENCOUNTER — Other Ambulatory Visit: Payer: Self-pay

## 2020-06-10 ENCOUNTER — Other Ambulatory Visit: Payer: Self-pay | Admitting: Family Medicine

## 2020-06-16 ENCOUNTER — Telehealth: Payer: Self-pay

## 2020-06-16 NOTE — Progress Notes (Signed)
Chronic Care Management Pharmacy Assistant   Name: Christy Crawford  MRN: [id number] DOB: [birth year]   Reason for Encounter: Medication Review for [facility] delivery    Recent office visits:  05/29/20-Christy Crawford, [place]  Recent consult visits:  None  Hospital visits:  None in previous 6 months    Medications: Outpatient Encounter Medications as of 06/16/2020  Medication Sig  . acetaminophen (TYLENOL) 325 MG tablet Take 325 mg by mouth every 6 (six) hours as needed.  . ALPRAZolam (XANAX) 1 MG tablet Take 1 mg by mouth in the morning, at noon, in the evening, and at bedtime.   Marland Kitchen amitriptyline (ELAVIL) 25 MG tablet Take 1 tablet (25 mg total) by mouth at bedtime.  Marland Kitchen aspirin-acetaminophen-caffeine (EXCEDRIN MIGRAINE) 250-250-65 MG tablet Take 2 tablets by mouth in the morning.  [name] Choice Lancets 28G MISC E11.4 Use new lancet each time when checking blood sugar  . COMFORT EZ PEN NEEDLES 31G X 5 MM MISC E11.44 use new needle with each new injection  . Dulaglutide (TRULICITY) 3 KD/9.8PJ SOPN Inject 0.5 mLs (3 mg total) as directed once a week.  . furosemide (LASIX) 20 MG tablet TAKE ONE TABLET BY MOUTH ONCE DAILY  . glucose blood (CLEVER CHOICE MICRO TEST) test strip E11.44 Use new strip each time when checking blood sugar. Check sugar 1-3 times daily.  . insulin degludec (TRESIBA FLEXTOUCH) 100 UNIT/ML FlexTouch Pen Inject 65 Units into the skin daily.  Marland Kitchen lisinopril (ZESTRIL) 40 MG tablet TAKE 1 TABLET BY MOUTH EVERY MORNING AND1 TABLET BY MOUTH AT BEDTIME  . montelukast (SINGULAIR) 10 MG tablet TAKE ONE TABLET BY MOUTH EVERYDAY AT BEDTIME  . Multiple Vitamins-Minerals (CENTRUM SILVER PO) Take 1 tablet by mouth daily.  Marland Kitchen oxybutynin (DITROPAN) 5 MG tablet Take 1 tablet (5 mg total) by mouth 3 (three) times daily.  . pregabalin (LYRICA) 300 MG capsule Take 1 capsule (300 mg total) by mouth every evening.  . rosuvastatin (CRESTOR) 10 MG tablet TAKE ONE TABLET BY MOUTH ONCE DAILY. Due FOR  FOLLOWING UP office visits AND labs. No refills UNTIL seen  . sertraline (ZOLOFT) 100 MG tablet Take 1 tablet (100 mg total) by mouth 2 (two) times daily.  . Vitamin D, Cholecalciferol, 25 MCG (1000 UT) CAPS Take by mouth.   No facility-administered encounter medications on file as of 06/16/2020.    Reviewed chart for medication changes ahead of medication coordination call.  No OVs, Consults, or hospital visits since last care coordination call/Pharmacist visit. (If appropriate, list visit date, provider name)  No medication changes indicated OR if recent visit, treatment plan here.  BP Readings from Last 3 Encounters:  05/12/20 120/70  01/01/20 (!) 142/78  09/19/19 (!) 142/76    Lab Results  Component Value Date   HGBA1C 7.3 (H) 05/12/2020     Patient obtains medications through Vials  30 Days   Last adherence delivery included: Acute fill Sertraline, Alprazolam and Tresiba   Patient is due for next adherence delivery on: 06/18/20 for acute fill . Called patient and reviewed medications and coordinated delivery.  This delivery to include: Lisinopril  (per patients daughter)    Patient needs refills for none  Confirmed delivery date of 06/18/20, advised patient that pharmacy will contact them the morning of delivery.  I have filled out acute form for Lisinopril and submitted it to [facility]   Patient is extremely hard to contact.  I was able to each her daughter today by text, and she  noted this was the only medication her mom needed to date.   Christy Crawford, [place] Pharmacist Assistant [phone]

## 2020-06-17 ENCOUNTER — Telehealth: Payer: Self-pay

## 2020-06-17 NOTE — Telephone Encounter (Signed)
      Left message for patient to return call.  Christy Crawford is due for a screening Mammogram, DEXA, and colorectal cancer screening.  Shelle Iron, LPN 90/24/09 73:53 PM

## 2020-06-23 ENCOUNTER — Telehealth: Payer: Self-pay

## 2020-06-23 NOTE — Telephone Encounter (Signed)
Patient assistance for [place] received today. Placed in the fridge, patient will be contacted.

## 2020-07-02 ENCOUNTER — Telehealth: Payer: Self-pay

## 2020-07-02 NOTE — Progress Notes (Signed)
    Chronic Care Management Pharmacy Assistant   Name: ALENNA RUSSELL  MRN: [id number] DOB: 1955/10/16   Reason for Encounter: Medication Review for 90ds packaging    Medications: Outpatient Encounter Medications as of 07/02/2020  Medication Sig  . acetaminophen (TYLENOL) 325 MG tablet Take 325 mg by mouth every 6 (six) hours as needed.  . ALPRAZolam (XANAX) 1 MG tablet Take 1 mg by mouth in the morning, at noon, in the evening, and at bedtime.   Marland Kitchen amitriptyline (ELAVIL) 25 MG tablet Take 1 tablet (25 mg total) by mouth at bedtime.  Marland Kitchen aspirin-acetaminophen-caffeine (EXCEDRIN MIGRAINE) 250-250-65 MG tablet Take 2 tablets by mouth in the morning.  [name] Choice Lancets 28G MISC E11.4 Use new lancet each time when checking blood sugar  . COMFORT EZ PEN NEEDLES 31G X 5 MM MISC E11.44 use new needle with each new injection  . Dulaglutide (TRULICITY) 3 BZ/1.6RC SOPN Inject 0.5 mLs (3 mg total) as directed once a week.  . furosemide (LASIX) 20 MG tablet TAKE ONE TABLET BY MOUTH ONCE DAILY  . glucose blood (CLEVER CHOICE MICRO TEST) test strip E11.44 Use new strip each time when checking blood sugar. Check sugar 1-3 times daily.  . insulin degludec (TRESIBA FLEXTOUCH) 100 UNIT/ML FlexTouch Pen Inject 65 Units into the skin daily.  Marland Kitchen lisinopril (ZESTRIL) 40 MG tablet TAKE 1 TABLET BY MOUTH EVERY MORNING AND1 TABLET BY MOUTH AT BEDTIME  . montelukast (SINGULAIR) 10 MG tablet TAKE ONE TABLET BY MOUTH EVERYDAY AT BEDTIME  . Multiple Vitamins-Minerals (CENTRUM SILVER PO) Take 1 tablet by mouth daily.  Marland Kitchen oxybutynin (DITROPAN) 5 MG tablet Take 1 tablet (5 mg total) by mouth 3 (three) times daily.  . pregabalin (LYRICA) 300 MG capsule Take 1 capsule (300 mg total) by mouth every evening.  . rosuvastatin (CRESTOR) 10 MG tablet TAKE ONE TABLET BY MOUTH ONCE DAILY. Due FOR FOLLOWING UP office visits AND labs. No refills UNTIL seen  . sertraline (ZOLOFT) 100 MG tablet Take 1 tablet (100 mg total) by mouth 2  (two) times daily.  . Vitamin D, Cholecalciferol, 25 MCG (1000 UT) CAPS Take by mouth.   No facility-administered encounter medications on file as of 07/02/2020.    [facility] pharmacy called and noted the patients husband was requesting a call so that they could transition into 90ds with packs.  I spoke to the patients husband and he stated she was going to be out of her Lisinopril and Montelukast by Saturday.  I have completed an acute fill form form 07/03/20, and sent to [facility] for processing.  Patients husband stated they would like to have 90ds of her medications but want to stay with bottles not packs.  I told him I would be glad to assist him with this so it would be easier to manage her medications and to receive them all at one time.  He was happy to hear this option was possible.  I spoke to Donette Larry, CPP, to cut down on confusion we decided to complete a new onboarding form.  I have completed the form with with sync date of 07/13/20, and sent it to Donette Larry, [place], [place] Pharmacist Assistant [phone]

## 2020-07-02 NOTE — Telephone Encounter (Signed)
Pt called left VM stating she would like a change in oxybutynin as it is no longer helping with incontinence. Please advise.   Royce Macadamia, [place] 07/02/20 1:20 PM

## 2020-07-07 ENCOUNTER — Other Ambulatory Visit: Payer: Self-pay | Admitting: Family Medicine

## 2020-07-07 NOTE — Telephone Encounter (Signed)
Increase oxybutynin to 5 mg 2 three times a day for bladder incontinence. kc

## 2020-07-10 ENCOUNTER — Telehealth: Payer: Self-pay

## 2020-07-10 NOTE — Progress Notes (Signed)
    Chronic Care Management Pharmacy Assistant   Name: MARLEAN MORTELL  MRN: [id number] DOB: 1956/03/27   Reason for Encounter: Medication coordination for [facility] deliver (acute)    Medications: Outpatient Encounter Medications as of 07/10/2020  Medication Sig  . acetaminophen (TYLENOL) 325 MG tablet Take 325 mg by mouth every 6 (six) hours as needed.  . ALPRAZolam (XANAX) 1 MG tablet Take 1 mg by mouth in the morning, at noon, in the evening, and at bedtime.   Marland Kitchen amitriptyline (ELAVIL) 25 MG tablet Take 1 tablet (25 mg total) by mouth at bedtime.  Marland Kitchen aspirin-acetaminophen-caffeine (EXCEDRIN MIGRAINE) 250-250-65 MG tablet Take 2 tablets by mouth in the morning.  [name] Choice Lancets 28G MISC E11.4 Use new lancet each time when checking blood sugar  . COMFORT EZ PEN NEEDLES 31G X 5 MM MISC E11.44 use new needle with each new injection  . Dulaglutide (TRULICITY) 3 BC/4.8GQ SOPN Inject 0.5 mLs (3 mg total) as directed once a week.  . furosemide (LASIX) 20 MG tablet TAKE ONE TABLET BY MOUTH ONCE DAILY  . glucose blood (CLEVER CHOICE MICRO TEST) test strip E11.44 Use new strip each time when checking blood sugar. Check sugar 1-3 times daily.  . insulin degludec (TRESIBA FLEXTOUCH) 100 UNIT/ML FlexTouch Pen Inject 65 Units into the skin daily.  Marland Kitchen lisinopril (ZESTRIL) 40 MG tablet TAKE 1 TABLET BY MOUTH EVERY MORNING AND1 TABLET BY MOUTH AT BEDTIME  . montelukast (SINGULAIR) 10 MG tablet TAKE ONE TABLET BY MOUTH EVERYDAY AT BEDTIME  . Multiple Vitamins-Minerals (CENTRUM SILVER PO) Take 1 tablet by mouth daily.  Marland Kitchen oxybutynin (DITROPAN) 5 MG tablet Take 1 tablet (5 mg total) by mouth 3 (three) times daily.  . pregabalin (LYRICA) 300 MG capsule Take 1 capsule (300 mg total) by mouth every evening.  . rosuvastatin (CRESTOR) 10 MG tablet TAKE ONE TABLET BY MOUTH ONCE DAILY. Due FOR FOLLOWING UP office visits AND labs. No refills UNTIL seen  . sertraline (ZOLOFT) 100 MG tablet Take 1 tablet (100 mg  total) by mouth 2 (two) times daily.  . Vitamin D, Cholecalciferol, 25 MCG (1000 UT) CAPS Take by mouth.   No facility-administered encounter medications on file as of 07/10/2020.    Patent husband called he stated the patient was going to need he following medications by 07/11/20, she would be out of her Alprazolam on 07/14/20  Alprazolam 32G needles Sertraline Tresiba  I have requested refills from Donette Larry, CPP  I have filled out the necessary acute fill form and have sent it to [facility] for delivery.   Clarita Leber, [place] Pharmacist Assistant [phone]

## 2020-07-14 ENCOUNTER — Telehealth: Payer: Self-pay

## 2020-07-14 NOTE — Progress Notes (Signed)
    Chronic Care Management Pharmacy Assistant   Name: Christy Crawford  MRN: [id number] DOB: 07/24/1955   Reason for Encounter: Medication Review for Tresiba    Medications: Outpatient Encounter Medications as of 07/14/2020  Medication Sig  . acetaminophen (TYLENOL) 325 MG tablet Take 325 mg by mouth every 6 (six) hours as needed.  . ALPRAZolam (XANAX) 1 MG tablet Take 1 mg by mouth in the morning, at noon, in the evening, and at bedtime.   Marland Kitchen amitriptyline (ELAVIL) 25 MG tablet Take 1 tablet (25 mg total) by mouth at bedtime.  Marland Kitchen aspirin-acetaminophen-caffeine (EXCEDRIN MIGRAINE) 250-250-65 MG tablet Take 2 tablets by mouth in the morning.  [name] Choice Lancets 28G MISC E11.4 Use new lancet each time when checking blood sugar  . COMFORT EZ PEN NEEDLES 31G X 5 MM MISC E11.44 use new needle with each new injection  . Dulaglutide (TRULICITY) 3 OZ/3.6UY SOPN Inject 0.5 mLs (3 mg total) as directed once a week.  . furosemide (LASIX) 20 MG tablet TAKE ONE TABLET BY MOUTH ONCE DAILY  . glucose blood (CLEVER CHOICE MICRO TEST) test strip E11.44 Use new strip each time when checking blood sugar. Check sugar 1-3 times daily.  . insulin degludec (TRESIBA FLEXTOUCH) 100 UNIT/ML FlexTouch Pen Inject 65 Units into the skin daily.  Marland Kitchen lisinopril (ZESTRIL) 40 MG tablet TAKE 1 TABLET BY MOUTH EVERY MORNING AND1 TABLET BY MOUTH AT BEDTIME  . montelukast (SINGULAIR) 10 MG tablet TAKE ONE TABLET BY MOUTH EVERYDAY AT BEDTIME  . Multiple Vitamins-Minerals (CENTRUM SILVER PO) Take 1 tablet by mouth daily.  Marland Kitchen oxybutynin (DITROPAN) 5 MG tablet Take 1 tablet (5 mg total) by mouth 3 (three) times daily.  . pregabalin (LYRICA) 300 MG capsule Take 1 capsule (300 mg total) by mouth every evening.  . rosuvastatin (CRESTOR) 10 MG tablet TAKE ONE TABLET BY MOUTH ONCE DAILY. Due FOR FOLLOWING UP office visits AND labs. No refills UNTIL seen  . sertraline (ZOLOFT) 100 MG tablet Take 1 tablet (100 mg total) by mouth 2 (two)  times daily.  . Vitamin D, Cholecalciferol, 25 MCG (1000 UT) CAPS Take by mouth.   No facility-administered encounter medications on file as of 07/14/2020.    Patient husband, Christy Crawford called to make sure she still had tresiba at the office ready to be picked up.  He stated they got a delivery for it from [facility] on Saturday.  I called the office and she does have needles and tresiba at the office that she gets with patient assistance.  I called the Christy Crawford back to let him know she has medication at the office, I apologized for the mistake, he was very kind and said it was fine, it was not the end of the world.  I assured him it would come from patient assistance going forward at no cost to them.    Spoke to Christy Crawford with [facility] they will pick up the medication and refund the patients money.  I have let Christy Crawford know.    Clarita Leber, [place] Pharmacist Assistant [phone]

## 2020-08-04 ENCOUNTER — Telehealth: Payer: Self-pay

## 2020-08-04 NOTE — Progress Notes (Signed)
Spoke to Christy Crawford and let him know that Dr. Tobie Poet wants to schedule an appointment to discuss incontinence issues.  They will call to make an appointment.     Clarita Leber, [place] Pharmacist Assistant [phone]

## 2020-08-04 NOTE — Progress Notes (Signed)
    Chronic Care Management Pharmacy Assistant   Name: Christy Crawford  MRN: [id number] DOB: 26-Apr-1955  Reason for Encounter: Medication coordination for acute delivery of Alprazolam     Medications: Outpatient Encounter Medications as of 08/04/2020  Medication Sig  . acetaminophen (TYLENOL) 325 MG tablet Take 325 mg by mouth every 6 (six) hours as needed.  . ALPRAZolam (XANAX) 1 MG tablet Take 1 mg by mouth in the morning, at noon, in the evening, and at bedtime.   Marland Kitchen amitriptyline (ELAVIL) 25 MG tablet Take 1 tablet (25 mg total) by mouth at bedtime.  Marland Kitchen aspirin-acetaminophen-caffeine (EXCEDRIN MIGRAINE) 250-250-65 MG tablet Take 2 tablets by mouth in the morning.  Christy Crawford Choice Lancets 28G MISC E11.4 Use new lancet each time when checking blood sugar  . COMFORT EZ PEN NEEDLES 31G X 5 MM MISC E11.44 use new needle with each new injection  . Dulaglutide (TRULICITY) 3 QP/5.9FM SOPN Inject 0.5 mLs (3 mg total) as directed once a week.  . furosemide (LASIX) 20 MG tablet TAKE ONE TABLET BY MOUTH ONCE DAILY  . glucose blood (CLEVER CHOICE MICRO TEST) test strip E11.44 Use new strip each time when checking blood sugar. Check sugar 1-3 times daily.  . insulin degludec (TRESIBA FLEXTOUCH) 100 UNIT/ML FlexTouch Pen Inject 65 Units into the skin daily.  Marland Kitchen lisinopril (ZESTRIL) 40 MG tablet TAKE 1 TABLET BY MOUTH EVERY MORNING AND1 TABLET BY MOUTH AT BEDTIME  . montelukast (SINGULAIR) 10 MG tablet TAKE ONE TABLET BY MOUTH EVERYDAY AT BEDTIME  . Multiple Vitamins-Minerals (CENTRUM SILVER PO) Take 1 tablet by mouth daily.  Marland Kitchen oxybutynin (DITROPAN) 5 MG tablet Take 1 tablet (5 mg total) by mouth 3 (three) times daily.  . pregabalin (LYRICA) 300 MG capsule Take 1 capsule (300 mg total) by mouth every evening.  . rosuvastatin (CRESTOR) 10 MG tablet TAKE ONE TABLET BY MOUTH ONCE DAILY. Due FOR FOLLOWING UP office visits AND labs. No refills UNTIL seen  . sertraline (ZOLOFT) 100 MG tablet Take 1 tablet (100 mg  total) by mouth 2 (two) times daily.  . Vitamin D, Cholecalciferol, 25 MCG (1000 UT) CAPS Take by mouth.   No facility-administered encounter medications on file as of 08/04/2020.   Patient husband called about acute refill for Alprazolam.  He also stated the patient needs to have her Oxybutynin changed, it does not seem to be helping her.  He said she stands to her walker and has no control.   He denied any UTI symptoms.  I have made Christy Crawford, CPP aware of the issue.   I have put in acute fill form for 08/08/20 for Alprazolam.  I have noted [facility] will need to fax request to doctor for refill as it comes from specialist.  Christy Crawford, [place] Pharmacist Assistant [phone]

## 2020-08-05 ENCOUNTER — Ambulatory Visit: Payer: Medicare HMO | Admitting: Family Medicine

## 2020-08-18 ENCOUNTER — Encounter: Payer: Self-pay | Admitting: Family Medicine

## 2020-08-18 ENCOUNTER — Ambulatory Visit (INDEPENDENT_AMBULATORY_CARE_PROVIDER_SITE_OTHER): Payer: Medicare HMO | Admitting: Family Medicine

## 2020-08-18 ENCOUNTER — Other Ambulatory Visit: Payer: Self-pay

## 2020-08-18 VITALS — BP 138/84 | HR 88 | Temp 97.2°F | Resp 18 | Ht 72.0 in | Wt 310.0 lb

## 2020-08-18 DIAGNOSIS — E782 Mixed hyperlipidemia: Secondary | ICD-10-CM

## 2020-08-18 DIAGNOSIS — N3000 Acute cystitis without hematuria: Secondary | ICD-10-CM | POA: Diagnosis not present

## 2020-08-18 DIAGNOSIS — Z6841 Body Mass Index (BMI) 40.0 and over, adult: Secondary | ICD-10-CM

## 2020-08-18 DIAGNOSIS — R1310 Dysphagia, unspecified: Secondary | ICD-10-CM

## 2020-08-18 DIAGNOSIS — N3941 Urge incontinence: Secondary | ICD-10-CM | POA: Diagnosis not present

## 2020-08-18 DIAGNOSIS — I10 Essential (primary) hypertension: Secondary | ICD-10-CM | POA: Diagnosis not present

## 2020-08-18 DIAGNOSIS — M797 Fibromyalgia: Secondary | ICD-10-CM

## 2020-08-18 DIAGNOSIS — I739 Peripheral vascular disease, unspecified: Secondary | ICD-10-CM | POA: Diagnosis not present

## 2020-08-18 DIAGNOSIS — E1144 Type 2 diabetes mellitus with diabetic amyotrophy: Secondary | ICD-10-CM | POA: Diagnosis not present

## 2020-08-18 DIAGNOSIS — R600 Localized edema: Secondary | ICD-10-CM

## 2020-08-18 DIAGNOSIS — R6889 Other general symptoms and signs: Secondary | ICD-10-CM | POA: Diagnosis not present

## 2020-08-18 LAB — POCT URINALYSIS DIPSTICK
Bilirubin, UA: NEGATIVE
Blood, UA: NEGATIVE
Glucose, UA: NEGATIVE
Ketones, UA: NEGATIVE
Nitrite, UA: NEGATIVE
Protein, UA: NEGATIVE
Spec Grav, UA: 1.015 (ref 1.010–1.025)
Urobilinogen, UA: 0.2 E.U./dL
pH, UA: 6 (ref 5.0–8.0)

## 2020-08-18 MED ORDER — GEMTESA 75 MG PO TABS: 75.0000 mg | ORAL_TABLET | Freq: Every day | ORAL | 2 refills | Status: DC

## 2020-08-18 MED ORDER — NITROFURANTOIN MONOHYD MACRO 100 MG PO CAPS: 100.0000 mg | ORAL_CAPSULE | Freq: Two times a day (BID) | ORAL | 0 refills | Status: DC

## 2020-08-18 NOTE — Patient Instructions (Addendum)
Start on gemtisa 75 mg once daily.  Stop oxybutynin.  Ordering swallowing study

## 2020-08-18 NOTE — Progress Notes (Addendum)
Subjective:  Patient ID: Christy Crawford, female    DOB: 1956-02-17  Age: 65 y.o. MRN: [id number]  Chief Complaint  Patient presents with  . Diabetes  . 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  Not taking [place].  On lyrica 300 mg one twice a day. Checks FBS 3 times daily ranging from 70-159 in the morning, 120's at lunch and 90's at bedtime. Tries to eat healthy. Weakness of BL legs.   Mixed hyperlipidemia Taking crestor daily, eats healthy  Essential hypertension Lasix 20 mg once daily for swelling. On lisinopril 40 mg one twice a day.   Fibromyalgia Lyrica 300 mg one twice a day. On amitriptyline 25 mg once daily  Urinary Incontinence-Started a month ago. NO abdominal pain, urgency or urinary frequency. Has been taking oxybutynin.  Dysphagia- difficulty swallowing x 2 months.   Current Outpatient Medications on File Prior to Visit  Medication Sig Dispense Refill  . acetaminophen (TYLENOL) 325 MG tablet Take 325 mg by mouth every 6 (six) hours as needed.    . ALPRAZolam (XANAX) 1 MG tablet Take 1 mg by mouth in the morning, at noon, in the evening, and at bedtime.     Marland Kitchen amitriptyline (ELAVIL) 25 MG tablet Take 1 tablet (25 mg total) by mouth at bedtime. 90 tablet 1  . aspirin-acetaminophen-caffeine (EXCEDRIN MIGRAINE) 250-250-65 MG tablet Take 2 tablets by mouth in the morning.    . Dulaglutide (TRULICITY) 3 WU/9.8JX SOPN Inject 0.5 mLs (3 mg total) as directed once a week. 3 mL 2  . furosemide (LASIX) 20 MG tablet TAKE ONE TABLET BY MOUTH ONCE DAILY 90 tablet 0  . insulin degludec (TRESIBA FLEXTOUCH) 100 UNIT/ML FlexTouch Pen Inject 65 Units into the skin daily. 60 mL 0  . lisinopril (ZESTRIL) 40 MG tablet TAKE 1 TABLET BY MOUTH EVERY MORNING AND1 TABLET BY MOUTH AT BEDTIME 180 tablet 1  . montelukast (SINGULAIR) 10 MG tablet TAKE ONE TABLET BY MOUTH EVERYDAY AT BEDTIME 90 tablet 1  .  Multiple Vitamins-Minerals (CENTRUM SILVER PO) Take 1 tablet by mouth daily.    . pregabalin (LYRICA) 300 MG capsule Take 1 capsule (300 mg total) by mouth every evening. 180 capsule 1  . rosuvastatin (CRESTOR) 10 MG tablet TAKE ONE TABLET BY MOUTH ONCE DAILY. Due FOR FOLLOWING UP office visits AND labs. No refills UNTIL seen 90 tablet 1  . sertraline (ZOLOFT) 100 MG tablet Take 1 tablet (100 mg total) by mouth 2 (two) times daily. 180 tablet 1  . Clever Choice Lancets 28G MISC E11.4 Use new lancet each time when checking blood sugar 100 each 2  . COMFORT EZ PEN NEEDLES 31G X 5 MM MISC E11.44 use new needle with each new injection 100 each 2  . glucose blood (CLEVER CHOICE MICRO TEST) test strip E11.44 Use new strip each time when checking blood sugar. Check sugar 1-3 times daily. 100 each 2  . Vitamin D, Cholecalciferol, 25 MCG (1000 UT) CAPS Take by mouth.     No current facility-administered medications on file prior to visit.   Past Medical History:  Diagnosis Date  . Chronic ulcer of right calf ([place])   . Diabetes mellitus without complication ([place])   . 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 Essential hypertension   . Fibromyalgia   . Generalized edema   . Hyperlipidemia   . Hypertension   .  Major depression   . Migraine headache   . Mixed hyperlipidemia   . Sleep apnea   . Telogen effluvium    Past Surgical History:  Procedure Laterality Date  . CHOLECYSTECTOMY    . lens implants Bilateral     Family History  Adopted: Yes  Problem Relation Age of Onset  . Diabetes Maternal Aunt    Social History   Socioeconomic History  . Marital status: Married    Spouse name: Not on file  . Number of children: 1  . Years of education: Not on file  . Highest education level: Not on file  Occupational History  . Occupation: disabled  Tobacco Use  . Smoking status: Former Smoker    Types: Cigarettes    Quit  date: 2000    Years since quitting: 22.3  . Smokeless tobacco: Never Used  Substance and Sexual Activity  . Alcohol use: Yes    Alcohol/week: 1.0 standard drink    Types: 1 Glasses of wine per week    Comment: socially  . Drug use: Never  . Sexual activity: Not on file  Other Topics Concern  . Not on file  Social History Narrative  . Not on file   Social Determinants of Health   Financial Resource Strain: Not on file  Food Insecurity: No Food Insecurity  . Worried About Charity fundraiser in the Last Year: Never true  . Ran Out of Food in the Last Year: Never true  Transportation Needs: No Transportation Needs  . Lack of Transportation (Medical): No  . Lack of Transportation (Non-Medical): No  Physical Activity: Not on file  Stress: Not on file  Social Connections: Not on file    Review of Systems  Constitutional: Negative for chills, fatigue and fever.  HENT: Negative for congestion, ear pain, rhinorrhea and sore throat.   Respiratory: Negative for cough and shortness of breath.   Cardiovascular: Negative for chest pain.  Gastrointestinal: Negative for abdominal pain, constipation, diarrhea, nausea and vomiting.       Difficulty swallowing  Genitourinary: Negative for dysuria and urgency.       Urinary incontinence  Musculoskeletal: Negative for back pain and myalgias.  Neurological: Negative for dizziness, weakness, light-headedness and headaches.  Psychiatric/Behavioral: Negative for dysphoric mood. The patient is not nervous/anxious.      Objective:  BP 138/84   Pulse 88   Temp (!) 97.2 F (36.2 C)   Resp 18   Ht 6' (1.829 m)   Wt (!) 310 lb (140.6 kg)   BMI 42.04 kg/m   BP/Weight 08/18/2020 05/12/2020 09/09/3014  Systolic BP 010 932 355  Diastolic BP 84 70 78  Wt. (Lbs) 310 - 318  BMI 42.04 43.13 43.13    Physical Exam Vitals reviewed.  Constitutional:      Appearance: Normal appearance. She is normal weight.  Neck:     Vascular: No carotid bruit.   Cardiovascular:     Rate and Rhythm: Normal rate and regular rhythm.     Pulses: Normal pulses.     Heart sounds: Normal heart sounds.  Pulmonary:     Effort: Pulmonary effort is normal. No respiratory distress.     Breath sounds: Normal breath sounds.  Abdominal:     General: Abdomen is flat. Bowel sounds are normal.     Palpations: Abdomen is soft.     Tenderness: There is no abdominal tenderness.  Neurological:     Mental Status: She is alert and oriented to  person, place, and time.  Psychiatric:        Mood and Affect: Mood normal.        Behavior: Behavior normal.     Diabetic Foot Exam - Simple   Simple Foot Form Diabetic Foot exam was performed with the following findings: Yes 08/18/2020  3:17 PM  Visual Inspection No deformities, no ulcerations, no other skin breakdown bilaterally: Yes Sensation Testing Intact to touch and monofilament testing bilaterally: Yes Pulse Check Posterior Tibialis and Dorsalis pulse intact bilaterally: Yes Comments      Lab Results  Component Value Date   WBC 7.6 05/12/2020   HGB 14.4 05/12/2020   HCT 43.3 05/12/2020   PLT 219 05/12/2020   GLUCOSE 150 (H) 05/12/2020   CHOL 144 05/12/2020   TRIG 151 (H) 05/12/2020   HDL 53 05/12/2020   LDLCALC 65 05/12/2020   ALT 18 05/12/2020   AST 28 05/12/2020   NA 140 05/12/2020   K 4.8 05/12/2020   CL 100 05/12/2020   CREATININE 0.99 05/12/2020   BUN 16 05/12/2020   CO2 27 05/12/2020   TSH 4.060 06/13/2019   HGBA1C 7.3 (H) 05/12/2020      Assessment & Plan:   1. 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  Recommend check feet daily. Recommend annual eye exams. Medicines: no changes Continue to work on eating a healthy diet and exercise.  Labs drawn today.   - Hemoglobin A1c  2. Mixed hyperlipidemia Well controlled.  No changes to medicines.  Continue to work on eating a healthy diet and  exercise.  Labs drawn today.  - Lipid panel  3. Essential hypertension Well controlled.  No changes to medicines.  Continue to work on eating a healthy diet and exercise.  Labs drawn today.  - Comprehensive metabolic panel - CBC with Differential/Platelet  4. Fibromyalgia The current medical regimen is effective;  continue present plan and medications.  5. Dysphagia, unspecified type - SLP modified barium swallow  6. Urge incontinence of urine Start on Gemtesa 75 mg once daily.  Stop oxybutynin. Not helping.  - Vibegron (GEMTESA) 75 MG TABS; Take 75 mg by mouth daily.  Dispense: 30 tablet; Refill: 2 - Urine Culture - POCT urinalysis dipstick  7. Acute cystitis without hematuria Urine culture.  Start on macrobid.  8. Class 3 severe obesity due to excess calories with serious comorbidity and body mass index (BMI) of 40.0 to 44.9 in adult [facility]) Recommend continue to work on eating healthy diet and exercise.  9. PAD (peripheral artery disease) ([place]) The current medical regimen is effective;  continue present plan and medications.  10. Pedal edema  Continue lasix.   Meds ordered this encounter  Medications  . Vibegron (GEMTESA) 75 MG TABS    Sig: Take 75 mg by mouth daily.    Dispense:  30 tablet    Refill:  2  . nitrofurantoin, macrocrystal-monohydrate, (MACROBID) 100 MG capsule    Sig: Take 1 capsule (100 mg total) by mouth 2 (two) times daily.    Dispense:  14 capsule    Refill:  0    Orders Placed This Encounter  Procedures  . Urine Culture  . Lipid panel  . Hemoglobin A1c  . Comprehensive metabolic panel  . CBC with Differential/Platelet  . SLP modified barium swallow  . POCT urinalysis dipstick     Follow-up: Return in about 3 months (around 11/18/2020) for fasting.  An  After Visit Summary was printed and given to the patient.  [name], MD [facility] [phone]

## 2020-08-19 LAB — LIPID PANEL
Chol/HDL Ratio: 3 ratio (ref 0.0–4.4)
Cholesterol, Total: 154 mg/dL (ref 100–199)
HDL: 52 mg/dL (ref >39)
LDL Chol Calc (NIH): 76 mg/dL (ref 0–99)
Triglycerides: 148 mg/dL (ref 0–149)
VLDL Cholesterol Cal: 26 mg/dL (ref 5–40)

## 2020-08-19 LAB — COMPREHENSIVE METABOLIC PANEL
ALT: 22 IU/L (ref 0–32)
AST: 37 IU/L (ref 0–40)
Albumin/Globulin Ratio: 2 (ref 1.2–2.2)
Albumin: 4.4 g/dL (ref 3.8–4.8)
Alkaline Phosphatase: 104 IU/L (ref 44–121)
BUN/Creatinine Ratio: 14 (ref 12–28)
BUN: 13 mg/dL (ref 8–27)
Bilirubin Total: 0.3 mg/dL (ref 0.0–1.2)
CO2: 24 mmol/L (ref 20–29)
Calcium: 9.5 mg/dL (ref 8.7–10.3)
Chloride: 103 mmol/L (ref 96–106)
Creatinine, Ser: 0.91 mg/dL (ref 0.57–1.00)
Globulin, Total: 2.2 g/dL (ref 1.5–4.5)
Glucose: 65 mg/dL (ref 65–99)
Potassium: 4.6 mmol/L (ref 3.5–5.2)
Sodium: 144 mmol/L (ref 134–144)
Total Protein: 6.6 g/dL (ref 6.0–8.5)
eGFR: 70 mL/min/{1.73_m2} (ref >59)

## 2020-08-19 LAB — CBC WITH DIFFERENTIAL/PLATELET
Basophils Absolute: 0.1 10*3/uL (ref 0.0–0.2)
Basos: 1 %
EOS (ABSOLUTE): 0.1 10*3/uL (ref 0.0–0.4)
Eos: 2 %
Hematocrit: 43.2 % (ref 34.0–46.6)
Hemoglobin: 14.4 g/dL (ref 11.1–15.9)
Immature Grans (Abs): 0.1 10*3/uL (ref 0.0–0.1)
Immature Granulocytes: 1 %
Lymphocytes Absolute: 1.4 10*3/uL (ref 0.7–3.1)
Lymphs: 20 %
MCH: 28.5 pg (ref 26.6–33.0)
MCHC: 33.3 g/dL (ref 31.5–35.7)
MCV: 86 fL (ref 79–97)
Monocytes Absolute: 0.4 10*3/uL (ref 0.1–0.9)
Monocytes: 6 %
Neutrophils Absolute: 4.9 10*3/uL (ref 1.4–7.0)
Neutrophils: 70 %
Platelets: 190 10*3/uL (ref 150–450)
RBC: 5.05 x10E6/uL (ref 3.77–5.28)
RDW: 15.3 % (ref 11.7–15.4)
WBC: 7 10*3/uL (ref 3.4–10.8)

## 2020-08-19 LAB — HEMOGLOBIN A1C
Est. average glucose Bld gHb Est-mCnc: 174 mg/dL
Hgb A1c MFr Bld: 7.7 % — ABNORMAL HIGH (ref 4.8–5.6)

## 2020-08-19 LAB — CARDIOVASCULAR RISK ASSESSMENT

## 2020-08-21 LAB — URINE CULTURE

## 2020-08-25 ENCOUNTER — Telehealth: Payer: Self-pay

## 2020-08-25 NOTE — Progress Notes (Signed)
    Chronic Care Management Pharmacy Assistant   Name: Christy Crawford  MRN: [id number] DOB: 1955-04-29   Reason for Encounter: Medication Review for TRULICITY increase to 4.5mg /weekly    Medications: Outpatient Encounter Medications as of 08/25/2020  Medication Sig  . acetaminophen (TYLENOL) 325 MG tablet Take 325 mg by mouth every 6 (six) hours as needed.  . ALPRAZolam (XANAX) 1 MG tablet Take 1 mg by mouth in the morning, at noon, in the evening, and at bedtime.   Marland Kitchen amitriptyline (ELAVIL) 25 MG tablet Take 1 tablet (25 mg total) by mouth at bedtime.  Marland Kitchen aspirin-acetaminophen-caffeine (EXCEDRIN MIGRAINE) 250-250-65 MG tablet Take 2 tablets by mouth in the morning.  [name] Choice Lancets 28G MISC E11.4 Use new lancet each time when checking blood sugar  . COMFORT EZ PEN NEEDLES 31G X 5 MM MISC E11.44 use new needle with each new injection  . Dulaglutide (TRULICITY) 3 DU/2.0UR SOPN Inject 0.5 mLs (3 mg total) as directed once a week.  . furosemide (LASIX) 20 MG tablet TAKE ONE TABLET BY MOUTH ONCE DAILY  . glucose blood (CLEVER CHOICE MICRO TEST) test strip E11.44 Use new strip each time when checking blood sugar. Check sugar 1-3 times daily.  . insulin degludec (TRESIBA FLEXTOUCH) 100 UNIT/ML FlexTouch Pen Inject 65 Units into the skin daily.  Marland Kitchen lisinopril (ZESTRIL) 40 MG tablet TAKE 1 TABLET BY MOUTH EVERY MORNING AND1 TABLET BY MOUTH AT BEDTIME  . montelukast (SINGULAIR) 10 MG tablet TAKE ONE TABLET BY MOUTH EVERYDAY AT BEDTIME  . Multiple Vitamins-Minerals (CENTRUM SILVER PO) Take 1 tablet by mouth daily.  . nitrofurantoin, macrocrystal-monohydrate, (MACROBID) 100 MG capsule Take 1 capsule (100 mg total) by mouth 2 (two) times daily.  . pregabalin (LYRICA) 300 MG capsule Take 1 capsule (300 mg total) by mouth every evening.  . rosuvastatin (CRESTOR) 10 MG tablet TAKE ONE TABLET BY MOUTH ONCE DAILY. Due FOR FOLLOWING UP office visits AND labs. No refills UNTIL seen  . sertraline (ZOLOFT)  100 MG tablet Take 1 tablet (100 mg total) by mouth 2 (two) times daily.  . Vibegron (GEMTESA) 75 MG TABS Take 75 mg by mouth daily.  . Vitamin D, Cholecalciferol, 25 MCG (1000 UT) CAPS Take by mouth.   No facility-administered encounter medications on file as of 08/25/2020.    Patient husband called and stated they have 8 boxes of the 3mg  Trulicity pens that come from patient assistance.  Mr. Bellotti stated that when the patient came in for her recent visit, that her dose had increased to 4.5mg /weekly.   He wanted to know what to do with those pens and where we go from here.   I told him I would contact Donette Larry, CPP to find out and let them know.  Donette Larry, CPP said to continue present dose, she will send new script to [facility] for dose increase.  I have put in a note to call Lily on Wednesday to follow up to make sure the new script was received and is in process.    Called Mr. Theissen to let him know we are processing a new script for her.  Once the new medication comes, he was instructed to make sure the patient can tolerate the new dose before discarding the 3mg  pens.  He understood and will be waiting for the new pens to come.  Clarita Leber, [place] Pharmacist Assistant [phone]

## 2020-08-27 NOTE — Progress Notes (Signed)
Called [employer] to check on increase dose of Trulicity from 41m/weekly to 4.5mg /weekly.  Representative stated they did not receive a new order, asked me if I wanted to give a verbal.  I checked with Donette Larry, CPP to make sure this was ok to do, she confirmed a verbal was fine.  I spoke with pharmacist, gave order to increase dose, they will process and get it out as soon as possible.  I notified Donette Larry, [place], [place] Pharmacist Assistant [phone]

## 2020-09-08 ENCOUNTER — Telehealth: Payer: Self-pay

## 2020-09-08 NOTE — Progress Notes (Signed)
    Chronic Care Management Pharmacy Assistant   Name: Christy Crawford  MRN: [id number] DOB: 30-Jan-1956  Reason for Encounter: Medication coordination of acute refill request   Recent office visits:  08/18/20-Dr Cox PCP, ;abs, start Macrobid BID, start Vibegron daily, labs Blood count normal.   Liver function normal.  Kidney function normal.  Cholesterol: Well-controlled, HBA1C: 7.7. Please confirm the patient is not taking [place]. Recommend increase Trulicity to 4.5 mg weekly.  Recent consult visits:  none  Hospital visits:  None in previous 6 months  Medications: Outpatient Encounter Medications as of 09/08/2020  Medication Sig  . acetaminophen (TYLENOL) 325 MG tablet Take 325 mg by mouth every 6 (six) hours as needed.  . ALPRAZolam (XANAX) 1 MG tablet Take 1 mg by mouth in the morning, at noon, in the evening, and at bedtime.   Marland Kitchen amitriptyline (ELAVIL) 25 MG tablet Take 1 tablet (25 mg total) by mouth at bedtime.  Marland Kitchen aspirin-acetaminophen-caffeine (EXCEDRIN MIGRAINE) 250-250-65 MG tablet Take 2 tablets by mouth in the morning.  [name] Choice Lancets 28G MISC E11.4 Use new lancet each time when checking blood sugar  . COMFORT EZ PEN NEEDLES 31G X 5 MM MISC E11.44 use new needle with each new injection  . Dulaglutide (TRULICITY) 3 KW/4.0XB SOPN Inject 0.5 mLs (3 mg total) as directed once a week.  . furosemide (LASIX) 20 MG tablet TAKE ONE TABLET BY MOUTH ONCE DAILY  . glucose blood (CLEVER CHOICE MICRO TEST) test strip E11.44 Use new strip each time when checking blood sugar. Check sugar 1-3 times daily.  . insulin degludec (TRESIBA FLEXTOUCH) 100 UNIT/ML FlexTouch Pen Inject 65 Units into the skin daily.  Marland Kitchen lisinopril (ZESTRIL) 40 MG tablet TAKE 1 TABLET BY MOUTH EVERY MORNING AND1 TABLET BY MOUTH AT BEDTIME  . montelukast (SINGULAIR) 10 MG tablet TAKE ONE TABLET BY MOUTH EVERYDAY AT BEDTIME  . Multiple Vitamins-Minerals (CENTRUM SILVER PO) Take 1 tablet by mouth daily.  .  nitrofurantoin, macrocrystal-monohydrate, (MACROBID) 100 MG capsule Take 1 capsule (100 mg total) by mouth 2 (two) times daily.  . pregabalin (LYRICA) 300 MG capsule Take 1 capsule (300 mg total) by mouth every evening.  . rosuvastatin (CRESTOR) 10 MG tablet TAKE ONE TABLET BY MOUTH ONCE DAILY. Due FOR FOLLOWING UP office visits AND labs. No refills UNTIL seen  . sertraline (ZOLOFT) 100 MG tablet Take 1 tablet (100 mg total) by mouth 2 (two) times daily.  . Vibegron (GEMTESA) 75 MG TABS Take 75 mg by mouth daily.  . Vitamin D, Cholecalciferol, 25 MCG (1000 UT) CAPS Take by mouth.   No facility-administered encounter medications on file as of 09/08/2020.    Patient husband called and stated the patient would be out of Rosuvastatin 10mg  daily   Alprazolam 1mg  that she takes 4 times a day, by the end of the week.  I have put in a refill request to speciality office for Alprazolam, they are going to send new script  I have completed and submitted an acute form to [facility]  Donette Larry, CPP notified  Clarita Leber, [place] Pharmacist Assistant [phone]

## 2020-09-11 ENCOUNTER — Ambulatory Visit (INDEPENDENT_AMBULATORY_CARE_PROVIDER_SITE_OTHER): Payer: Medicare HMO

## 2020-09-11 DIAGNOSIS — E782 Mixed hyperlipidemia: Secondary | ICD-10-CM | POA: Diagnosis not present

## 2020-09-11 DIAGNOSIS — E1144 Type 2 diabetes mellitus with diabetic amyotrophy: Secondary | ICD-10-CM

## 2020-09-11 NOTE — Patient Instructions (Addendum)
Visit Information   Goals Addressed             This Visit's Progress    Manage My Medicine   On track    Timeframe:  Long-Range Goal Priority:  High Start Date:           05/29/2020                  Expected End Date: 05/29/2021                      Follow Up Date 09/26/2020    - call for medicine refill 2 or 3 days before it runs out - keep a list of all the medicines I take; vitamins and herbals too - use a pillbox to sort medicine    Why is this important?   These steps will help you keep on track with your medicines.   Notes:       Monitor and Manage My Blood Sugar-Diabetes Type 2   On track    Timeframe:  Long-Range Goal Priority:  High Start Date:        05/29/2020                     Expected End Date:  05/29/2021                     Follow Up Date 09/26/2020    - check blood sugar at prescribed times - check blood sugar if I feel it is too high or too low - enter blood sugar readings and medication or insulin into daily log - take the blood sugar log to all doctor visits    Why is this important?   Checking your blood sugar at home helps to keep it from getting very high or very low.  Writing the results in a diary or log helps the doctor know how to care for you.  Your blood sugar log should have the time, date and the results.  Also, write down the amount of insulin or other medicine that you take.  Other information, like what you ate, exercise done and how you were feeling, will also be helpful.     Notes:       Track and Manage My Blood Pressure-Hypertension   On track    Timeframe:  Long-Range Goal Priority:  High Start Date:         05/29/2020                    Expected End Date:   05/29/2021                    Follow Up Date 09/26/2020    - check blood pressure daily - write blood pressure results in a log or diary    Why is this important?   You won't feel high blood pressure, but it can still hurt your blood vessels.  High blood  pressure can cause heart or kidney problems. It can also cause a stroke.  Making lifestyle changes like losing a little weight or eating less salt will help.  Checking your blood pressure at home and at different times of the day can help to control blood pressure.  If the doctor prescribes medicine remember to take it the way the doctor ordered.  Call the office if you cannot afford the medicine or if there are questions about it.  Notes:         Patient Care Plan: ccm pharmacy care plan     Problem Identified: dm,hld,htn   Priority: High  Onset Date: 05/29/2020     Long-Range Goal: disease management   Start Date: 05/29/2020  Expected End Date: 05/29/2021  Recent Progress: On track  Priority: High  Note:   Current Barriers:  Unable to independently afford treatment regimen  Pharmacist Clinical Goal(s):  Over the next 90 days, patient will maintain control of blood pressure, cholesterol and blood sugar as evidenced by lab results  through collaboration with PharmD and provider.    Interventions: 1:1 collaboration with Cox, Kirsten, MD regarding development and update of comprehensive plan of care as evidenced by provider attestation and co-signature Inter-disciplinary care team collaboration (see longitudinal plan of care) Comprehensive medication review performed; medication list updated in electronic medical record  Hypertension (BP goal <130/80) -controlled -Current treatment: furosemide 20 mg daily  Lisinopril 40 mg am and pm  -Medications previously tried: none reported -Current home readings: 130/70 -Current dietary habits: has very little appetite lately. States she is eating too late causing higher fasting blood sugars.  -Current exercise habits: physical therapy exercises with her husband and walking with a walker.  -Denies hypotensive/hypertensive symptoms -Educated on BP goals and benefits of medications for prevention of heart attack, stroke and kidney  damage; Daily salt intake goal < 2300 mg; Exercise goal of 150 minutes per week; -Counseled to monitor BP at home daily, document, and provide log at future appointments -Counseled on diet and exercise extensively Recommended to continue current medication  Hyperlipidemia: (LDL goal < 70) -controlled -Current treatment: rosuvastatin 10 mg daily  -Medications previously tried: none reported  -Current dietary patterns: patient has limited appetite. Avoids cookies/cakes. Eating later in the evening before insulin.  -Current exercise habits: completing physical therapy exercises on her own 3 times weekly.  -Educated on Cholesterol goals;  Benefits of statin for ASCVD risk reduction; Importance of limiting foods high in cholesterol; Exercise goal of 150 minutes per week; -Counseled on diet and exercise extensively Recommended to continue current medication  Diabetes (A1c goal <7%) -uncontrolled -Current medications: Trulicity 4.5 mg weekly  Tresiba 65 units daily  -Medications previously tried: glimepiride -Current home glucose readings fasting glucose: Typically between 86-109 post prandial glucose: not checking -Denies hypoglycemic/hyperglycemic symptoms -Current meal patterns:  breakfast: limited appetite lunch: limited appetite. Being careful with portion sizes and chewing well due to choking.  dinner: eating less and chewing carefully  snacks: denies cookies and cakes drinks: water and coffee -Current exercise: working on physical therapy exercises at home on her own -Educated onA1c and blood sugar goals; Exercise goal of 150 minutes per week; Prevention and management of hypoglycemic episodes; -Counseled to check feet daily and get yearly eye exams -Counseled on diet and exercise extensively Recommended to continue current medication and contact pharmacist or provider with any low blood sugars to discuss reduce dose of Tresiba.   Fibromyalgia and Nerve Pain  (Goal:  manage pain and symptoms) -controlled -Current treatment  Amitriptyline 25 mg at bedtime Pregabalin 300 mg daily  -Medications previously tried: none reported  -Recommended to continue current medication   Osteoporosis / Osteopenia (Goal get updated Dexa scan) -Last DEXA Scan: patient has not scheduled  -Current treatment  Vitamin D 1000 daily  -Medications previously tried: none reported  -Recommend [phone] units of vitamin D daily. Recommend 1200 mg of calcium daily from dietary and supplemental sources. Recommend weight-bearing and muscle strengthening exercises  for building and maintaining bone density. -Counseled on diet and exercise extensively Recommended patient schedule Dexa scan  Health Maintenance -Vaccine gaps: Patient not interested in COVID Vaccine. Shingles and TDAP are recommended.  -Current therapy:  acetaminophen 325 mg every 6 hours prn Aspirin-acetaminophen-caffeine 2 tablets by mouth in the morning -Educated on COVID vaccine.  -Patient is satisfied with current therapy and denies issues -Recommended to continue current medication   Patient Goals/Self-Care Activities Over the next 90 days, patient will:  - take medications as prescribed check glucose daily, document, and provide at future appointments check blood pressure daily, document, and provide at future appointments collaborate with provider on medication access solutions  Follow Up Plan: Telephone follow up appointment with care management team member scheduled for: 03/2021      Patient verbalizes understanding of instructions provided today and agrees to view in [place].  Telephone follow up appointment with pharmacy team member scheduled for: 03/2021  Burnice Logan, [facility]

## 2020-09-11 NOTE — Progress Notes (Signed)
Chronic Care Management Pharmacy Note  09/11/2020 Name:  Christy Crawford MRN:  [id number] DOB:  [birth year]  Plan:  Christy Crawford has received her Trulicity 4.5 mg weekly and denies concerns or questions at this time. Reports blood sugar is well controlled fasting. Discussed the option of reducing insulin to avoid low blood sugar. Patient denies concerns at this time but will call if any needs arise.  Patient reports [name] is helping with bladder symptoms much better than previous medications.   Subjective: Christy Crawford is an 65 y.o. year old female who is a primary patient of Cox, Kirsten, MD.  The CCM team was consulted for assistance with disease management and care coordination needs.    Engaged with patient by telephone for follow up visit in response to provider referral for pharmacy case management and/or care coordination services.   Consent to Services:  The patient was given information about Chronic Care Management services, agreed to services, and gave verbal consent prior to initiation of services.  Please see initial visit note for detailed documentation.   Patient Care Team: [name], MD as PCP - General (Family Medicine) Marcial Pacas, MD as Consulting Physician (Neurology) Chucky May, MD as Consulting Physician (Psychiatry) Lynnell Dike, OD (Optometry) Burnice Logan, [facility] as Pharmacist (Pharmacist)  Recent office visits: 08/18/2020 - start on Gemtesa 75 mg daily for urge incontinence. Stop oxybutynin. Ordering swallowing study.  05/12/2020 - recommend healthy diet and exercise in wheel chair. Labs look good no changes to medication per Dr. Tobie Poet.   Recent consult visits: 04/21/2020 - ED visit for pain and weakness. Recent falls.   Hospital visits: None since last visit    Objective:  Lab Results  Component Value Date   CREATININE 0.91 08/18/2020   BUN 13 08/18/2020   GFRNONAA 60 05/12/2020   GFRAA 69 05/12/2020   NA 144 08/18/2020   K 4.6 08/18/2020    CALCIUM 9.5 08/18/2020   CO2 24 08/18/2020    Lab Results  Component Value Date/Time   HGBA1C 7.7 (H) 08/18/2020 03:31 PM   HGBA1C 7.3 (H) 05/12/2020 02:40 PM   HGBA1C 7.5 02/12/2019 12:00 AM    Last diabetic Eye exam:  Lab Results  Component Value Date/Time   HMDIABEYEEXA Retinopathy (A) 05/07/2020 12:00 AM    Last diabetic Foot exam: No results found for: HMDIABFOOTEX   Lab Results  Component Value Date   CHOL 154 08/18/2020   HDL 52 08/18/2020   LDLCALC 76 08/18/2020   TRIG 148 08/18/2020   CHOLHDL 3.0 08/18/2020    Hepatic Function Latest Ref Rng & Units 08/18/2020 05/12/2020 01/01/2020  Total Protein 6.0 - 8.5 g/dL 6.6 6.5 6.8  Albumin 3.8 - 4.8 g/dL 4.4 4.3 4.5  AST 0 - 40 IU/L 37 28 34  ALT 0 - 32 IU/L _0 Alk Phosphatase 44 - 121 IU/L 104 113 124(H)  Total Bilirubin 0.0 - 1.2 mg/dL 0.3 0.3 0.4    Lab Results  Component Value Date/Time   TSH 4.060 06/13/2019 10:33 AM    CBC Latest Ref Rng & Units 08/18/2020 05/12/2020 01/01/2020  WBC 3.4 - 10.8 x10E3/uL 7.0 7.6 8.0  Hemoglobin 11.1 - 15.9 g/dL 14.4 14.4 14.6  Hematocrit 34.0 - 46.6 % 43.2 43.3 43.0  Platelets 150 - 450 x10E3/uL 190 219 215    No results found for: VD25OH  Clinical ASCVD: No  The 10-year ASCVD risk score Mikey Bussing DC Jr., et al., 2013) is: 16.1%   Values  used to calculate the score:     Age: 39 years     Sex: Female     Is Non-Hispanic African American: No     Diabetic: Yes     Tobacco smoker: No     Systolic Blood Pressure: 416 mmHg     Is BP treated: Yes     HDL Cholesterol: 52 mg/dL     Total Cholesterol: 154 mg/dL    Depression screen [facility] 2/9 08/18/2020 05/29/2020 05/12/2020  Decreased Interest 1 0 0  Down, Depressed, Hopeless 1 0 0  PHQ - 2 Score 2 0 0  Altered sleeping 0 - -  Tired, decreased energy 2 - -  Change in appetite 1 - -  Feeling bad or failure about yourself  0 - -  Trouble concentrating 0 - -  Moving slowly or fidgety/restless 3 - -  Suicidal thoughts 0 - -  PHQ-9  Score 8 - -  Difficult doing work/chores Somewhat difficult - -     Social History   Tobacco Use  Smoking Status Former   Pack years: 0.00   Types: Cigarettes   Quit date: 2000   Years since quitting: 22.4  Smokeless Tobacco Never   BP Readings from Last 3 Encounters:  08/18/20 138/84  05/12/20 120/70  01/01/20 (!) 142/78   Pulse Readings from Last 3 Encounters:  08/18/20 88  05/12/20 84  01/01/20 91   Wt Readings from Last 3 Encounters:  08/18/20 (!) 310 lb (140.6 kg)  01/01/20 (!) 318 lb (144.2 kg)  07/11/19 (!) 318 lb (144.2 kg)    Assessment/Interventions: Review of patient past medical history, allergies, medications, health status, including review of consultants reports, laboratory and other test data, was performed as part of comprehensive evaluation and provision of chronic care management services.   SDOH:  (Social Determinants of Health) assessments and interventions performed: Yes   CCM Care Plan  No Known Allergies  Medications Reviewed Today     Reviewed by Burnice Logan, [facility] (Pharmacist) on 09/11/20 at [place] List Status: <None>   Medication Order Taking? Sig Documenting Provider Last Dose Status Informant  acetaminophen (TYLENOL) 325 MG tablet [id number] Yes Take 325 mg by mouth every 6 (six) hours as needed. [provider] Taking Active   ALPRAZolam Duanne Moron) 1 MG tablet [id number] Yes Take 1 mg by mouth in the morning, at noon, in the evening, and at bedtime.  [provider] Taking Active   amitriptyline (ELAVIL) 25 MG tablet [id number] Yes Take 1 tablet (25 mg total) by mouth at bedtime. Cox, Kirsten, MD Taking Active   aspirin-acetaminophen-caffeine [facility] MIGRAINE) [phone] MG tablet [id number] Yes Take 2 tablets by mouth in the morning. [provider] Taking Active   Clever Choice Lancets 28G [place] [id number] Yes E11.4 Use new lancet each time when checking blood sugar Cox, Kirsten, MD Taking Active   COMFORT EZ PEN  NEEDLES 31G X 5 MM MISC [id number] Yes E11.44 use new needle with each new injection Cox, Kirsten, MD Taking Active   Dulaglutide (TRULICITY) 4.5 KK/9.3GH SOPN [id number] Yes Inject 4.5 mg into the skin once a week. [provider] Taking Active   furosemide (LASIX) 20 MG tablet [id number] Yes TAKE ONE TABLET BY MOUTH ONCE DAILY Cox, Kirsten, MD Taking Active   glucose blood (CLEVER CHOICE MICRO TEST) test strip [id number] Yes E11.44 Use new strip each time when checking blood sugar. Check sugar 1-3 times daily. CoxElnita Maxwell, MD Taking Active   insulin  degludec (TRESIBA FLEXTOUCH) 100 UNIT/ML FlexTouch Pen [id number] Yes Inject 65 Units into the skin daily. Cox, Kirsten, MD Taking Active   lisinopril (ZESTRIL) 40 MG tablet [id number] Yes TAKE 1 TABLET BY MOUTH EVERY MORNING AND1 TABLET BY MOUTH AT BEDTIME Cox, Kirsten, MD Taking Active   montelukast (SINGULAIR) 10 MG tablet [id number] Yes TAKE ONE TABLET BY MOUTH EVERYDAY AT BEDTIME Cox, Kirsten, MD Taking Active   Multiple Vitamins-Minerals (CENTRUM SILVER PO) [id number] Yes Take 1 tablet by mouth daily. [provider] Taking Active   pregabalin (LYRICA) 300 MG capsule [id number] Yes Take 1 capsule (300 mg total) by mouth every evening. Cox, Kirsten, MD Taking Active   rosuvastatin (CRESTOR) 10 MG tablet [id number] Yes TAKE ONE TABLET BY MOUTH ONCE DAILY. Due FOR FOLLOWING UP office visits AND labs. No refills UNTIL seen Cox, Elnita Maxwell, MD Taking Active   sertraline (ZOLOFT) 100 MG tablet [id number] Yes Take 1 tablet (100 mg total) by mouth 2 (two) times daily. Cox, Kirsten, MD Taking Active   Vibegron [facility]) 75 MG TABS [id number] Yes Take 75 mg by mouth daily. Cox, Kirsten, MD Taking Active   Vitamin D, Cholecalciferol, 25 MCG (1000 UT) CAPS [id number] Yes Take by mouth. 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 ([place]) 06/21/2019   Chronic pain of both shoulders 06/21/2019   Chronic pain of right knee 06/21/2019   Dyslipidemia associated with type 2 diabetes mellitus ([place]) 06/13/2019   Mixed hyperlipidemia 06/13/2019    Immunization History  Administered Date(s) Administered   Fluad Quad(high Dose 65+) 01/01/2020   Influenza-Unspecified 08/25/2017, 02/20/2018, 02/12/2019   Pneumococcal Polysaccharide-23 01/01/2020    Conditions to be addressed/monitored:  Hypertension, Hyperlipidemia, Diabetes, Depression, Anxiety and Osteoporosis  Care Plan : ccm pharmacy care plan  Updates made by Burnice Logan, [place] since 09/11/2020 12:00 AM     Problem: dm,hld,htn   Priority: High  Onset Date: 05/29/2020     Long-Range Goal: disease management   Start Date: 05/29/2020  Expected End Date: 05/29/2021  Recent Progress: On track  Priority: High  Note:   Current Barriers:  Unable to independently afford treatment regimen  Pharmacist Clinical Goal(s):  Over the next 90 days, patient will maintain control of blood pressure, cholesterol and blood sugar as evidenced by lab results  through collaboration with PharmD and provider.    Interventions: 1:1 collaboration with Cox, Kirsten, MD regarding development and update of comprehensive plan of care as evidenced by provider attestation and co-signature Inter-disciplinary care team collaboration (see longitudinal plan of care) Comprehensive medication review performed; medication list updated in electronic medical record  Hypertension (BP goal <130/80) -controlled -Current treatment: furosemide 20 mg daily  Lisinopril 40 mg am and pm  -Medications previously tried: none reported -Current home  readings: 130/70 -Current dietary habits: has very little appetite lately. States she is eating too late causing higher fasting blood sugars.  -Current exercise habits: physical therapy exercises with her husband and walking with a walker.  -Denies hypotensive/hypertensive symptoms -Educated on BP goals and benefits of medications for prevention of heart attack, stroke  and kidney damage; Daily salt intake goal < 2300 mg; Exercise goal of 150 minutes per week; -Counseled to monitor BP at home daily, document, and provide log at future appointments -Counseled on diet and exercise extensively Recommended to continue current medication  Hyperlipidemia: (LDL goal < 70) -controlled -Current treatment: rosuvastatin 10 mg daily  -Medications previously tried: none reported  -Current dietary patterns: patient has limited appetite. Avoids cookies/cakes. Eating later in the evening before insulin.  -Current exercise habits: completing physical therapy exercises on her own 3 times weekly.  -Educated on Cholesterol goals;  Benefits of statin for ASCVD risk reduction; Importance of limiting foods high in cholesterol; Exercise goal of 150 minutes per week; -Counseled on diet and exercise extensively Recommended to continue current medication  Diabetes (A1c goal <7%) -uncontrolled -Current medications: Trulicity 4.5 mg weekly  Tresiba 65 units daily  -Medications previously tried: glimepiride -Current home glucose readings fasting glucose: Typically between 86-109 post prandial glucose: not checking -Denies hypoglycemic/hyperglycemic symptoms -Current meal patterns:  breakfast: limited appetite lunch: limited appetite. Being careful with portion sizes and chewing well due to choking.  dinner: eating less and chewing carefully  snacks: denies cookies and cakes drinks: water and coffee -Current exercise: working on physical therapy exercises at home on her own -Educated onA1c and blood sugar  goals; Exercise goal of 150 minutes per week; Prevention and management of hypoglycemic episodes; -Counseled to check feet daily and get yearly eye exams -Counseled on diet and exercise extensively Recommended to continue current medication and contact pharmacist or provider with any low blood sugars to discuss reduce dose of Tresiba.   Fibromyalgia and Nerve Pain  (Goal: manage pain and symptoms) -controlled -Current treatment  Amitriptyline 25 mg at bedtime Pregabalin 300 mg daily  -Medications previously tried: none reported  -Recommended to continue current medication   Osteoporosis / Osteopenia (Goal get updated Dexa scan) -Last DEXA Scan: patient has not scheduled  -Current treatment  Vitamin D 1000 daily  -Medications previously tried: none reported  -Recommend [phone] units of vitamin D daily. Recommend 1200 mg of calcium daily from dietary and supplemental sources. Recommend weight-bearing and muscle strengthening exercises for building and maintaining bone density. -Counseled on diet and exercise extensively Recommended patient schedule Dexa scan  Health Maintenance -Vaccine gaps: Patient not interested in COVID Vaccine. Shingles and TDAP are recommended.  -Current therapy:  acetaminophen 325 mg every 6 hours prn Aspirin-acetaminophen-caffeine 2 tablets by mouth in the morning -Educated on COVID vaccine.  -Patient is satisfied with current therapy and denies issues -Recommended to continue current medication   Patient Goals/Self-Care Activities Over the next 90 days, patient will:  - take medications as prescribed check glucose daily, document, and provide at future appointments check blood pressure daily, document, and provide at future appointments collaborate with provider on medication access solutions  Follow Up Plan: Telephone follow up appointment with care management team member scheduled for: 03/2021       Medication Assistance:  Tyler Aas and Trulicity  obtained through [place]  medication assistance program.  Enrollment ends 04/04/2021  Patient's preferred pharmacy is:  [facility] [place], [place] - [address] [place] [address] Phone: [phone] Fax: [phone]  [place], [place] - [address] [place] [address] Phone: [phone] Fax: [phone]  Uses pill box? Yes Pt endorses good compliance  We discussed: Benefits of medication synchronization, packaging and delivery as well  as enhanced pharmacist oversight with [facility]. Patient decided to: Utilize [facility] pharmacy for medication synchronization, packaging and delivery  Care Plan and Follow Up Patient Decision:  Patient agrees to Care Plan and Follow-up.  Plan: Telephone follow up appointment with care management team member scheduled for:  03/2021

## 2020-09-15 ENCOUNTER — Telehealth: Payer: Medicare HMO

## 2020-09-18 ENCOUNTER — Telehealth: Payer: Self-pay

## 2020-09-18 NOTE — Telephone Encounter (Signed)
Mr. Odonell called to report tat Christy Crawford is not tolerating the higher dose of Trulicity. She has experienced some fever and chills which has resolved and does not seem to be related to the medication.  She has loss of appetite and some diarrhea.  Symptoms discussed with Christy Duncans, PA.  Christy Crawford is going to go back to the lower dose of Trulicity call us back with an update.

## 2020-09-19 ENCOUNTER — Telehealth: Payer: Self-pay

## 2020-09-19 NOTE — Progress Notes (Signed)
Chronic Care Management Pharmacy Assistant   Name: Christy Crawford  MRN: [id number] DOB: [birth year]   Reason for Encounter: Medication coordination for [facility]  Recent office visits:  09/18/20-patient message, Mr. Stys called to report tat Christy Crawford is not tolerating the higher dose of Trulicity. She has experienced some fever and chills which has resolved and does not seem to be related to the medication.  She has loss of appetite and some diarrhea.  Symptoms discussed with Christy Duncans, PA.  Christy Crawford is going to go back to the lower dose of Trulicity call us back with an update.    09/11/20-Christy Crawford, CPP, [place] has received her Trulicity 4.5 mg weekly and denies concerns or questions at this time. Reports blood sugar is well controlled fasting. Discussed the option of reducing insulin to avoid low blood sugar. Patient denies concerns at this time but will call if any needs arise. Patient reports Christy Crawford is helping with bladder symptoms much better than previous medications.   Recent consult visits:  [facility] visits:  None in previous 6 months  Medications: Outpatient Encounter Medications as of 09/19/2020  Medication Sig   acetaminophen (TYLENOL) 325 MG tablet Take 325 mg by mouth every 6 (six) hours as needed.   ALPRAZolam (XANAX) 1 MG tablet Take 1 mg by mouth in the morning, at noon, in the evening, and at bedtime.    amitriptyline (ELAVIL) 25 MG tablet Take 1 tablet (25 mg total) by mouth at bedtime.   aspirin-acetaminophen-caffeine (EXCEDRIN MIGRAINE) 250-250-65 MG tablet Take 2 tablets by mouth in the morning.   Clever Choice Lancets 28G MISC E11.4 Use new lancet each time when checking blood sugar   COMFORT EZ PEN NEEDLES 31G X 5 MM MISC E11.44 use new needle with each new injection   Dulaglutide (TRULICITY) 4.5 QP/6.1PJ SOPN Inject 4.5 mg into the skin once a week.   furosemide (LASIX) 20 MG tablet TAKE ONE TABLET BY MOUTH ONCE DAILY   glucose blood (CLEVER CHOICE  MICRO TEST) test strip E11.44 Use new strip each time when checking blood sugar. Check sugar 1-3 times daily.   insulin degludec (TRESIBA FLEXTOUCH) 100 UNIT/ML FlexTouch Pen Inject 65 Units into the skin daily.   lisinopril (ZESTRIL) 40 MG tablet TAKE 1 TABLET BY MOUTH EVERY MORNING AND1 TABLET BY MOUTH AT BEDTIME   montelukast (SINGULAIR) 10 MG tablet TAKE ONE TABLET BY MOUTH EVERYDAY AT BEDTIME   Multiple Vitamins-Minerals (CENTRUM SILVER PO) Take 1 tablet by mouth daily.   pregabalin (LYRICA) 300 MG capsule Take 1 capsule (300 mg total) by mouth every evening.   rosuvastatin (CRESTOR) 10 MG tablet TAKE ONE TABLET BY MOUTH ONCE DAILY. Due FOR FOLLOWING UP office visits AND labs. No refills UNTIL seen   sertraline (ZOLOFT) 100 MG tablet Take 1 tablet (100 mg total) by mouth 2 (two) times daily.   Vibegron (GEMTESA) 75 MG TABS Take 75 mg by mouth daily.   Vitamin D, Cholecalciferol, 25 MCG (1000 UT) CAPS Take by mouth.   No facility-administered encounter medications on file as of 09/19/2020.    Reviewed chart for medication changes ahead of medication coordination call.  No OVs, Consults, or hospital visits since last care coordination call/Pharmacist visit. (If appropriate, list visit date, provider name)  No medication changes indicated OR if recent visit, treatment plan here.  BP Readings from Last 3 Encounters:  08/18/20 138/84  05/12/20 120/70  01/01/20 (!) 142/78    Lab Results  Component Value  Date   HGBA1C 7.7 (H) 08/18/2020     Patient obtains medications through V[facility]  90 Days   Last adherence delivery included:  Patient has had several acute fills  Patient declined (meds) last month due to PRN use/additional supply on hand. None  Patient is due for next adherence delivery on: 09/24/20. Called patient and reviewed medications and coordinated delivery.  This delivery to include: Alprazolam  1mg  4 times a day Pregabalin 300mg  in evening Montelukast 10mg  at  bedtime Lisinopril 40mg  BID Rosuvastatin 10mg  daily Amitriptyline 25mg  at bedtime Gemtesa 75mg  Sertraline 100mg  BID   Patient declined the following medications (meds) due to (reason) none  Patient needs refills for . None according to [facility]   Confirmed delivery date of 09/24/20, advised patient that pharmacy will contact them the morning of delivery.  Clarita Leber, [place] Pharmacist Assistant [phone]

## 2020-10-09 ENCOUNTER — Telehealth: Payer: Self-pay

## 2020-10-09 NOTE — Progress Notes (Signed)
    Chronic Care Management Pharmacy Assistant   Name: Christy Crawford  MRN: [id number] DOB: 02-Jan-1956  Reason for Encounter: Acute medication coordination for [facility]  Medications: Outpatient Encounter Medications as of 10/09/2020  Medication Sig   acetaminophen (TYLENOL) 325 MG tablet Take 325 mg by mouth every 6 (six) hours as needed.   ALPRAZolam (XANAX) 1 MG tablet Take 1 mg by mouth in the morning, at noon, in the evening, and at bedtime.    amitriptyline (ELAVIL) 25 MG tablet Take 1 tablet (25 mg total) by mouth at bedtime.   aspirin-acetaminophen-caffeine (EXCEDRIN MIGRAINE) 250-250-65 MG tablet Take 2 tablets by mouth in the morning.   Clever Choice Lancets 28G MISC E11.4 Use new lancet each time when checking blood sugar   COMFORT EZ PEN NEEDLES 31G X 5 MM MISC E11.44 use new needle with each new injection   Dulaglutide (TRULICITY) 4.5 NW/2.9FA SOPN Inject 4.5 mg into the skin once a week.   furosemide (LASIX) 20 MG tablet TAKE ONE TABLET BY MOUTH ONCE DAILY   glucose blood (CLEVER CHOICE MICRO TEST) test strip E11.44 Use new strip each time when checking blood sugar. Check sugar 1-3 times daily.   insulin degludec (TRESIBA FLEXTOUCH) 100 UNIT/ML FlexTouch Pen Inject 65 Units into the skin daily.   lisinopril (ZESTRIL) 40 MG tablet TAKE 1 TABLET BY MOUTH EVERY MORNING AND1 TABLET BY MOUTH AT BEDTIME   montelukast (SINGULAIR) 10 MG tablet TAKE ONE TABLET BY MOUTH EVERYDAY AT BEDTIME   Multiple Vitamins-Minerals (CENTRUM SILVER PO) Take 1 tablet by mouth daily.   pregabalin (LYRICA) 300 MG capsule Take 1 capsule (300 mg total) by mouth every evening.   rosuvastatin (CRESTOR) 10 MG tablet TAKE ONE TABLET BY MOUTH ONCE DAILY. Due FOR FOLLOWING UP office visits AND labs. No refills UNTIL seen   sertraline (ZOLOFT) 100 MG tablet Take 1 tablet (100 mg total) by mouth 2 (two) times daily.   Vibegron (GEMTESA) 75 MG TABS Take 75 mg by mouth daily.   Vitamin D, Cholecalciferol, 25 MCG (1000  UT) CAPS Take by mouth.   No facility-administered encounter medications on file as of 10/09/2020.     Patient husband called and stated the patient was going to be out of her Alprazolam by 10/11/20, and needed that delivered on 10/10/20.  Mr. Rayson stated the patient was not able to tolerate the higer dose of Trulicity, so he has went back to the original dose amount, he stated her glucose levels have been between 89-100.  He stated she is doing well.  I  have put in the acute request form to [facility] for delivery on 10/10/20.  I have called the speciality office to send over refill as well    Clarita Leber, [place] Pharmacist Assistant [phone]

## 2020-10-10 NOTE — Progress Notes (Signed)
10/10/20-[facility] notified us that the patients insurance had terminated as of 10/02/20.  I contacted Mr. Vanzee to see if they had a new card, he stated one should be coming soon. I advised him of the medication cost for the patients Alprazolam, he stated is was ok to still deliver it today. I notified [facility] to proceed with delivery today.  I contacted [facility] to make sure her next delivery is for the original dosing per Donette Larry, CPP request.  The dose should now be 3mg  weekly, not 4.5mg  as per the patient husband she was unable to tolerate the increase. Spoke to pharmacist and made the change, they are going to ship a 4 month supply with one refill due to patient having to renew application in December 2022.    Clarita Leber, [place] Pharmacist Assistant [phone]

## 2020-10-23 DIAGNOSIS — R1310 Dysphagia, unspecified: Secondary | ICD-10-CM | POA: Diagnosis not present

## 2020-11-10 ENCOUNTER — Telehealth: Payer: Self-pay

## 2020-11-10 NOTE — Chronic Care Management (AMB) (Signed)
    Chronic Care Management Pharmacy Assistant   Name: OSHAY DOCKEN  MRN: [id number] DOB: 07/04/1955  Reason for Encounter: Acute Medication Coordination Pharmacy Refill  Medications: Outpatient Encounter Medications as of 11/10/2020  Medication Sig Note   acetaminophen (TYLENOL) 325 MG tablet Take 325 mg by mouth every 6 (six) hours as needed.    ALPRAZolam (XANAX) 1 MG tablet Take 1 mg by mouth in the morning, at noon, in the evening, and at bedtime.     amitriptyline (ELAVIL) 25 MG tablet Take 1 tablet (25 mg total) by mouth at bedtime.    aspirin-acetaminophen-caffeine (EXCEDRIN MIGRAINE) 250-250-65 MG tablet Take 2 tablets by mouth in the morning.    Clever Choice Lancets 28G MISC E11.4 Use new lancet each time when checking blood sugar    COMFORT EZ PEN NEEDLES 31G X 5 MM MISC E11.44 use new needle with each new injection    Dulaglutide (TRULICITY) 4.5 0000000 SOPN Inject 3 mg into the skin once a week. Patient unable to tolerate increase to 4.5 mg. She resumed 3 mg dose on her own. 10/13/2020: Patient unable to tolerate increase to 4.5 mg. She resumed 3 mg dose on her own. Medication comes from [facility]    furosemide (LASIX) 20 MG tablet TAKE ONE TABLET BY MOUTH ONCE DAILY    glucose blood (CLEVER CHOICE MICRO TEST) test strip E11.44 Use new strip each time when checking blood sugar. Check sugar 1-3 times daily.    insulin degludec (TRESIBA FLEXTOUCH) 100 UNIT/ML FlexTouch Pen Inject 65 Units into the skin daily.    lisinopril (ZESTRIL) 40 MG tablet TAKE 1 TABLET BY MOUTH EVERY MORNING AND1 TABLET BY MOUTH AT BEDTIME    montelukast (SINGULAIR) 10 MG tablet TAKE ONE TABLET BY MOUTH EVERYDAY AT BEDTIME    Multiple Vitamins-Minerals (CENTRUM SILVER PO) Take 1 tablet by mouth daily.    pregabalin (LYRICA) 300 MG capsule Take 1 capsule (300 mg total) by mouth every evening.    rosuvastatin (CRESTOR) 10 MG tablet TAKE ONE TABLET BY MOUTH ONCE DAILY. Due FOR FOLLOWING UP office visits AND labs.  No refills UNTIL seen    sertraline (ZOLOFT) 100 MG tablet Take 1 tablet (100 mg total) by mouth 2 (two) times daily.    Vibegron (GEMTESA) 75 MG TABS Take 75 mg by mouth daily.    Vitamin D, Cholecalciferol, 25 MCG (1000 UT) CAPS Take by mouth.    No facility-administered encounter medications on file as of 11/10/2020.   An acute fill form was prepared and submitted for Alprazolam 1 mg to be coordinated for delivery 08/09 by [place], CMA

## 2020-11-13 ENCOUNTER — Other Ambulatory Visit: Payer: Self-pay | Admitting: Family Medicine

## 2020-11-13 DIAGNOSIS — N3941 Urge incontinence: Secondary | ICD-10-CM

## 2020-11-20 ENCOUNTER — Ambulatory Visit: Payer: Medicare HMO | Admitting: Family Medicine

## 2020-12-01 ENCOUNTER — Other Ambulatory Visit: Payer: Self-pay

## 2020-12-01 MED ORDER — LISINOPRIL 40 MG PO TABS: ORAL_TABLET | ORAL | 1 refills | Status: DC

## 2020-12-01 NOTE — Progress Notes (Signed)
Subjective:  Patient ID: Christy Crawford, female    DOB: April 24, 1955  Age: 65 y.o. MRN: [id number]  Chief Complaint  Patient presents with   Diabetes   Hyperlipidemia    HPI   Current Outpatient Medications on File Prior to Visit  Medication Sig Dispense Refill   acetaminophen (TYLENOL) 325 MG tablet Take 325 mg by mouth every 6 (six) hours as needed.     ALPRAZolam (XANAX) 1 MG tablet Take 1 mg by mouth in the morning, at noon, in the evening, and at bedtime.      amitriptyline (ELAVIL) 25 MG tablet Take 1 tablet (25 mg total) by mouth at bedtime. 90 tablet 1   aspirin-acetaminophen-caffeine (EXCEDRIN MIGRAINE) 250-250-65 MG tablet Take 2 tablets by mouth in the morning.     Clever Choice Lancets 28G MISC E11.4 Use new lancet each time when checking blood sugar 100 each 2   COMFORT EZ PEN NEEDLES 31G X 5 MM MISC E11.44 use new needle with each new injection 100 each 2   Dulaglutide (TRULICITY) 4.5 0000000 SOPN Inject 3 mg into the skin once a week. Patient unable to tolerate increase to 4.5 mg. She resumed 3 mg dose on her own.     furosemide (LASIX) 20 MG tablet TAKE ONE TABLET BY MOUTH ONCE DAILY 90 tablet 0   GEMTESA 75 MG TABS TAKE ONE TABLET BY MOUTH DAILY 90 tablet 1   glucose blood (CLEVER CHOICE MICRO TEST) test strip E11.44 Use new strip each time when checking blood sugar. Check sugar 1-3 times daily. 100 each 2   insulin degludec (TRESIBA FLEXTOUCH) 100 UNIT/ML FlexTouch Pen Inject 65 Units into the skin daily. 60 mL 0   lisinopril (ZESTRIL) 40 MG tablet TAKE 1 TABLET BY MOUTH EVERY MORNING AND1 TABLET BY MOUTH AT BEDTIME 180 tablet 1   montelukast (SINGULAIR) 10 MG tablet TAKE ONE TABLET BY MOUTH EVERYDAY AT BEDTIME 90 tablet 1   Multiple Vitamins-Minerals (CENTRUM SILVER PO) Take 1 tablet by mouth daily.     pregabalin (LYRICA) 300 MG capsule Take 1 capsule (300 mg total) by mouth every evening. 180 capsule 1   rosuvastatin (CRESTOR) 10 MG tablet TAKE ONE TABLET BY MOUTH  ONCE DAILY. Due FOR FOLLOWING UP office visits AND labs. No refills UNTIL seen 90 tablet 1   sertraline (ZOLOFT) 100 MG tablet Take 1 tablet (100 mg total) by mouth 2 (two) times daily. 180 tablet 1   Vitamin D, Cholecalciferol, 25 MCG (1000 UT) CAPS Take by mouth.     No current facility-administered medications on file prior to visit.   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 ([facility])    Essential hypertension    Fibromyalgia    Generalized edema    Hyperlipidemia    Hypertension    Major depression    Migraine headache    Mixed hyperlipidemia    Sleep apnea    Telogen effluvium    Past Surgical History:  Procedure Laterality Date   CHOLECYSTECTOMY     lens implants Bilateral     Family History  Adopted: Yes  Problem Relation Age of Onset   Diabetes Maternal Aunt    Social History   Socioeconomic History   Marital status: Married    Spouse name: Not on file   Number of children:  1   Years of education: Not on file   Highest education level: Not on file  Occupational History   Occupation: disabled  Tobacco Use   Smoking status: Former    Types: Cigarettes    Quit date: 2000    Years since quitting: 22.6   Smokeless tobacco: Never  Substance and Sexual Activity   Alcohol use: Yes    Alcohol/week: 1.0 standard drink    Types: 1 Glasses of wine per week    Comment: socially   Drug use: Never   Sexual activity: Not on file  Other Topics Concern   Not on file  Social History Narrative   Not on file   Social Determinants of Health   Financial Resource Strain: Not on file  Food Insecurity: Not on file  Transportation Needs: Not on file  Physical Activity: Not on file  Stress: Not on file  Social Connections: Not on file    Review of Systems  Constitutional:  Negative for  chills, fatigue and fever.  HENT:  Negative for congestion, ear pain, rhinorrhea and sore throat.   Respiratory:  Negative for cough and shortness of breath.   Cardiovascular:  Negative for chest pain.  Gastrointestinal:  Negative for abdominal pain, constipation, diarrhea, nausea and vomiting.  Genitourinary:  Negative for dysuria and urgency.  Musculoskeletal:  Negative for back pain and myalgias.  Neurological:  Negative for dizziness, weakness, light-headedness and headaches.  Psychiatric/Behavioral:  Negative for dysphoric mood. The patient is not nervous/anxious.     Objective:  There were no vitals taken for this visit.  BP/Weight 08/18/2020 05/12/2020 [id number]  Systolic BP 0000000 [id number] [id number]  Diastolic BP 84 70 78  Wt. (Lbs) 310 - 318  BMI 42.04 43.13 43.13    Physical Exam Vitals reviewed.  Constitutional:      Appearance: Normal appearance. She is normal weight.  Neck:     Vascular: No carotid bruit.  Cardiovascular:     Rate and Rhythm: Normal rate and regular rhythm.     Pulses: Normal pulses.     Heart sounds: Normal heart sounds.  Pulmonary:     Effort: Pulmonary effort is normal. No respiratory distress.     Breath sounds: Normal breath sounds.  Abdominal:     General: Abdomen is flat. Bowel sounds are normal.     Palpations: Abdomen is soft.     Tenderness: There is no abdominal tenderness.  Neurological:     Mental Status: She is alert and oriented to person, place, and time.  Psychiatric:        Mood and Affect: Mood normal.        Behavior: Behavior normal.    Diabetic Foot Exam - Simple   No data filed      Lab Results  Component Value Date   WBC 7.0 08/18/2020   HGB 14.4 08/18/2020   HCT 43.2 08/18/2020   PLT 190 08/18/2020   GLUCOSE 65 08/18/2020   CHOL 154 08/18/2020   TRIG 148 08/18/2020   HDL 52 08/18/2020   LDLCALC 76 08/18/2020   ALT 22 08/18/2020   AST 37 08/18/2020   NA 144 08/18/2020   K 4.6 08/18/2020   CL 103 08/18/2020    CREATININE 0.91 08/18/2020   BUN 13 08/18/2020   CO2 24 08/18/2020   TSH 4.060 06/13/2019   HGBA1C 7.7 (H) 08/18/2020      Assessment & Plan:   1. 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 Mixed hyperlipidemia  3. Essential hypertension  4. Fibromyalgia   No orders of the defined types were placed in this encounter.   No orders of the defined types were placed in this encounter.    Follow-up: Return in about 3 months (around 03/04/2021) for fasting.  An After Visit Summary was printed and given to the patient.  [name], MD [facility] [phone]

## 2020-12-02 ENCOUNTER — Encounter: Payer: Self-pay | Admitting: Family Medicine

## 2020-12-02 ENCOUNTER — Ambulatory Visit (INDEPENDENT_AMBULATORY_CARE_PROVIDER_SITE_OTHER): Payer: Medicare Other | Admitting: Family Medicine

## 2020-12-02 ENCOUNTER — Other Ambulatory Visit: Payer: Self-pay

## 2020-12-02 VITALS — BP 128/60 | HR 80 | Temp 97.7°F | Resp 18

## 2020-12-02 DIAGNOSIS — Z23 Encounter for immunization: Secondary | ICD-10-CM

## 2020-12-02 DIAGNOSIS — Z1231 Encounter for screening mammogram for malignant neoplasm of breast: Secondary | ICD-10-CM | POA: Diagnosis not present

## 2020-12-02 DIAGNOSIS — E1144 Type 2 diabetes mellitus with diabetic amyotrophy: Secondary | ICD-10-CM

## 2020-12-02 DIAGNOSIS — Z780 Asymptomatic menopausal state: Secondary | ICD-10-CM | POA: Diagnosis not present

## 2020-12-02 DIAGNOSIS — M5412 Radiculopathy, cervical region: Secondary | ICD-10-CM | POA: Diagnosis not present

## 2020-12-02 DIAGNOSIS — M797 Fibromyalgia: Secondary | ICD-10-CM | POA: Diagnosis not present

## 2020-12-02 DIAGNOSIS — I10 Essential (primary) hypertension: Secondary | ICD-10-CM

## 2020-12-02 DIAGNOSIS — Z1211 Encounter for screening for malignant neoplasm of colon: Secondary | ICD-10-CM

## 2020-12-02 DIAGNOSIS — Z13820 Encounter for screening for osteoporosis: Secondary | ICD-10-CM | POA: Diagnosis not present

## 2020-12-02 DIAGNOSIS — E782 Mixed hyperlipidemia: Secondary | ICD-10-CM

## 2020-12-02 LAB — POCT UA - MICROALBUMIN: Microalbumin Ur, POC: 10 mg/L

## 2020-12-02 NOTE — Progress Notes (Signed)
10  Subjective:  Patient ID: Christy Crawford, female    DOB: 1955-07-13  Age: 65 y.o. MRN: [id number]  Chief Complaint  Patient presents with   Diabetes   Hyperlipidemia    HPI Diabetes:  Complications:amyotrophy/neuropathy Glucose checking: tid. Glucose logs: 80-120 Hypoglycemia: no  Most recent A1C: 7.7. Current medications: Trulcity 4.5 mg weekly. Tresiba 65 U daily. Lyrica 300 mg bid. On Amirtriptyline 25 mg once at bedtime.  Last Eye Exam: 12/2020 Foot checks: yes Lifestyle changes: Eating healthy. Limited exercises, but does use her hand weights to keep her upper body strong.   Hyperlipidemia: Current medications: crestor 10 mg once daily. Eating healthy.  Hypertension: Complications: none Current medications: lisinopril 40 mg one twice a day.   Pedal Edema: on Lasix 20 mg once daily. Depression: On zoloft has helped.   PHQ9 SCORE ONLY 12/02/2020 08/18/2020 05/29/2020  PHQ-9 Total Score 0 8 0     Current Outpatient Medications on File Prior to Visit  Medication Sig Dispense Refill   ALPRAZolam (XANAX) 1 MG tablet Take 1 mg by mouth in the morning, at noon, in the evening, and at bedtime.      amitriptyline (ELAVIL) 25 MG tablet Take 1 tablet (25 mg total) by mouth at bedtime. 90 tablet 1   aspirin-acetaminophen-caffeine (EXCEDRIN MIGRAINE) 250-250-65 MG tablet Take 2 tablets by mouth in the morning.     Dulaglutide (TRULICITY) 4.5 0000000 SOPN Inject 3 mg into the skin once a week. Patient unable to tolerate increase to 4.5 mg. She resumed 3 mg dose on her own.     furosemide (LASIX) 20 MG tablet TAKE ONE TABLET BY MOUTH ONCE DAILY 90 tablet 0   GEMTESA 75 MG TABS TAKE ONE TABLET BY MOUTH DAILY 90 tablet 1   insulin degludec (TRESIBA FLEXTOUCH) 100 UNIT/ML FlexTouch Pen Inject 65 Units into the skin daily. 60 mL 0   lisinopril (ZESTRIL) 40 MG tablet TAKE 1 TABLET BY MOUTH EVERY MORNING AND1 TABLET BY MOUTH AT BEDTIME 180 tablet 1   montelukast (SINGULAIR) 10 MG tablet  TAKE ONE TABLET BY MOUTH EVERYDAY AT BEDTIME 90 tablet 1   Multiple Vitamins-Minerals (CENTRUM SILVER PO) Take 1 tablet by mouth daily.     pregabalin (LYRICA) 300 MG capsule Take 1 capsule (300 mg total) by mouth every evening. 180 capsule 1   rosuvastatin (CRESTOR) 10 MG tablet TAKE ONE TABLET BY MOUTH ONCE DAILY. Due FOR FOLLOWING UP office visits AND labs. No refills UNTIL seen 90 tablet 1   sertraline (ZOLOFT) 100 MG tablet Take 1 tablet (100 mg total) by mouth 2 (two) times daily. (Patient taking differently: Take 200 mg by mouth daily.) 180 tablet 1   Vitamin D, Cholecalciferol, 25 MCG (1000 UT) CAPS Take 2,000 Int'l Units by mouth.     acetaminophen (TYLENOL) 325 MG tablet Take 325 mg by mouth every 6 (six) hours as needed.     Clever Choice Lancets 28G MISC E11.4 Use new lancet each time when checking blood sugar 100 each 2   COMFORT EZ PEN NEEDLES 31G X 5 MM MISC E11.44 use new needle with each new injection 100 each 2   glucose blood (CLEVER CHOICE MICRO TEST) test strip E11.44 Use new strip each time when checking blood sugar. Check sugar 1-3 times daily. 100 each 2   No current facility-administered medications on file prior to visit.   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)    Essential hypertension    Fibromyalgia    Generalized edema    Hyperlipidemia    Hypertension    Major depression    Migraine headache    Mixed hyperlipidemia    Sleep apnea    Telogen effluvium    Past Surgical History:  Procedure Laterality Date   CHOLECYSTECTOMY     lens implants Bilateral     Family History  Adopted: Yes  Problem Relation Age of Onset   Diabetes Maternal Aunt    Social History   Socioeconomic History   Marital status: Married    Spouse name: Not on file   Number of children: 1    Years of education: Not on file   Highest education level: Not on file  Occupational History   Occupation: disabled  Tobacco Use   Smoking status: Former    Types: Cigarettes    Quit date: 2000    Years since quitting: 22.6   Smokeless tobacco: Never  Substance and Sexual Activity   Alcohol use: Yes    Alcohol/week: 1.0 standard drink    Types: 1 Glasses of wine per week    Comment: socially   Drug use: Never   Sexual activity: Not on file  Other Topics Concern   Not on file  Social History Narrative   Not on file   Social Determinants of Health   Financial Resource Strain: Not on file  Food Insecurity: Not on file  Transportation Needs: Not on file  Physical Activity: Not on file  Stress: Not on file  Social Connections: Not on file    Review of Systems  Constitutional:  Negative for chills, fatigue and fever.  HENT:  Negative for congestion, ear pain, rhinorrhea and sore throat.   Respiratory:  Negative for cough and shortness of breath.   Cardiovascular:  Positive for leg swelling. Negative for chest pain and palpitations.  Gastrointestinal:  Positive for diarrhea (with certain vegetables ([employer]). Negative for abdominal pain, constipation, nausea and vomiting.  Endocrine: Positive for polyuria. Negative for polydipsia and polyphagia.  Genitourinary:  Negative for dysuria and urgency.       Delice Bison is working great for urge incontinence.  Musculoskeletal:  Positive for arthralgias and back pain. Negative for myalgias.       Rt arm has been hurting recently. Pain radiates from shoulder to hand. Burning and numb. Entire hand.   Neurological:  Positive for light-headedness. Negative for dizziness, weakness and headaches.  Psychiatric/Behavioral:  Negative for dysphoric mood. The patient is not nervous/anxious.     Objective:  BP 128/60   Pulse 80   Temp 97.7 F (36.5 C)   Resp 18   BP/Weight 12/02/2020 [id number]  Systolic BP 0000000 0000000 [id number]   Diastolic BP 60 84 70  Wt. (Lbs) - 310 -  BMI - 42.04 43.13    Physical Exam Vitals reviewed.  Constitutional:      Appearance: Normal appearance. She is obese.  Neck:     Vascular: No carotid bruit.  Cardiovascular:     Rate and Rhythm: Normal rate and regular rhythm.     Pulses: Normal pulses.     Heart sounds: Normal heart sounds.  Pulmonary:     Effort: Pulmonary effort is normal. No respiratory distress.     Breath sounds: Normal breath sounds.  Abdominal:     General: Abdomen  is flat. Bowel sounds are normal.     Palpations: Abdomen is soft.     Tenderness: There is no abdominal tenderness.  Neurological:     Mental Status: She is alert and oriented to person, place, and time.  Psychiatric:        Mood and Affect: Mood normal.        Behavior: Behavior normal.    Diabetic Foot Exam - Simple   Simple Foot Form  01/02/2021 12:26 AM  Visual Inspection No deformities, no ulcerations, no other skin breakdown bilaterally: Yes Sensation Testing See comments: Yes Pulse Check Posterior Tibialis and Dorsalis pulse intact bilaterally: Yes Comments Poor sensation. BL trace edema.      Lab Results  Component Value Date   WBC 6.5 12/02/2020   HGB 14.6 12/02/2020   HCT 44.0 12/02/2020   PLT 192 12/02/2020   GLUCOSE 90 12/02/2020   CHOL 146 12/02/2020   TRIG 99 12/02/2020   HDL 60 12/02/2020   LDLCALC 68 12/02/2020   ALT 16 12/02/2020   AST 25 12/02/2020   NA 142 12/02/2020   K 5.2 12/02/2020   CL 100 12/02/2020   CREATININE 0.89 12/02/2020   BUN 18 12/02/2020   CO2 28 12/02/2020   TSH 4.060 06/13/2019   HGBA1C 7.3 (H) 12/02/2020   MICROALBUR 10 12/02/2020      Assessment & Plan:   1. 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 Recommend check feet daily. Recommend annual eye exams. Medicines: no changes Continue to work on eating a healthy diet and exercise.  Labs drawn  today.   - Hemoglobin A1c - CBC with Differential/Platelet - POCT UA - Microalbumin  2. Mixed hyperlipidemia Well controlled.  No changes to medicines.  Continue to work on eating a healthy diet and exercise.  Labs drawn today.  - Lipid panel  3. Essential hypertension Well controlled.  No changes to medicines.  Continue to work on eating a healthy diet and exercise.  Labs drawn today.  - Comprehensive metabolic panel  4. Fibromyalgia Stable  5. Right cervical radiculopathy - DG Cervical Spine 2 or 3 views  6. Visit for screening mammogram - MM DIGITAL SCREENING BILATERAL  7. Encounter for osteoporosis screening in asymptomatic postmenopausal patient - DG Bone Density  8. Colon cancer screening - Cologuard  9. Need for influenza vaccination - Flu Vaccine QUAD High Dose(Fluad)   Orders Placed This Encounter  Procedures   DG Cervical Spine 2 or 3 views   DG Bone Density   MM DIGITAL SCREENING BILATERAL   Flu Vaccine QUAD High Dose(Fluad)   Lipid panel   Hemoglobin A1c   CBC with Differential/Platelet   Comprehensive metabolic panel   Cologuard   Cardiovascular Risk Assessment   POCT UA - Microalbumin     Follow-up: Return in about 3 months (around 03/04/2021) for fasting.  An After Visit Summary was printed and given to the patient.  [name], MD [facility] [phone]

## 2020-12-03 LAB — CBC WITH DIFFERENTIAL/PLATELET
Basophils Absolute: 0.1 10*3/uL (ref 0.0–0.2)
Basos: 1 %
EOS (ABSOLUTE): 0.3 10*3/uL (ref 0.0–0.4)
Eos: 4 %
Hematocrit: 44 % (ref 34.0–46.6)
Hemoglobin: 14.6 g/dL (ref 11.1–15.9)
Immature Grans (Abs): 0.1 10*3/uL (ref 0.0–0.1)
Immature Granulocytes: 1 %
Lymphocytes Absolute: 1.7 10*3/uL (ref 0.7–3.1)
Lymphs: 26 %
MCH: 28.4 pg (ref 26.6–33.0)
MCHC: 33.2 g/dL (ref 31.5–35.7)
MCV: 86 fL (ref 79–97)
Monocytes Absolute: 0.4 10*3/uL (ref 0.1–0.9)
Monocytes: 7 %
Neutrophils Absolute: 4 10*3/uL (ref 1.4–7.0)
Neutrophils: 61 %
Platelets: 192 10*3/uL (ref 150–450)
RBC: 5.14 x10E6/uL (ref 3.77–5.28)
RDW: 13.6 % (ref 11.7–15.4)
WBC: 6.5 10*3/uL (ref 3.4–10.8)

## 2020-12-03 LAB — HEMOGLOBIN A1C
Est. average glucose Bld gHb Est-mCnc: 163 mg/dL
Hgb A1c MFr Bld: 7.3 % — ABNORMAL HIGH (ref 4.8–5.6)

## 2020-12-03 LAB — COMPREHENSIVE METABOLIC PANEL
ALT: 16 IU/L (ref 0–32)
AST: 25 IU/L (ref 0–40)
Albumin/Globulin Ratio: 1.9 (ref 1.2–2.2)
Albumin: 4.3 g/dL (ref 3.8–4.8)
Alkaline Phosphatase: 96 IU/L (ref 44–121)
BUN/Creatinine Ratio: 20 (ref 12–28)
BUN: 18 mg/dL (ref 8–27)
Bilirubin Total: 0.2 mg/dL (ref 0.0–1.2)
CO2: 28 mmol/L (ref 20–29)
Calcium: 10.2 mg/dL (ref 8.7–10.3)
Chloride: 100 mmol/L (ref 96–106)
Creatinine, Ser: 0.89 mg/dL (ref 0.57–1.00)
Globulin, Total: 2.3 g/dL (ref 1.5–4.5)
Glucose: 90 mg/dL (ref 65–99)
Potassium: 5.2 mmol/L (ref 3.5–5.2)
Sodium: 142 mmol/L (ref 134–144)
Total Protein: 6.6 g/dL (ref 6.0–8.5)
eGFR: 71 mL/min/{1.73_m2} (ref >59)

## 2020-12-03 LAB — LIPID PANEL
Chol/HDL Ratio: 2.4 ratio (ref 0.0–4.4)
Cholesterol, Total: 146 mg/dL (ref 100–199)
HDL: 60 mg/dL (ref >39)
LDL Chol Calc (NIH): 68 mg/dL (ref 0–99)
Triglycerides: 99 mg/dL (ref 0–149)
VLDL Cholesterol Cal: 18 mg/dL (ref 5–40)

## 2020-12-03 LAB — CARDIOVASCULAR RISK ASSESSMENT

## 2020-12-11 ENCOUNTER — Telehealth: Payer: Self-pay

## 2020-12-11 ENCOUNTER — Telehealth: Payer: Self-pay | Admitting: Family Medicine

## 2020-12-11 NOTE — Chronic Care Management (AMB) (Cosign Needed)
Chronic Care Management Pharmacy Assistant   Name: Christy Crawford  MRN: [id number] DOB: 1955/09/30  Reason for Encounter: Medication Coordination   Recent office visits:  12/02/20- [name], MD- seen for chronic conditions, labs ordered, bone density ordered, mammogram ordered, cologaurd ordered, follow up 3 months   Recent consult visits:  No visits noted   Hospital visits:  None in previous 6 months  Medications: Outpatient Encounter Medications as of 12/11/2020  Medication Sig Note   acetaminophen (TYLENOL) 325 MG tablet Take 325 mg by mouth every 6 (six) hours as needed.    ALPRAZolam (XANAX) 1 MG tablet Take 1 mg by mouth in the morning, at noon, in the evening, and at bedtime.     amitriptyline (ELAVIL) 25 MG tablet Take 1 tablet (25 mg total) by mouth at bedtime.    aspirin-acetaminophen-caffeine (EXCEDRIN MIGRAINE) 250-250-65 MG tablet Take 2 tablets by mouth in the morning.    Clever Choice Lancets 28G MISC E11.4 Use new lancet each time when checking blood sugar    COMFORT EZ PEN NEEDLES 31G X 5 MM MISC E11.44 use new needle with each new injection    Dulaglutide (TRULICITY) 4.5 0000000 SOPN Inject 3 mg into the skin once a week. Patient unable to tolerate increase to 4.5 mg. She resumed 3 mg dose on her own. 10/13/2020: Patient unable to tolerate increase to 4.5 mg. She resumed 3 mg dose on her own. Medication comes from [facility]    furosemide (LASIX) 20 MG tablet TAKE ONE TABLET BY MOUTH ONCE DAILY    GEMTESA 75 MG TABS TAKE ONE TABLET BY MOUTH DAILY    glucose blood (CLEVER CHOICE MICRO TEST) test strip E11.44 Use new strip each time when checking blood sugar. Check sugar 1-3 times daily.    insulin degludec (TRESIBA FLEXTOUCH) 100 UNIT/ML FlexTouch Pen Inject 65 Units into the skin daily.    lisinopril (ZESTRIL) 40 MG tablet TAKE 1 TABLET BY MOUTH EVERY MORNING AND1 TABLET BY MOUTH AT BEDTIME    montelukast (SINGULAIR) 10 MG tablet TAKE ONE TABLET BY MOUTH EVERYDAY AT  BEDTIME    Multiple Vitamins-Minerals (CENTRUM SILVER PO) Take 1 tablet by mouth daily.    pregabalin (LYRICA) 300 MG capsule Take 1 capsule (300 mg total) by mouth every evening.    rosuvastatin (CRESTOR) 10 MG tablet TAKE ONE TABLET BY MOUTH ONCE DAILY. Due FOR FOLLOWING UP office visits AND labs. No refills UNTIL seen    sertraline (ZOLOFT) 100 MG tablet Take 1 tablet (100 mg total) by mouth 2 (two) times daily. (Patient taking differently: Take 200 mg by mouth daily.)    Vitamin D, Cholecalciferol, 25 MCG (1000 UT) CAPS Take 2,000 Int'l Units by mouth.    No facility-administered encounter medications on file as of 12/11/2020.    Reviewed chart for medication changes ahead of medication coordination call.  No OVs, Consults, or hospital visits since last care coordination call/Pharmacist visit. (If appropriate, list visit date, provider name)  No medication changes indicated OR if recent visit, treatment plan here.  BP Readings from Last 3 Encounters:  12/02/20 128/60  08/18/20 138/84  05/12/20 120/70    Lab Results  Component Value Date   HGBA1C 7.3 (H) 12/02/2020    Several unsuccessful attempts made to contact patient  Patient obtains medications through Adherence Packaging  90 Days   Last adherence delivery included:  Alprazolam  '1mg'$  4 times a day Pregabalin '300mg'$  in evening Montelukast '10mg'$  at bedtime Lisinopril '40mg'$  BID  Rosuvastatin '10mg'$  daily Amitriptyline '25mg'$  at bedtime Gemtesa '75mg'$  Sertraline '100mg'$  BID   Patient is due for next adherence delivery on: Marland Kitchen Called patient and reviewed medications and coordinated delivery.  This delivery to include: Patient will need a short fill of (med), prior to adherence delivery. (To align with sync date or if PRN med)  Coordinated acute fill for (med) to be delivered (date).  Patient declined the following medications (meds) due to (reason)  Patient needs refills for .  Confirmed delivery date of , advised patient that  pharmacy will contact them the morning of delivery.  Wilford Sports CPA, CMA

## 2020-12-11 NOTE — Telephone Encounter (Signed)
   Christy Crawford has been scheduled for the following appointment:  WHAT: MAMMOGRAM & BONE DENSITY WHERE: [facility] DATE: 02/06/21 TIME: 10:00 AM ARRIVAL TIME  A message has been left for the patient. DO NOT TAKE AND MULTIVITAMINS, CALCIUM SUPPLEMENTS, OR ANTACIDS FOR 24 HOURS PRIOR TO APPT.

## 2020-12-13 ENCOUNTER — Other Ambulatory Visit: Payer: Self-pay | Admitting: Family Medicine

## 2020-12-16 ENCOUNTER — Ambulatory Visit (INDEPENDENT_AMBULATORY_CARE_PROVIDER_SITE_OTHER): Payer: Medicare Other

## 2020-12-16 VITALS — BP 130/72

## 2020-12-16 DIAGNOSIS — Z0000 Encounter for general adult medical examination without abnormal findings: Secondary | ICD-10-CM

## 2020-12-16 NOTE — Progress Notes (Signed)
Subjective:   Christy Crawford is a 65 y.o. female who presents for Medicare Annual (Subsequent) preventive examination.  This wellness visit is conducted by a nurse.  The patient's medications were reviewed and reconciled since the patient's last visit.  History details were provided by the patient.  The history appears to be reliable.    I connected with  Christy Crawford on 12/16/20 by an audio enabled telemedicine application and verified that I am speaking with the correct person using two identifiers.   I discussed the limitations of evaluation and management by telemedicine. The patient expressed understanding and agreed to proceed.  Present in the visit today was Christy Crawford (patient) from home, and Christy Hammock, LPN in the office.  No prior AWV record on file. Medical History: Patient history and Family history was reviewed  Medications, Allergies, and preventative health maintenance was reviewed and updated.   Cardiac Risk Factors include: advanced age (>55men, >63 women);diabetes mellitus;dyslipidemia;obesity (BMI >30kg/m2)     Objective:    Today's Vitals   12/16/20 1541  BP: 130/72  PainSc: 4    There is no height or weight on file to calculate BMI.  Advanced Directives 12/16/2020 07/09/2019  Does Patient Have a Medical Advance Directive? No Yes  Type of Advance Directive - Living will  Would patient like information on creating a medical advance directive? Yes (MAU/Ambulatory/Procedural Areas - Information given) -    Current Medications (verified) Outpatient Encounter Medications as of 12/16/2020  Medication Sig   acetaminophen (TYLENOL) 325 MG tablet Take 325 mg by mouth every 6 (six) hours as needed.   ALPRAZolam (XANAX) 1 MG tablet Take 1 mg by mouth in the morning, at noon, in the evening, and at bedtime.    amitriptyline (ELAVIL) 25 MG tablet Take 1 tablet (25 mg total) by mouth at bedtime.   aspirin-acetaminophen-caffeine (EXCEDRIN MIGRAINE) 250-250-65 MG tablet Take  2 tablets by mouth in the morning.   Clever Choice Lancets 28G MISC E11.4 Use new lancet each time when checking blood sugar   COMFORT EZ PEN NEEDLES 31G X 5 MM MISC E11.44 use new needle with each new injection   Dulaglutide (TRULICITY) 4.66 YO/3.7CH SOPN Inject 3 mg into the skin once a week. Patient unable to tolerate increase to 4.5 mg. She resumed 3 mg dose on her own.   furosemide (LASIX) 20 MG tablet TAKE ONE TABLET BY MOUTH ONCE DAILY   GEMTESA 75 MG TABS TAKE ONE TABLET BY MOUTH DAILY   glucose blood (CLEVER CHOICE MICRO TEST) test strip E11.44 Use new strip each time when checking blood sugar. Check sugar 1-3 times daily.   insulin degludec (TRESIBA FLEXTOUCH) 100 UNIT/ML FlexTouch Pen Inject 65 Units into the skin daily.   lisinopril (ZESTRIL) 40 MG tablet TAKE 1 TABLET BY MOUTH EVERY MORNING AND1 TABLET BY MOUTH AT BEDTIME   montelukast (SINGULAIR) 10 MG tablet TAKE ONE TABLET BY MOUTH EVERYDAY AT BEDTIME   Multiple Vitamins-Minerals (CENTRUM SILVER PO) Take 1 tablet by mouth daily.   NOVOFINE PLUS PEN NEEDLE 32G X 4 MM MISC    pregabalin (LYRICA) 300 MG capsule TAKE ONE CAPSULE BY MOUTH EVERY EVENING   rosuvastatin (CRESTOR) 10 MG tablet TAKE ONE TABLET BY MOUTH ONCE DAILY. Due FOR FOLLOWING UP office visits AND labs. No refills UNTIL seen   sertraline (ZOLOFT) 100 MG tablet Take 1 tablet (100 mg total) by mouth 2 (two) times daily. (Patient taking differently: Take 200 mg by mouth daily.)   Vitamin  D, Cholecalciferol, 25 MCG (1000 UT) CAPS Take 2,000 Int'l Units by mouth.   No facility-administered encounter medications on file as of 12/16/2020.    Allergies (verified) Patient has no known allergies.   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 ([facility])    Essential hypertension     Fibromyalgia    Generalized edema    Hyperlipidemia    Hypertension    Major depression    Migraine headache    Mixed hyperlipidemia    Sleep apnea    Telogen effluvium    Past Surgical History:  Procedure Laterality Date   CHOLECYSTECTOMY     lens implants Bilateral    Family History  Adopted: Yes  Problem Relation Age of Onset   Diabetes Maternal Aunt    Social History   Socioeconomic History   Marital status: Married    Spouse name: Not on file   Number of children: 1  Occupational History   Occupation: disabled  Tobacco Use   Smoking status: Former    Types: Cigarettes    Quit date: 2000    Years since quitting: 22.7   Smokeless tobacco: Never  Vaping Use   Vaping Use: Never used  Substance and Sexual Activity   Alcohol use: Yes    Alcohol/week: 1.0 standard drink    Types: 1 Glasses of wine per week    Comment: socially   Drug use: Never   Sexual activity: Not on file   Social Determinants of Health   Financial Resource Strain: Not on file  Food Insecurity: Not on file  Transportation Needs: Not on file  Physical Activity: Not on file  Stress: Not on file  Social Connections: Not on file    Tobacco Counseling Counseling given: Patient does not currently use tobacco products   Clinical Intake:  Pre-visit preparation completed: Yes Pain : 0-10 Pain Score: 4  Pain Type: Chronic pain Pain Frequency: Constant Pain Relieving Factors: Lyrica Patient has pain daily, some days tolerable  Diabetes: Yes (A1C 7.3) CBG done?: No Did pt. bring in CBG monitor from home?: No (Patient reports 81 this morning) How often do you need to have someone help you when you read instructions, pamphlets, or other written materials from your doctor or pharmacy?: 1 - Never Interpreter Needed?: No   Activities of Daily Living In your present state of health, do you have any difficulty performing the following activities: 12/16/2020 01/01/2020  Hearing? N N  Vision? Y  N  Comment positive retinopathy -  Difficulty concentrating or making decisions? N N  Walking or climbing stairs? Y Y  Dressing or bathing? Christy Crawford  Comment assist required for transfers into and out of shower -  Doing errands, shopping? Christy Crawford  Preparing Food and eating ? N -  Using the Toilet? N -  In the past six months, have you accidently leaked urine? Y -  Comment Christy Crawford has improved symptoms -  Do you have problems with loss of bowel control? N -  Managing your Medications? N -  Managing your Finances? N -  Housekeeping or managing your Housekeeping? N -  Some recent data might be hidden    Patient Care Team: [name], MD as PCP - General (Family Medicine) Marcial Pacas, MD as Consulting Physician (Neurology) Chucky May, MD as Consulting  Physician (Psychiatry) Lynnell Dike, OD (Optometry) Burnice Logan, [facility] as Pharmacist (Pharmacist)     Assessment:   This is a routine wellness examination for [place].  Dietary issues and exercise activities discussed: Current Exercise Habits: The patient does not participate in regular exercise at present, Exercise limited by: Other - see comments (mobility)  Patient does chair exercises to maintain   Goals Addressed             This Visit's Progress    Monitor and Manage My Blood Sugar-Diabetes Type 2   On track    Timeframe:  Long-Range Goal Priority:  High Start Date:        05/29/2020                     Expected End Date:  05/29/2021                     Follow Up Date 09/26/2020    - check blood sugar at prescribed times - check blood sugar if I feel it is too high or too low - enter blood sugar readings and medication or insulin into daily log - take the blood sugar log to all doctor visits    Why is this important?   Checking your blood sugar at home helps to keep it from getting very high or very low.  Writing the results in a diary or log helps the doctor know how to care for you.  Your blood sugar log should  have the time, date and the results.  Also, write down the amount of insulin or other medicine that you take.  Other information, like what you ate, exercise done and how you were feeling, will also be helpful.     Notes:        Depression Screen PHQ 2/9 Scores 12/16/2020 12/02/2020 08/18/2020 05/29/2020 05/12/2020  PHQ - 2 Score 0 0 2 0 0  PHQ- 9 Score - - 8 - -    Fall Risk Fall Risk  12/16/2020 12/02/2020 08/18/2020 05/12/2020 07/11/2019  Falls in the past year? 1 0 $R'1 1 1  'cu$ Number falls in past yr: 1 0 1 1 0  Injury with Fall? 0 0 1 1 0  Risk for fall due to : History of fall(s);Impaired mobility History of fall(s) Impaired balance/gait;Impaired mobility;History of fall(s) - Impaired mobility;History of fall(s);Impaired balance/gait;Orthopedic patient  Follow up Falls evaluation completed;Falls prevention discussed;Education provided Falls evaluation completed - - Falls evaluation completed;Falls prevention discussed   Multiple falls in October 2021 due to hypoglycemia.  One recent fall going into home due to a step up from the ramp into the home  Wall:  Home free of loose throw rugs in walkways, pet beds, electrical cords, etc? Yes  Adequate lighting in your home to reduce risk of falls? Yes   ASSISTIVE DEVICES UTILIZED TO PREVENT FALLS:  Use of a cane, walker or w/c? Yes  Grab bars in the bathroom? Yes  Shower chair or bench in shower? Yes   Cognitive Function:     6CIT Screen 12/16/2020  What Year? 0 points  What month? 0 points  What time? 0 points  Count back from 20 0 points  Months in reverse 2 points  Repeat phrase 0 points  Total Score 2    Immunizations Immunization History  Administered Date(s) Administered   Fluad Quad(high Dose 65+) 01/01/2020, 12/02/2020   Influenza-Unspecified 08/25/2017, 02/20/2018, 02/12/2019  Pneumococcal Polysaccharide-23 01/01/2020    TDAP status: Due, Education has been provided regarding the  importance of this vaccine. Advised may receive this vaccine at local pharmacy or Health Dept. Aware to provide a copy of the vaccination record if obtained from local pharmacy or Health Dept. Verbalized acceptance and understanding.  Flu Vaccine status: Up to date  Pneumococcal vaccine status: Up to date  Covid-19 vaccine status: Declined, Education has been provided regarding the importance of this vaccine but patient still declined. Advised may receive this vaccine at local pharmacy or Health Dept.or vaccine clinic. Aware to provide a copy of the vaccination record if obtained from local pharmacy or Health Dept. Verbalized acceptance and understanding.  Qualifies for Shingles Vaccine? Yes   Zostavax completed No   Shingrix Completed?: No.    Education has been provided regarding the importance of this vaccine. Patient has been advised to call insurance company to determine out of pocket expense if they have not yet received this vaccine. Advised may also receive vaccine at local pharmacy or Health Dept. Verbalized acceptance and understanding.  Screening Tests Health Maintenance  Topic Date Due   COVID-19 Vaccine (1) Never done   Hepatitis C Screening  Never done   TETANUS/TDAP  Never done   Zoster Vaccines- Shingrix (1 of 2) Never done   Fecal DNA (Cologuard)  Never done   DEXA SCAN  Never done   MAMMOGRAM  03/31/2020   PNA vac Low Risk Adult (2 of 2 - PCV13) 12/31/2020   OPHTHALMOLOGY EXAM  05/07/2021   HEMOGLOBIN A1C  06/02/2021   FOOT EXAM  08/18/2021   INFLUENZA VACCINE  Completed   HPV VACCINES  Aged Out    Health Maintenance  Health Maintenance Due  Topic Date Due   COVID-19 Vaccine (1) Never done   Hepatitis C Screening  Never done   TETANUS/TDAP  Never done   Zoster Vaccines- Shingrix (1 of 2) Never done   Fecal DNA (Cologuard)  Never done   DEXA SCAN  Never done   MAMMOGRAM  03/31/2020    Colorectal cancer screening: Type of screening: Cologuard. Patient has  kit in home to complete.  Mammogram status: Ordered, scheduled 02/06/21  Bone Density status: Ordered, scheduled 02/06/21  Lung Cancer Screening: (Low Dose CT Chest recommended if Age 58-80 years, 30 pack-year currently smoking OR have quit w/in 15years.) does not qualify.    Additional Screening:  Vision Screening: Recommended annual ophthalmology exams for early detection of glaucoma and other disorders of the eye. Is the patient up to date with their annual eye exam?  Yes    Dental Screening: Recommended annual dental exams for proper oral hygiene    Plan:    1- Mammogram and bone density screening have been scheduled for 02/06/21 at [facility] - complete and return sample 3- Pneumonia vaccine - due 12/31/20 4- Continue chair exercises  5- Continue healthy diet 6- Medication Assistance - call our office if the out of pocket amount is unaffordable.  (Oxybutynin not effective, patient started on Gemtesa recently and reports medication is very effective) 7- Yearly eye exams - last done 05/2020 8- COVID Vaccine series and booster recommended 9- Advance Directive - complete and bring a copy for our records.  If you need assistance or have any questions please call me, I will be glad to help you.  [place] are good resources as well.  I have personally reviewed and noted the following in the  patient's chart:   Medical and social history Use of alcohol, tobacco or illicit drugs  Current medications and supplements including opioid prescriptions.  Functional ability and status Nutritional status Physical activity Advanced directives List of other physicians Hospitalizations, surgeries, and ER visits in previous 12 months Vitals - Blood Pressure (BP reported by patient) Screenings to include cognitive, depression, and falls Referrals and appointments  In addition, I have reviewed and discussed with patient certain preventive  protocols, quality metrics, and best practice recommendations. A written personalized care plan for preventive services as well as general preventive health recommendations were provided to patient.     Erie Noe, LPN   0/13/1438

## 2020-12-16 NOTE — Patient Instructions (Signed)
Health Maintenance, Female Adopting a healthy lifestyle and getting preventive care are important in promoting health and wellness. Ask your health care provider about: The right schedule for you to have regular tests and exams. Things you can do on your own to prevent diseases and keep yourself healthy. What should I know about diet, weight, and exercise? Eat a healthy diet  Eat a diet that includes plenty of vegetables, fruits, low-fat dairy products, and lean protein. Do not eat a lot of foods that are high in solid fats, added sugars, or sodium. Maintain a healthy weight Body mass index (BMI) is used to identify weight problems. It estimates body fat based on height and weight. Your health care provider can help determine your BMI and help you achieve or maintain a healthy weight. Get regular exercise Get regular exercise. This is one of the most important things you can do for your health. Most adults should: Exercise for at least 150 minutes each week. The exercise should increase your heart rate and make you sweat (moderate-intensity exercise). Do strengthening exercises at least twice a week. This is in addition to the moderate-intensity exercise. Spend less time sitting. Even light physical activity can be beneficial. Watch cholesterol and blood lipids Have your blood tested for lipids and cholesterol at 66 years of age, then have this test every 5 years. Have your cholesterol levels checked more often if: Your lipid or cholesterol levels are high. You are older than 66 years of age. You are at high risk for heart disease. What should I know about cancer screening? Depending on your health history and family history, you may need to have cancer screening at various ages. This may include screening for: Breast cancer. Cervical cancer. Colorectal cancer. Skin cancer. Lung cancer. What should I know about heart disease, diabetes, and high blood pressure? Blood pressure and heart  disease High blood pressure causes heart disease and increases the risk of stroke. This is more likely to develop in people who have high blood pressure readings, are of African descent, or are overweight. Have your blood pressure checked: Every 3-5 years if you are 18-39 years of age. Every year if you are 40 years old or older. Diabetes Have regular diabetes screenings. This checks your fasting blood sugar level. Have the screening done: Once every three years after age 40 if you are at a normal weight and have a low risk for diabetes. More often and at a younger age if you are overweight or have a high risk for diabetes. What should I know about preventing infection? Hepatitis B If you have a higher risk for hepatitis B, you should be screened for this virus. Talk with your health care provider to find out if you are at risk for hepatitis B infection. Hepatitis C Testing is recommended for: Everyone born from 1945 through 1965. Anyone with known risk factors for hepatitis C. Sexually transmitted infections (STIs) Get screened for STIs, including gonorrhea and chlamydia, if: You are sexually active and are younger than 66 years of age. You are older than 66 years of age and your health care provider tells you that you are at risk for this type of infection. Your sexual activity has changed since you were last screened, and you are at increased risk for chlamydia or gonorrhea. Ask your health care provider if you are at risk. Ask your health care provider about whether you are at high risk for HIV. Your health care provider may recommend a prescription medicine   to help prevent HIV infection. If you choose to take medicine to prevent HIV, you should first get tested for HIV. You should then be tested every 3 months for as long as you are taking the medicine. Pregnancy If you are about to stop having your period (premenopausal) and you may become pregnant, seek counseling before you get  pregnant. Take 400 to 800 micrograms (mcg) of folic acid every day if you become pregnant. Ask for birth control (contraception) if you want to prevent pregnancy. Osteoporosis and menopause Osteoporosis is a disease in which the bones lose minerals and strength with aging. This can result in bone fractures. If you are 65 years old or older, or if you are at risk for osteoporosis and fractures, ask your health care provider if you should: Be screened for bone loss. Take a calcium or vitamin D supplement to lower your risk of fractures. Be given hormone replacement therapy (HRT) to treat symptoms of menopause. Follow these instructions at home: Lifestyle Do not use any products that contain nicotine or tobacco, such as cigarettes, e-cigarettes, and chewing tobacco. If you need help quitting, ask your health care provider. Do not use street drugs. Do not share needles. Ask your health care provider for help if you need support or information about quitting drugs. Alcohol use Do not drink alcohol if: Your health care provider tells you not to drink. You are pregnant, may be pregnant, or are planning to become pregnant. If you drink alcohol: Limit how much you use to 0-1 drink a day. Limit intake if you are breastfeeding. Be aware of how much alcohol is in your drink. In the [place] one drink equals one 12 oz bottle of beer (355 mL), one 5 oz glass of wine (148 mL), or one 1 oz glass of hard liquor (44 mL). General instructions Schedule regular health, dental, and eye exams. Stay current with your vaccines. Tell your health care provider if: You often feel depressed. You have ever been abused or do not feel safe at home. Summary Adopting a healthy lifestyle and getting preventive care are important in promoting health and wellness. Follow your health care provider's instructions about healthy diet, exercising, and getting tested or screened for diseases. Follow your health care provider's  instructions on monitoring your cholesterol and blood pressure. This information is not intended to replace advice given to you by your health care provider. Make sure you discuss any questions you have with your health care provider. Document Revised: 05/30/2020 Document Reviewed: 03/15/2018 Elsevier Patient Education  2022 [employer]  

## 2020-12-17 ENCOUNTER — Other Ambulatory Visit: Payer: Self-pay | Admitting: Family Medicine

## 2020-12-17 DIAGNOSIS — Z1211 Encounter for screening for malignant neoplasm of colon: Secondary | ICD-10-CM | POA: Diagnosis not present

## 2020-12-18 ENCOUNTER — Telehealth: Payer: Self-pay

## 2020-12-18 NOTE — Telephone Encounter (Signed)
Prior auth was approved.

## 2020-12-23 ENCOUNTER — Telehealth: Payer: Self-pay

## 2020-12-23 NOTE — Telephone Encounter (Signed)
PA for [facility] submitted and approved via covermymeds.

## 2020-12-24 LAB — COLOGUARD: Cologuard: NEGATIVE

## 2021-01-27 DIAGNOSIS — E113313 Type 2 diabetes mellitus with moderate nonproliferative diabetic retinopathy with macular edema, bilateral: Secondary | ICD-10-CM | POA: Diagnosis not present

## 2021-01-27 DIAGNOSIS — H16103 Unspecified superficial keratitis, bilateral: Secondary | ICD-10-CM | POA: Diagnosis not present

## 2021-01-27 DIAGNOSIS — H26493 Other secondary cataract, bilateral: Secondary | ICD-10-CM | POA: Diagnosis not present

## 2021-03-08 NOTE — Progress Notes (Signed)
Subjective:  Patient ID: Christy Crawford, female    DOB: [birth year]  Age: 65 y.o. MRN: [id number]  Chief Complaint  Patient presents with   Hypertension   Hyperlipidemia   Diabetes   Diabetes:  Complications: neuropathy Glucose checking: tid Glucose logs: 90 -110 Hypoglycemia: no Most recent A1C: 7.3 Current medications: Trulicity 3 mg weekly., tresiba 65 U qhs,  Lyrica 300 mg one twice a day.  Last Eye Exam: 05/07/2020 Foot checks: daily.  Hyperlipidemia: Current medications: rosuvastatin 10 mg once daily at night.  Hypertension: Complications: diabetes Current medications: lisinopril 40 mg bid.   Depression: zoloft has helped a great deal for her depression. She has been on this for yrs.   Diet: fairly healthy.  Exercise: unable to due to neuropathy   Current Outpatient Medications on File Prior to Visit  Medication Sig Dispense Refill   acetaminophen (TYLENOL) 325 MG tablet Take 325 mg by mouth every 6 (six) hours as needed.     ALPRAZolam (XANAX) 1 MG tablet Take 1 mg by mouth in the morning, at noon, in the evening, and at bedtime.      amitriptyline (ELAVIL) 25 MG tablet TAKE ONE TABLET BY MOUTH EVERYDAY AT BEDTIME 90 tablet 1   aspirin-acetaminophen-caffeine (EXCEDRIN MIGRAINE) 250-250-65 MG tablet Take 2 tablets by mouth in the morning.     Clever Choice Lancets 28G MISC E11.4 Use new lancet each time when checking blood sugar 100 each 2   COMFORT EZ PEN NEEDLES 31G X 5 MM MISC E11.44 use new needle with each new injection 100 each 2   Dulaglutide (TRULICITY) 4.5 AC/1.6SA SOPN Inject 3 mg into the skin once a week. Patient unable to tolerate increase to 4.5 mg. She resumed 3 mg dose on her own.     furosemide (LASIX) 20 MG tablet TAKE ONE TABLET BY MOUTH ONCE DAILY 90 tablet 0   glucose blood (CLEVER CHOICE MICRO TEST) test strip E11.44 Use new strip each time when checking blood sugar. Check sugar 1-3 times daily. 100 each 2   insulin degludec (TRESIBA FLEXTOUCH) 100  UNIT/ML FlexTouch Pen Inject 65 Units into the skin daily. 60 mL 0   montelukast (SINGULAIR) 10 MG tablet TAKE ONE TABLET BY MOUTH EVERYDAY AT BEDTIME 90 tablet 1   Multiple Vitamins-Minerals (CENTRUM SILVER PO) Take 1 tablet by mouth daily.     NOVOFINE PLUS PEN NEEDLE 32G X 4 MM MISC      pregabalin (LYRICA) 300 MG capsule TAKE ONE CAPSULE BY MOUTH EVERY EVENING 90 capsule 1   rosuvastatin (CRESTOR) 10 MG tablet TAKE ONE TABLET BY MOUTH ONCE DAILY. 90 tablet 1   sertraline (ZOLOFT) 100 MG tablet Take 1 tablet (100 mg total) by mouth 2 (two) times daily. (Patient taking differently: Take 200 mg by mouth daily.) 180 tablet 1   Vitamin D, Cholecalciferol, 25 MCG (1000 UT) CAPS Take 2,000 Int'l Units by mouth.     No current facility-administered medications on file prior to visit.   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)    Essential hypertension    Fibromyalgia    Generalized edema    Hyperlipidemia    Hypertension    Major depression    Migraine headache    Mixed hyperlipidemia    Sleep apnea    Telogen effluvium  Past Surgical History:  Procedure Laterality Date   CHOLECYSTECTOMY     lens implants Bilateral     Family History  Adopted: Yes  Problem Relation Age of Onset   Diabetes Maternal Aunt    Social History   Socioeconomic History   Marital status: Married    Spouse name: Not on file   Number of children: 1   Years of education: Not on file   Highest education level: Not on file  Occupational History   Occupation: disabled  Tobacco Use   Smoking status: Former    Types: Cigarettes    Quit date: 2000    Years since quitting: 22.9   Smokeless tobacco: Never  Vaping Use   Vaping Use: Never used  Substance and Sexual Activity   Alcohol use: Yes    Alcohol/week: 1.0  standard drink    Types: 1 Glasses of wine per week    Comment: socially   Drug use: Never   Sexual activity: Not on file  Other Topics Concern   Not on file  Social History Narrative   Not on file   Social Determinants of Health   Financial Resource Strain: Not on file  Food Insecurity: Not on file  Transportation Needs: Not on file  Physical Activity: Not on file  Stress: Not on file  Social Connections: Not on file    Review of Systems  Constitutional:  Negative for appetite change, fatigue and fever.  HENT:  Negative for congestion, ear pain, sinus pressure and sore throat.   Eyes:  Negative for pain.  Respiratory:  Negative for cough, chest tightness, shortness of breath and wheezing.   Cardiovascular:  Negative for chest pain and palpitations.  Gastrointestinal:  Positive for nausea. Negative for abdominal pain, constipation, diarrhea and vomiting.  Genitourinary:  Negative for dysuria and hematuria.  Musculoskeletal:  Positive for arthralgias, back pain and myalgias. Negative for joint swelling.  Skin:  Negative for rash.  Neurological:  Positive for headaches (greatly improved.). Negative for dizziness and weakness.  Psychiatric/Behavioral:  Negative for dysphoric mood. The patient is not nervous/anxious.     Objective:  BP 114/74   Pulse 78   Temp (!) 96.7 F (35.9 C)   Resp 18   Ht 6' (1.829 m)   Wt (!) 318 lb (144.2 kg)   BMI 43.13 kg/m   BP/Weight 03/09/2021 12/16/2020 3/00/7622  Systolic BP 633 354 562  Diastolic BP 74 72 60  Wt. (Lbs) 318 - -  BMI 43.13 - -    Physical Exam Vitals reviewed.  Constitutional:      Appearance: Normal appearance. She is normal weight.  Neck:     Vascular: No carotid bruit.  Cardiovascular:     Rate and Rhythm: Normal rate and regular rhythm.     Pulses: Normal pulses.     Heart sounds: Normal heart sounds.  Pulmonary:     Effort: Pulmonary effort is normal. No respiratory distress.     Breath sounds: Normal  breath sounds.  Abdominal:     General: Abdomen is flat. Bowel sounds are normal.     Palpations: Abdomen is soft.     Tenderness: There is no abdominal tenderness.  Musculoskeletal:        General: Tenderness (FM trigger points.) present.  Neurological:     Mental Status: She is alert and oriented to person, place, and time.  Psychiatric:        Mood and Affect: Mood normal.  Behavior: Behavior normal.    Diabetic Foot Exam - Simple   Simple Foot Form Visual Inspection No deformities, no ulcerations, no other skin breakdown bilaterally: Yes Sensation Testing See comments: Yes Pulse Check Posterior Tibialis and Dorsalis pulse intact bilaterally: Yes Comments Distorted sensation bl feet      Lab Results  Component Value Date   WBC 7.4 03/09/2021   HGB 15.6 03/09/2021   HCT 46.8 (H) 03/09/2021   PLT 219 03/09/2021   GLUCOSE 79 03/09/2021   CHOL 176 03/09/2021   TRIG 156 (H) 03/09/2021   HDL 64 03/09/2021   LDLCALC 85 03/09/2021   ALT 20 03/09/2021   AST 33 03/09/2021   NA 142 03/09/2021   K 5.1 03/09/2021   CL 99 03/09/2021   CREATININE 0.89 03/09/2021   BUN 15 03/09/2021   CO2 27 03/09/2021   TSH 4.060 06/13/2019   HGBA1C 7.3 (H) 03/09/2021   MICROALBUR 10 12/02/2020      Assessment & Plan:   Problem List Items Addressed This Visit       Endocrine   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  Recommend CGM. Checking sugars tid.  Recommend check feet daily. Recommend annual eye exams. Medicines: Continue Trulicity 3 mg weekly., tresiba 65 U qhs,  Lyrica 300 mg one twice a day.  Continue to work on eating a healthy diet and exercise.  Labs drawn today.         Relevant Medications   Continuous Blood Gluc Receiver (DEXCOM G6 RECEIVER) DEVI   Continuous Blood Gluc Sensor (DEXCOM G6 SENSOR) MISC   Continuous Blood Gluc Transmit (DEXCOM G6 TRANSMITTER) MISC   Other Relevant Orders    Hemoglobin A1c (Completed)     Other   Mixed hyperlipidemia    Well controlled.  No changes to medicines.  Continue to work on eating a healthy diet and exercise.  Labs drawn today.        Relevant Orders   Lipid panel (Completed)   Fibromyalgia    On lyrica 300 mg bid.       Mild recurrent major depression (HCC)    The current medical regimen is effective;  continue present plan and medications.       Other Visit Diagnoses     Hypertension associated with diabetes ([place])       Relevant Orders   CBC With Diff/Platelet (Completed)   Comprehensive metabolic panel (Completed)   Need for vaccination       Relevant Orders   Pneumococcal conjugate vaccine 20-valent (Prevnar 20) (Completed)     .  Follow-up: Return in about 3 months (around 06/07/2021) for chronic fasting.  An After Visit Summary was printed and given to the patient.    I,Lauren M Auman,acting as a scribe for [name], MD.,have documented all relevant documentation on the behalf of [name], MD,as directed by  [name], MD while in the presence of [name], MD.   [name], MD [place] [phone]

## 2021-03-09 ENCOUNTER — Ambulatory Visit (INDEPENDENT_AMBULATORY_CARE_PROVIDER_SITE_OTHER): Payer: Medicare Other | Admitting: Family Medicine

## 2021-03-09 ENCOUNTER — Other Ambulatory Visit: Payer: Self-pay

## 2021-03-09 ENCOUNTER — Ambulatory Visit: Payer: Self-pay

## 2021-03-09 VITALS — BP 114/74 | HR 78 | Temp 96.7°F | Resp 18 | Ht 72.0 in | Wt 318.0 lb

## 2021-03-09 DIAGNOSIS — E1144 Type 2 diabetes mellitus with diabetic amyotrophy: Secondary | ICD-10-CM | POA: Diagnosis not present

## 2021-03-09 DIAGNOSIS — I10 Essential (primary) hypertension: Secondary | ICD-10-CM

## 2021-03-09 DIAGNOSIS — E782 Mixed hyperlipidemia: Secondary | ICD-10-CM | POA: Diagnosis not present

## 2021-03-09 DIAGNOSIS — E1159 Type 2 diabetes mellitus with other circulatory complications: Secondary | ICD-10-CM

## 2021-03-09 DIAGNOSIS — M797 Fibromyalgia: Secondary | ICD-10-CM

## 2021-03-09 DIAGNOSIS — I152 Hypertension secondary to endocrine disorders: Secondary | ICD-10-CM | POA: Diagnosis not present

## 2021-03-09 DIAGNOSIS — Z23 Encounter for immunization: Secondary | ICD-10-CM | POA: Diagnosis not present

## 2021-03-09 DIAGNOSIS — F330 Major depressive disorder, recurrent, mild: Secondary | ICD-10-CM

## 2021-03-09 MED ORDER — DEXCOM G6 SENSOR MISC: 3 refills | Status: DC

## 2021-03-09 MED ORDER — DEXCOM G6 RECEIVER DEVI: 1.0000 | Freq: Three times a day (TID) | 3 refills | Status: DC

## 2021-03-09 MED ORDER — DEXCOM G6 TRANSMITTER MISC
1.0000 | 3 refills | Status: DC
Start: 2021-03-09 — End: 2021-09-24

## 2021-03-09 NOTE — Progress Notes (Signed)
Meredith Mody informed me patient lost PAP paperwork, coordinated with staff to get re-sent out to patient

## 2021-03-10 ENCOUNTER — Telehealth: Payer: Self-pay

## 2021-03-10 LAB — COMPREHENSIVE METABOLIC PANEL
ALT: 20 IU/L (ref 0–32)
AST: 33 IU/L (ref 0–40)
Albumin/Globulin Ratio: 2 (ref 1.2–2.2)
Albumin: 4.4 g/dL (ref 3.8–4.8)
Alkaline Phosphatase: 106 IU/L (ref 44–121)
BUN/Creatinine Ratio: 17 (ref 12–28)
BUN: 15 mg/dL (ref 8–27)
Bilirubin Total: 0.3 mg/dL (ref 0.0–1.2)
CO2: 27 mmol/L (ref 20–29)
Calcium: 10.1 mg/dL (ref 8.7–10.3)
Chloride: 99 mmol/L (ref 96–106)
Creatinine, Ser: 0.89 mg/dL (ref 0.57–1.00)
Globulin, Total: 2.2 g/dL (ref 1.5–4.5)
Glucose: 79 mg/dL (ref 70–99)
Potassium: 5.1 mmol/L (ref 3.5–5.2)
Sodium: 142 mmol/L (ref 134–144)
Total Protein: 6.6 g/dL (ref 6.0–8.5)
eGFR: 71 mL/min/{1.73_m2} (ref >59)

## 2021-03-10 LAB — CBC WITH DIFF/PLATELET
Basophils Absolute: 0.1 10*3/uL (ref 0.0–0.2)
Basos: 1 %
EOS (ABSOLUTE): 0.2 10*3/uL (ref 0.0–0.4)
Eos: 3 %
Hematocrit: 46.8 % — ABNORMAL HIGH (ref 34.0–46.6)
Hemoglobin: 15.6 g/dL (ref 11.1–15.9)
Immature Grans (Abs): 0.1 10*3/uL (ref 0.0–0.1)
Immature Granulocytes: 1 %
Lymphocytes Absolute: 1.5 10*3/uL (ref 0.7–3.1)
Lymphs: 20 %
MCH: 28.5 pg (ref 26.6–33.0)
MCHC: 33.3 g/dL (ref 31.5–35.7)
MCV: 85 fL (ref 79–97)
Monocytes Absolute: 0.5 10*3/uL (ref 0.1–0.9)
Monocytes: 6 %
Neutrophils Absolute: 5.1 10*3/uL (ref 1.4–7.0)
Neutrophils: 69 %
Platelets: 219 10*3/uL (ref 150–450)
RBC: 5.48 x10E6/uL — ABNORMAL HIGH (ref 3.77–5.28)
RDW: 14.1 % (ref 11.7–15.4)
WBC: 7.4 10*3/uL (ref 3.4–10.8)

## 2021-03-10 LAB — LIPID PANEL
Chol/HDL Ratio: 2.8 ratio (ref 0.0–4.4)
Cholesterol, Total: 176 mg/dL (ref 100–199)
HDL: 64 mg/dL (ref >39)
LDL Chol Calc (NIH): 85 mg/dL (ref 0–99)
Triglycerides: 156 mg/dL — ABNORMAL HIGH (ref 0–149)
VLDL Cholesterol Cal: 27 mg/dL (ref 5–40)

## 2021-03-10 LAB — HEMOGLOBIN A1C
Est. average glucose Bld gHb Est-mCnc: 163 mg/dL
Hgb A1c MFr Bld: 7.3 % — ABNORMAL HIGH (ref 4.8–5.6)

## 2021-03-10 NOTE — Progress Notes (Signed)
Chronic Care Management Pharmacy Assistant   Name: Christy Crawford  MRN: [id number] DOB: 1955/12/05   Reason for Encounter: Medication Coordination for [facility]    Recent office visits:  None   Recent consult visits:  None   Hospital visits:  None   Medications: Outpatient Encounter Medications as of 03/10/2021  Medication Sig Note   acetaminophen (TYLENOL) 325 MG tablet Take 325 mg by mouth every 6 (six) hours as needed.    ALPRAZolam (XANAX) 1 MG tablet Take 1 mg by mouth in the morning, at noon, in the evening, and at bedtime.     amitriptyline (ELAVIL) 25 MG tablet TAKE ONE TABLET BY MOUTH EVERYDAY AT BEDTIME    aspirin-acetaminophen-caffeine (EXCEDRIN MIGRAINE) 250-250-65 MG tablet Take 2 tablets by mouth in the morning.    Clever Choice Lancets 28G MISC E11.4 Use new lancet each time when checking blood sugar    COMFORT EZ PEN NEEDLES 31G X 5 MM MISC E11.44 use new needle with each new injection    Continuous Blood Gluc Receiver (DEXCOM G6 RECEIVER) DEVI 1 each by Does not apply route in the morning, at noon, and at bedtime.    Continuous Blood Gluc Sensor (DEXCOM G6 SENSOR) MISC Check sugars qac and qhs.    Continuous Blood Gluc Transmit (DEXCOM G6 TRANSMITTER) MISC 1 each by Does not apply route every 3 (three) months.    Dulaglutide (TRULICITY) 4.5 KP/5.4SF SOPN Inject 3 mg into the skin once a week. Patient unable to tolerate increase to 4.5 mg. She resumed 3 mg dose on her own. 10/13/2020: Patient unable to tolerate increase to 4.5 mg. She resumed 3 mg dose on her own. Medication comes from [facility]    furosemide (LASIX) 20 MG tablet TAKE ONE TABLET BY MOUTH ONCE DAILY    glucose blood (CLEVER CHOICE MICRO TEST) test strip E11.44 Use new strip each time when checking blood sugar. Check sugar 1-3 times daily.    insulin degludec (TRESIBA FLEXTOUCH) 100 UNIT/ML FlexTouch Pen Inject 65 Units into the skin daily.    lisinopril (ZESTRIL) 40 MG tablet TAKE 1 TABLET BY MOUTH EVERY  MORNING AND1 TABLET BY MOUTH AT BEDTIME    montelukast (SINGULAIR) 10 MG tablet TAKE ONE TABLET BY MOUTH EVERYDAY AT BEDTIME    Multiple Vitamins-Minerals (CENTRUM SILVER PO) Take 1 tablet by mouth daily.    NOVOFINE PLUS PEN NEEDLE 32G X 4 MM MISC     pregabalin (LYRICA) 300 MG capsule TAKE ONE CAPSULE BY MOUTH EVERY EVENING    rosuvastatin (CRESTOR) 10 MG tablet TAKE ONE TABLET BY MOUTH ONCE DAILY.    sertraline (ZOLOFT) 100 MG tablet Take 1 tablet (100 mg total) by mouth 2 (two) times daily. (Patient taking differently: Take 200 mg by mouth daily.)    Vitamin D, Cholecalciferol, 25 MCG (1000 UT) CAPS Take 2,000 Int'l Units by mouth.    No facility-administered encounter medications on file as of 03/10/2021.   Reviewed chart for medication changes ahead of medication coordination call.  No OVs, Consults, or hospital visits since last care coordination call/Pharmacist visit. (If appropriate, list visit date, provider name)  No medication changes indicated OR if recent visit, treatment plan here.  BP Readings from Last 3 Encounters:  03/09/21 114/74  12/16/20 130/72  12/02/20 128/60    Lab Results  Component Value Date   HGBA1C 7.3 (H) 03/09/2021     Patient obtains medications through [facility]  90 Days   Last adherence delivery included:  Alprazolam  1mg  4 times a day Pregabalin 300mg  in evening Montelukast 10mg  at bedtime Lisinopril 40mg  BID Rosuvastatin 10mg  daily Amitriptyline 25mg  at bedtime Gemtesa 75mg  Sertraline 100mg  BID   Patient is due for next adherence delivery on: 03/20/21 . Called patient and reviewed medications and coordinated delivery.  This delivery to include: Gemtesa 75 mg 1 tab once daily Montelukast 10 mg 1 tab at bedtime Rosuvastatin 10 mg 1 tab once daily Sertraline 100 mg 2 tabs once daily Pregabalin 300 mg 1 capsule every evening Amitriptyline 25 mg 1 tab at bedtime Lisinopril 40 mg 1 tab at breakfast and bedtime Xanax 1 mg 1 tab four times  daily prn   Patient declined the following medications (meds) due to (reason)  Patient needs refills Gemtesa 75 mg Lisinopril 40 mg Xanax 1 mg  Confirmed delivery date of 03/20/21, advised patient that pharmacy will contact them the morning of delivery.  Elray Mcgregor, [place] Pharmacist Assistant  [phone]   I have made several attempt at reaching pt and all were unsuccessful

## 2021-03-13 ENCOUNTER — Other Ambulatory Visit: Payer: Self-pay | Admitting: Family Medicine

## 2021-03-14 ENCOUNTER — Other Ambulatory Visit: Payer: Self-pay | Admitting: Family Medicine

## 2021-03-14 DIAGNOSIS — N3941 Urge incontinence: Secondary | ICD-10-CM

## 2021-03-22 ENCOUNTER — Encounter: Payer: Self-pay | Admitting: Family Medicine

## 2021-03-22 DIAGNOSIS — F330 Major depressive disorder, recurrent, mild: Secondary | ICD-10-CM | POA: Insufficient documentation

## 2021-03-22 NOTE — Assessment & Plan Note (Signed)
On lyrica 300 mg bid.

## 2021-03-22 NOTE — Assessment & Plan Note (Signed)
Control: good.  Recommend CGM. Checking sugars tid.  Recommend check feet daily. Recommend annual eye exams. Medicines: Continue Trulicity 3 mg weekly., tresiba 65 U qhs,  Lyrica 300 mg one twice a day.  Continue to work on eating a healthy diet and exercise.  Labs drawn today.

## 2021-03-22 NOTE — Assessment & Plan Note (Signed)
The current medical regimen is effective;  continue present plan and medications.  

## 2021-03-22 NOTE — Assessment & Plan Note (Signed)
Well controlled.  ?No changes to medicines.  ?Continue to work on eating a healthy diet and exercise.  ?Labs drawn today.  ?

## 2021-03-24 ENCOUNTER — Telehealth: Payer: Self-pay

## 2021-03-24 NOTE — Progress Notes (Signed)
° ° °  Chronic Care Management Pharmacy Assistant   Name: Christy Crawford  MRN: [id number] DOB: [birth year]   Reason for Encounter: Patient Assistance Documentation   03/24/21 I have filled out a 2023 PAP renewal for [employer] and will be mailed out to pt to finish completing and will take to Dr. Tobie Poet office to fax. Call and left a detailed message on pt vm informing her of next steps.    Medications: Outpatient Encounter Medications as of 03/24/2021  Medication Sig Note   acetaminophen (TYLENOL) 325 MG tablet Take 325 mg by mouth every 6 (six) hours as needed.    ALPRAZolam (XANAX) 1 MG tablet Take 1 mg by mouth in the morning, at noon, in the evening, and at bedtime.     amitriptyline (ELAVIL) 25 MG tablet TAKE ONE TABLET BY MOUTH EVERYDAY AT BEDTIME    aspirin-acetaminophen-caffeine (EXCEDRIN MIGRAINE) 250-250-65 MG tablet Take 2 tablets by mouth in the morning.    Clever Choice Lancets 28G MISC E11.4 Use new lancet each time when checking blood sugar    COMFORT EZ PEN NEEDLES 31G X 5 MM MISC E11.44 use new needle with each new injection    Continuous Blood Gluc Receiver (DEXCOM G6 RECEIVER) DEVI 1 each by Does not apply route in the morning, at noon, and at bedtime.    Continuous Blood Gluc Sensor (DEXCOM G6 SENSOR) MISC Check sugars qac and qhs.    Continuous Blood Gluc Transmit (DEXCOM G6 TRANSMITTER) MISC 1 each by Does not apply route every 3 (three) months.    Dulaglutide (TRULICITY) 4.5 IZ/1.2WP SOPN Inject 3 mg into the skin once a week. Patient unable to tolerate increase to 4.5 mg. She resumed 3 mg dose on her own. 10/13/2020: Patient unable to tolerate increase to 4.5 mg. She resumed 3 mg dose on her own. Medication comes from [facility]    furosemide (LASIX) 20 MG tablet TAKE ONE TABLET BY MOUTH ONCE DAILY    GEMTESA 75 MG TABS TAKE ONE TABLET BY MOUTH ONCE DAILY    glucose blood (CLEVER CHOICE MICRO TEST) test strip E11.44 Use new strip each time when checking blood sugar. Check  sugar 1-3 times daily.    insulin degludec (TRESIBA FLEXTOUCH) 100 UNIT/ML FlexTouch Pen Inject 65 Units into the skin daily.    lisinopril (ZESTRIL) 40 MG tablet TAKE ONE TABLET BY MOUTH AT BREAKFAST AND AT BEDTIME    montelukast (SINGULAIR) 10 MG tablet TAKE ONE TABLET BY MOUTH EVERYDAY AT BEDTIME    Multiple Vitamins-Minerals (CENTRUM SILVER PO) Take 1 tablet by mouth daily.    NOVOFINE PLUS PEN NEEDLE 32G X 4 MM MISC     pregabalin (LYRICA) 300 MG capsule TAKE ONE CAPSULE BY MOUTH EVERY EVENING    rosuvastatin (CRESTOR) 10 MG tablet TAKE ONE TABLET BY MOUTH ONCE DAILY.    sertraline (ZOLOFT) 100 MG tablet Take 1 tablet (100 mg total) by mouth 2 (two) times daily. (Patient taking differently: Take 200 mg by mouth daily.)    Vitamin D, Cholecalciferol, 25 MCG (1000 UT) CAPS Take 2,000 Int'l Units by mouth.    No facility-administered encounter medications on file as of 03/24/2021.   Elray Mcgregor, [place] Pharmacist Assistant  [phone]

## 2021-05-19 ENCOUNTER — Telehealth: Payer: Self-pay

## 2021-05-19 NOTE — Telephone Encounter (Signed)
Voicemail received from patient stating that she received a notice from [employer] that she has not submitted her 2023 patient assistance application for Trulicity.  2023 patient assistance application was originally faxed 03/19/21 and was refaxed today, 05/19/21.

## 2021-05-20 ENCOUNTER — Telehealth: Payer: Self-pay

## 2021-05-20 NOTE — Chronic Care Management (AMB) (Signed)
Chronic Care Management Pharmacy Assistant   Name: Christy Crawford  MRN: [id number] DOB: [birth year]  Reason for Encounter: Patient Assistance Coordination  05/20/2021-  Patient called with questions regarding [employer] [facility] patient assistance application for Trulicity. Re-enrollment application was faxed on 03/18/21, Joelene Millin Smith,LPN refaxed patients application [phone]. [facility] is currently not accepting new applications for Trulicity due to limited availability of this medication. There seems to be a shortage on Trulicity. Called patient to inform, left message that if she is unable to receive her Trulicity through patient assistance, Dr Tobie Poet and [facility] CPP are discussing possible medication change or other options. Patient notified she will here from PCP office or CPP for follow up.    Medications: Outpatient Encounter Medications as of 05/20/2021  Medication Sig Note   acetaminophen (TYLENOL) 325 MG tablet Take 325 mg by mouth every 6 (six) hours as needed.    ALPRAZolam (XANAX) 1 MG tablet Take 1 mg by mouth in the morning, at noon, in the evening, and at bedtime.     amitriptyline (ELAVIL) 25 MG tablet TAKE ONE TABLET BY MOUTH EVERYDAY AT BEDTIME    aspirin-acetaminophen-caffeine (EXCEDRIN MIGRAINE) 250-250-65 MG tablet Take 2 tablets by mouth in the morning.    Clever Choice Lancets 28G MISC E11.4 Use new lancet each time when checking blood sugar    COMFORT EZ PEN NEEDLES 31G X 5 MM MISC E11.44 use new needle with each new injection    Continuous Blood Gluc Receiver (DEXCOM G6 RECEIVER) DEVI 1 each by Does not apply route in the morning, at noon, and at bedtime.    Continuous Blood Gluc Sensor (DEXCOM G6 SENSOR) MISC Check sugars qac and qhs.    Continuous Blood Gluc Transmit (DEXCOM G6 TRANSMITTER) MISC 1 each by Does not apply route every 3 (three) months.    Dulaglutide (TRULICITY) 4.5 NI/7.7OE SOPN Inject 3 mg into the skin once a week. Patient unable to tolerate increase  to 4.5 mg. She resumed 3 mg dose on her own. 10/13/2020: Patient unable to tolerate increase to 4.5 mg. She resumed 3 mg dose on her own. Medication comes from [facility]    furosemide (LASIX) 20 MG tablet TAKE ONE TABLET BY MOUTH ONCE DAILY    GEMTESA 75 MG TABS TAKE ONE TABLET BY MOUTH ONCE DAILY    glucose blood (CLEVER CHOICE MICRO TEST) test strip E11.44 Use new strip each time when checking blood sugar. Check sugar 1-3 times daily.    insulin degludec (TRESIBA FLEXTOUCH) 100 UNIT/ML FlexTouch Pen Inject 65 Units into the skin daily.    lisinopril (ZESTRIL) 40 MG tablet TAKE ONE TABLET BY MOUTH AT BREAKFAST AND AT BEDTIME    montelukast (SINGULAIR) 10 MG tablet TAKE ONE TABLET BY MOUTH EVERYDAY AT BEDTIME    Multiple Vitamins-Minerals (CENTRUM SILVER PO) Take 1 tablet by mouth daily.    NOVOFINE PLUS PEN NEEDLE 32G X 4 MM MISC     pregabalin (LYRICA) 300 MG capsule TAKE ONE CAPSULE BY MOUTH EVERY EVENING    rosuvastatin (CRESTOR) 10 MG tablet TAKE ONE TABLET BY MOUTH ONCE DAILY.    sertraline (ZOLOFT) 100 MG tablet Take 1 tablet (100 mg total) by mouth 2 (two) times daily. (Patient taking differently: Take 200 mg by mouth daily.)    Vitamin D, Cholecalciferol, 25 MCG (1000 UT) CAPS Take 2,000 Int'l Units by mouth.    No facility-administered encounter medications on file as of 05/20/2021.    Pattricia Boss, [place] Pharmacist Assistant  [phone] ° °

## 2021-05-21 ENCOUNTER — Telehealth: Payer: Self-pay

## 2021-05-21 NOTE — Telephone Encounter (Signed)
Will ask PCP about using Metformin instead

## 2021-05-21 NOTE — Chronic Care Management (AMB) (Addendum)
° ° °  Chronic Care Management Pharmacy Assistant   Name: Christy Crawford  MRN: [id number] DOB: [birth year]   Reason for Encounter: Patient Assistance Coordination  06/01/2021- Patient denied for Trulicity patient assistance with [place] Patient assistance program due to shortage of Trulicity and [employer] is no longer accepting new patient applications. Dr Tobie Poet suggested Rybelsus 7 mg and sent prescription to the pharmacy, filled out new medication/change request form for [employer] for Rybelsus, awaiting provider signature, will fax to [employer]. LM on patient voicemail to return call.     Medications: Outpatient Encounter Medications as of 05/21/2021  Medication Sig Note   acetaminophen (TYLENOL) 325 MG tablet Take 325 mg by mouth every 6 (six) hours as needed.    ALPRAZolam (XANAX) 1 MG tablet Take 1 mg by mouth in the morning, at noon, in the evening, and at bedtime.     amitriptyline (ELAVIL) 25 MG tablet TAKE ONE TABLET BY MOUTH EVERYDAY AT BEDTIME    aspirin-acetaminophen-caffeine (EXCEDRIN MIGRAINE) 250-250-65 MG tablet Take 2 tablets by mouth in the morning.    Clever Choice Lancets 28G MISC E11.4 Use new lancet each time when checking blood sugar    COMFORT EZ PEN NEEDLES 31G X 5 MM MISC E11.44 use new needle with each new injection    Continuous Blood Gluc Receiver (DEXCOM G6 RECEIVER) DEVI 1 each by Does not apply route in the morning, at noon, and at bedtime.    Continuous Blood Gluc Sensor (DEXCOM G6 SENSOR) MISC Check sugars qac and qhs.    Continuous Blood Gluc Transmit (DEXCOM G6 TRANSMITTER) MISC 1 each by Does not apply route every 3 (three) months.    Dulaglutide (TRULICITY) 4.5 TK/3.5WS SOPN Inject 3 mg into the skin once a week. Patient unable to tolerate increase to 4.5 mg. She resumed 3 mg dose on her own. 10/13/2020: Patient unable to tolerate increase to 4.5 mg. She resumed 3 mg dose on her own. Medication comes from [facility]    furosemide (LASIX) 20 MG tablet TAKE  ONE TABLET BY MOUTH ONCE DAILY    GEMTESA 75 MG TABS TAKE ONE TABLET BY MOUTH ONCE DAILY    glucose blood (CLEVER CHOICE MICRO TEST) test strip E11.44 Use new strip each time when checking blood sugar. Check sugar 1-3 times daily.    insulin degludec (TRESIBA FLEXTOUCH) 100 UNIT/ML FlexTouch Pen Inject 65 Units into the skin daily.    lisinopril (ZESTRIL) 40 MG tablet TAKE ONE TABLET BY MOUTH AT BREAKFAST AND AT BEDTIME    montelukast (SINGULAIR) 10 MG tablet TAKE ONE TABLET BY MOUTH EVERYDAY AT BEDTIME    Multiple Vitamins-Minerals (CENTRUM SILVER PO) Take 1 tablet by mouth daily.    NOVOFINE PLUS PEN NEEDLE 32G X 4 MM MISC     pregabalin (LYRICA) 300 MG capsule TAKE ONE CAPSULE BY MOUTH EVERY EVENING    rosuvastatin (CRESTOR) 10 MG tablet TAKE ONE TABLET BY MOUTH ONCE DAILY.    sertraline (ZOLOFT) 100 MG tablet Take 1 tablet (100 mg total) by mouth 2 (two) times daily. (Patient taking differently: Take 200 mg by mouth daily.)    Vitamin D, Cholecalciferol, 25 MCG (1000 UT) CAPS Take 2,000 Int'l Units by mouth.    No facility-administered encounter medications on file as of 05/21/2021.    Pattricia Boss, [place] Pharmacist Assistant [phone]

## 2021-05-24 ENCOUNTER — Other Ambulatory Visit: Payer: Self-pay | Admitting: Family Medicine

## 2021-05-24 MED ORDER — RYBELSUS 7 MG PO TABS: 7.0000 mg | ORAL_TABLET | Freq: Every day | ORAL | 0 refills | Status: DC

## 2021-05-25 ENCOUNTER — Other Ambulatory Visit: Payer: Self-pay | Admitting: Family Medicine

## 2021-05-25 MED ORDER — METFORMIN HCL 500 MG PO TABS: 500.0000 mg | ORAL_TABLET | Freq: Two times a day (BID) | ORAL | 0 refills | Status: DC

## 2021-05-28 NOTE — Telephone Encounter (Signed)
Tried to coordinate with patient about how to take Metformin and to slowly taper, unable to do so. Coordinated with [facility] to make sure med sent out (Was on 05/25/21). Left msg for patient to call me back

## 2021-06-08 ENCOUNTER — Telehealth: Payer: Self-pay

## 2021-06-08 NOTE — Chronic Care Management (AMB) (Signed)
? ? ?Chronic Care Management ?Pharmacy Assistant  ? ?Name: Christy Crawford  MRN: [id number] DOB: 07/22/1955 ? ? ?Reason for Encounter: Medication Coordination for [facility]  ?  ?Recent office visits:  ?05/25/21 [name] MD. Orders Only. Started Metformin '500mg'$ .  ? ?05/24/21 Cox, Kirsten MD. D/C Trulicity 4.'5mg'$  due to PAP denial and Started on Rybelsus '7mg'$  daily.  ? ?Recent consult visits:  ?None ? ?Hospital visits:  ?None ? ?Medications: ?Outpatient Encounter Medications as of 06/08/2021  ?Medication Sig  ? acetaminophen (TYLENOL) 325 MG tablet Take 325 mg by mouth every 6 (six) hours as needed.  ? ALPRAZolam (XANAX) 1 MG tablet Take 1 mg by mouth in the morning, at noon, in the evening, and at bedtime.   ? amitriptyline (ELAVIL) 25 MG tablet TAKE ONE TABLET BY MOUTH EVERYDAY AT BEDTIME  ? aspirin-acetaminophen-caffeine (EXCEDRIN MIGRAINE) 250-250-65 MG tablet Take 2 tablets by mouth in the morning.  ? Clever Choice Lancets 28G MISC E11.4 Use new lancet each time when checking blood sugar  ? COMFORT EZ PEN NEEDLES 31G X 5 MM MISC E11.44 use new needle with each new injection  ? Continuous Blood Gluc Receiver ([place]) DEVI 1 each by Does not apply route in the morning, at noon, and at bedtime.  ? Continuous Blood Gluc Sensor (DEXCOM G6 SENSOR) MISC Check sugars qac and qhs.  ? Continuous Blood Gluc Transmit (DEXCOM G6 TRANSMITTER) MISC 1 each by Does not apply route every 3 (three) months.  ? furosemide (LASIX) 20 MG tablet TAKE ONE TABLET BY MOUTH ONCE DAILY  ? GEMTESA 75 MG TABS TAKE ONE TABLET BY MOUTH ONCE DAILY  ? glucose blood (CLEVER CHOICE MICRO TEST) test strip E11.44 Use new strip each time when checking blood sugar. Check sugar 1-3 times daily.  ? insulin degludec (TRESIBA FLEXTOUCH) 100 UNIT/ML FlexTouch Pen Inject 65 Units into the skin daily.  ? lisinopril (ZESTRIL) 40 MG tablet TAKE ONE TABLET BY MOUTH AT BREAKFAST AND AT BEDTIME  ? metFORMIN (GLUCOPHAGE) 500 MG tablet Take 1 tablet (500 mg  total) by mouth 2 (two) times daily with a meal.  ? montelukast (SINGULAIR) 10 MG tablet TAKE ONE TABLET BY MOUTH EVERYDAY AT BEDTIME  ? Multiple Vitamins-Minerals (CENTRUM SILVER PO) Take 1 tablet by mouth daily.  ? NOVOFINE PLUS PEN NEEDLE 32G X 4 MM MISC   ? pregabalin (LYRICA) 300 MG capsule TAKE ONE CAPSULE BY MOUTH EVERY EVENING  ? rosuvastatin (CRESTOR) 10 MG tablet TAKE ONE TABLET BY MOUTH ONCE DAILY.  ? Semaglutide (RYBELSUS) 7 MG TABS Take 7 mg by mouth daily.  ? sertraline (ZOLOFT) 100 MG tablet Take 1 tablet (100 mg total) by mouth 2 (two) times daily. (Patient taking differently: Take 200 mg by mouth daily.)  ? Vitamin D, Cholecalciferol, 25 MCG (1000 UT) CAPS Take 2,000 Int'l Units by mouth.  ? ?No facility-administered encounter medications on file as of 06/08/2021.  ? ? ?Reviewed chart for medication changes ahead of medication coordination call. ? ?No OVs, Consults, or hospital visits since last care coordination call/Pharmacist visit.  ? ?BP Readings from Last 3 Encounters:  ?03/09/21 114/74  ?12/16/20 130/72  ?12/02/20 128/60  ?  ?Lab Results  ?Component Value Date  ? HGBA1C 7.3 (H) 03/09/2021  ?  ? ?Patient obtains medications through Vials  90 Days  ? ?Last adherence delivery included:  ?Gemtesa 75 mg 1 tab once daily ?Montelukast 10 mg 1 tab at bedtime ?Rosuvastatin 10 mg 1 tab once daily ?Sertraline 100  mg 2 tabs once daily ?Pregabalin 300 mg 1 capsule every evening ?Amitriptyline 25 mg 1 tab at bedtime ?Lisinopril 40 mg 1 tab at breakfast and bedtime ?Xanax 1 mg 1 tab four times daily prn  ? ?Patient declined (meds) last delivery ?Unable to reach pt  ? ?Patient is due for next adherence delivery on: 06/18/21. ?Called patient and reviewed medications and coordinated delivery. ? ?This delivery to include: ?Rybelsus '7mg'$  1 tab daily ?Gemtesa 75 mg 1 tab once daily ?Lisinopril 40 mg 1 tab at breakfast and bedtime ?Montelukast 10 mg 1 tab at bedtime ?Rosuvastatin 10 mg 1 tab once daily ?Sertraline 100  mg 2 tabs once daily ?Pregabalin 300 mg 1 capsule every evening ?Amitriptyline 25 mg 1 tab at bedtime ?Xanax 1 mg 1 tab four times daily prn  ?Prodigy No Coding Test Strips ?Prodigy No Coding Lancets  ? ?Patient declined the following medications  ?Metformin '500mg'$  -Side effects, she will see Dr. Tobie Poet next week to discuss. Sent message informing staff  ?Tyler Aas Flextouch Gets through PAP ? ?Patient needs refills -Request sent  ?Rybelsus '7mg'$  1 tab daily ?Montelukast 10 mg 1 tab at bedtime ?Rosuvastatin 10 mg 1 tab once daily ?Pregabalin 300 mg 1 capsule every evening ?Amitriptyline 25 mg 1 tab at bedtime ?Prodigy No Coding Test Strips ?Prodigy No Coding Lancets  ? ?Confirmed delivery date of 06/18/21, advised patient that pharmacy will contact them the morning of delivery. ? ? ?Elray Mcgregor, CMA ?Clinical Pharmacist Assistant  ?[phone]  ?

## 2021-06-11 ENCOUNTER — Other Ambulatory Visit: Payer: Self-pay

## 2021-06-11 ENCOUNTER — Other Ambulatory Visit: Payer: Self-pay | Admitting: Family Medicine

## 2021-06-11 MED ORDER — ROSUVASTATIN CALCIUM 10 MG PO TABS: ORAL_TABLET | ORAL | 1 refills | Status: DC

## 2021-06-11 MED ORDER — MONTELUKAST SODIUM 10 MG PO TABS: ORAL_TABLET | ORAL | 1 refills | Status: DC

## 2021-06-11 MED ORDER — PREGABALIN 300 MG PO CAPS: 300.0000 mg | ORAL_CAPSULE | Freq: Every evening | ORAL | 1 refills | Status: DC

## 2021-06-11 MED ORDER — CLEVER CHOICE MICRO TEST VI STRP: ORAL_STRIP | 2 refills | Status: DC

## 2021-06-11 MED ORDER — RYBELSUS 7 MG PO TABS: 7.0000 mg | ORAL_TABLET | Freq: Every day | ORAL | 1 refills | Status: DC

## 2021-06-11 MED ORDER — METFORMIN HCL 500 MG PO TABS: 500.0000 mg | ORAL_TABLET | Freq: Two times a day (BID) | ORAL | 1 refills | Status: DC

## 2021-06-11 MED ORDER — AMITRIPTYLINE HCL 25 MG PO TABS: 25.0000 mg | ORAL_TABLET | Freq: Every day | ORAL | 1 refills | Status: DC

## 2021-06-11 NOTE — Telephone Encounter (Addendum)
No longer taking Metformin, tried calling patient but unavailable. F/u with PCP on 06/15/21, will let PCP know and defer to them ?

## 2021-06-15 ENCOUNTER — Encounter: Payer: Self-pay | Admitting: Family Medicine

## 2021-06-15 ENCOUNTER — Other Ambulatory Visit: Payer: Self-pay

## 2021-06-15 ENCOUNTER — Ambulatory Visit (INDEPENDENT_AMBULATORY_CARE_PROVIDER_SITE_OTHER): Payer: Medicare Other | Admitting: Family Medicine

## 2021-06-15 VITALS — BP 136/82 | HR 85 | Temp 96.9°F | Ht 72.0 in | Wt 318.0 lb

## 2021-06-15 DIAGNOSIS — E1159 Type 2 diabetes mellitus with other circulatory complications: Secondary | ICD-10-CM

## 2021-06-15 DIAGNOSIS — Z13820 Encounter for screening for osteoporosis: Secondary | ICD-10-CM | POA: Diagnosis not present

## 2021-06-15 DIAGNOSIS — E1144 Type 2 diabetes mellitus with diabetic amyotrophy: Secondary | ICD-10-CM

## 2021-06-15 DIAGNOSIS — F330 Major depressive disorder, recurrent, mild: Secondary | ICD-10-CM

## 2021-06-15 DIAGNOSIS — Z1231 Encounter for screening mammogram for malignant neoplasm of breast: Secondary | ICD-10-CM | POA: Insufficient documentation

## 2021-06-15 DIAGNOSIS — Z6841 Body Mass Index (BMI) 40.0 and over, adult: Secondary | ICD-10-CM

## 2021-06-15 DIAGNOSIS — Z780 Asymptomatic menopausal state: Secondary | ICD-10-CM

## 2021-06-15 DIAGNOSIS — M797 Fibromyalgia: Secondary | ICD-10-CM | POA: Diagnosis not present

## 2021-06-15 DIAGNOSIS — E782 Mixed hyperlipidemia: Secondary | ICD-10-CM | POA: Diagnosis not present

## 2021-06-15 DIAGNOSIS — I152 Hypertension secondary to endocrine disorders: Secondary | ICD-10-CM | POA: Diagnosis not present

## 2021-06-15 DIAGNOSIS — I10 Essential (primary) hypertension: Secondary | ICD-10-CM | POA: Insufficient documentation

## 2021-06-15 NOTE — Progress Notes (Unsigned)
Subjective:  Patient ID: Christy Crawford, female    DOB: [birth year]  Age: 66 y.o. MRN: [id number]  Chief Complaint  Patient presents with   Diabetes   Hyperlipidemia   Hypertension    Diabetes:  Complications: Diabetic macular edema. Amyotropy/neuropathy. Glucose checking: Patient checks sugars three times daily. Glucose logs: 80s in am, postprandial 140s, at bedtime was 130s Hypoglycemia: once in last 3 months.  Most recent A1C: 7.3 Current medications: Patient is  still currently taking Trulicity 3 mg inject once weekly to finish up what she has left before switching to and starting Rybelsus. Also taking Tresiba 65 units qhs, Lyrica 300 mg one tablet twice daily. Last Eye Exam: 05/07/2020. Seeing every 6 months. Foot checks: Patient checks her feet daily.  Hyperlipidemia: Current medications: Patient is currently taking Rosuvastatin 10 mg take 1 tablet daily.  Hypertension: Current medications: Patient is currently taking Lisinopril 40 mg twice daily.  Depression: Patient is currently taking Zoloft 100 mg twice daily. On xanax 1 mg oral four times a day. Sees Dr Marquis Lunch every 6 months.   Urge incontinence: on gemtesa 75 mg daily. Doing very well.  Diet:: healthy Exercise: Patient is unable to exercise due to Neuropathy.   Current Outpatient Medications on File Prior to Visit  Medication Sig Dispense Refill   acetaminophen (TYLENOL) 325 MG tablet Take 325 mg by mouth every 6 (six) hours as needed.     ALPRAZolam (XANAX) 1 MG tablet Take 1 mg by mouth in the morning, at noon, in the evening, and at bedtime.      amitriptyline (ELAVIL) 25 MG tablet Take 1 tablet (25 mg total) by mouth at bedtime. 90 tablet 1   Clever Choice Lancets 28G MISC E11.4 Use new lancet each time when checking blood sugar 100 each 2   COMFORT EZ PEN NEEDLES 31G X 5 MM MISC E11.44 use new needle with each new injection 100 each 2   Continuous Blood Gluc Receiver (DEXCOM G6 RECEIVER) DEVI 1 each by  Does not apply route in the morning, at noon, and at bedtime. 1 each 3   Continuous Blood Gluc Sensor (DEXCOM G6 SENSOR) MISC Check sugars qac and qhs. 9 each 3   Continuous Blood Gluc Transmit (DEXCOM G6 TRANSMITTER) MISC 1 each by Does not apply route every 3 (three) months. 1 each 3   furosemide (LASIX) 20 MG tablet TAKE ONE TABLET BY MOUTH ONCE DAILY 90 tablet 0   GEMTESA 75 MG TABS TAKE ONE TABLET BY MOUTH ONCE DAILY 90 tablet 1   glucose blood (CLEVER CHOICE MICRO TEST) test strip E11.44 Use new strip each time when checking blood sugar. Check sugar 1-3 times daily. 100 each 2   insulin degludec (TRESIBA FLEXTOUCH) 100 UNIT/ML FlexTouch Pen Inject 65 Units into the skin daily. 60 mL 0   lisinopril (ZESTRIL) 40 MG tablet TAKE ONE TABLET BY MOUTH AT BREAKFAST AND AT BEDTIME 180 tablet 1   montelukast (SINGULAIR) 10 MG tablet Take 1 tablet by mouth daily 90 tablet 1   Multiple Vitamins-Minerals (CENTRUM SILVER PO) Take 1 tablet by mouth daily.     NOVOFINE PLUS PEN NEEDLE 32G X 4 MM MISC      pregabalin (LYRICA) 300 MG capsule Take 1 capsule (300 mg total) by mouth every evening. 90 capsule 1   rosuvastatin (CRESTOR) 10 MG tablet TAKE ONE TABLET BY MOUTH ONCE DAILY. 90 tablet 1   Semaglutide (RYBELSUS) 7 MG TABS Take 7 mg by mouth daily.  90 tablet 1   sertraline (ZOLOFT) 100 MG tablet Take 1 tablet (100 mg total) by mouth 2 (two) times daily. (Patient taking differently: Take 200 mg by mouth daily.) 180 tablet 1   Vitamin D, Cholecalciferol, 25 MCG (1000 UT) CAPS Take 2,000 Int'l Units by mouth.     No current facility-administered medications on file prior to visit.   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 ([facility])    Essential hypertension    Fibromyalgia    Generalized edema    Hyperlipidemia     Hypertension    Major depression    Migraine headache    Mixed hyperlipidemia    Sleep apnea    Telogen effluvium    Past Surgical History:  Procedure Laterality Date   CHOLECYSTECTOMY     lens implants Bilateral     Family History  Adopted: Yes  Problem Relation Age of Onset   Diabetes Maternal Aunt    Social History   Socioeconomic History   Marital status: Married    Spouse name: Not on file   Number of children: 1   Years of education: Not on file   Highest education level: Not on file  Occupational History   Occupation: disabled  Tobacco Use   Smoking status: Former    Types: Cigarettes    Quit date: 2000    Years since quitting: 23.2   Smokeless tobacco: Never  Vaping Use   Vaping Use: Never used  Substance and Sexual Activity   Alcohol use: Yes    Alcohol/week: 1.0 standard drink    Types: 1 Glasses of wine per week    Comment: socially   Drug use: Never   Sexual activity: Not on file  Other Topics Concern   Not on file  Social History Narrative   Not on file   Social Determinants of Health   Financial Resource Strain: Not on file  Food Insecurity: Not on file  Transportation Needs: Not on file  Physical Activity: Not on file  Stress: Not on file  Social Connections: Not on file    Review of Systems  Constitutional:  Positive for appetite change (loss of appetire.). Negative for fatigue and fever.  HENT:  Negative for congestion, ear pain, sinus pressure and sore throat.   Respiratory:  Negative for cough, chest tightness, shortness of breath and wheezing.   Cardiovascular:  Negative for chest pain and palpitations.  Gastrointestinal:  Negative for abdominal pain, constipation, diarrhea, nausea and vomiting.  Genitourinary:  Negative for dysuria and hematuria.  Musculoskeletal:  Negative for arthralgias, back pain, joint swelling and myalgias.  Skin:  Negative for rash.  Neurological:  Negative for dizziness, weakness and headaches.   Psychiatric/Behavioral:  Negative for dysphoric mood. The patient is not nervous/anxious.     Objective:  BP 136/82    Pulse 85    Temp (!) 96.9 F (36.1 C) (Temporal)    Ht 6' (1.829 m)    Wt (!) 318 lb (144.2 kg)    SpO2 97%    BMI 43.13 kg/m   BP/Weight 06/15/2021 03/09/2021 4/43/1540  Systolic BP 086 761 950  Diastolic BP 82 74 72  Wt. (Lbs) 318 318 -  BMI 43.13 43.13 -    Physical Exam Vitals reviewed.  Constitutional:      Appearance: Normal  appearance. She is normal weight.  Neck:     Vascular: No carotid bruit.  Cardiovascular:     Rate and Rhythm: Normal rate and regular rhythm.     Heart sounds: Normal heart sounds.  Pulmonary:     Effort: Pulmonary effort is normal. No respiratory distress.     Breath sounds: Normal breath sounds.  Abdominal:     General: Abdomen is flat. Bowel sounds are normal.     Palpations: Abdomen is soft.     Tenderness: There is no abdominal tenderness.  Neurological:     Mental Status: She is alert and oriented to person, place, and time.  Psychiatric:        Mood and Affect: Mood normal.        Behavior: Behavior normal.    Diabetic Foot Exam - Simple   Simple Foot Form Diabetic Foot exam was performed with the following findings: Yes 06/15/2021  2:59 PM  Visual Inspection See comments: Yes Sensation Testing Intact to touch and monofilament testing bilaterally: Yes Pulse Check Posterior Tibialis and Dorsalis pulse intact bilaterally: Yes Comments Calluses BL feet.       Lab Results  Component Value Date   WBC 7.4 03/09/2021   HGB 15.6 03/09/2021   HCT 46.8 (H) 03/09/2021   PLT 219 03/09/2021   GLUCOSE 79 03/09/2021   CHOL 176 03/09/2021   TRIG 156 (H) 03/09/2021   HDL 64 03/09/2021   LDLCALC 85 03/09/2021   ALT 20 03/09/2021   AST 33 03/09/2021   NA 142 03/09/2021   K 5.1 03/09/2021   CL 99 03/09/2021   CREATININE 0.89 03/09/2021   BUN 15 03/09/2021   CO2 27 03/09/2021   TSH 4.060 06/13/2019   HGBA1C 7.3 (H)  03/09/2021   MICROALBUR 10 12/02/2020      Assessment & Plan:   Problem List Items Addressed This Visit       Cardiovascular and Mediastinum   Hypertension associated with diabetes ([place])   Relevant Orders   Comprehensive metabolic panel     Endocrine   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  No orders of the defined types were placed in this encounter.   Orders Placed This Encounter  Procedures   MM DIGITAL SCREENING BILATERAL   DG Bone Density   CBC With Diff/Platelet   Microalbumin / creatinine urine ratio   Comprehensive metabolic panel   Lipid panel   Hemoglobin A1c     Follow-up: Return in about 3 months (around 09/15/2021) for chronic fasting.  An After Visit Summary was printed and given to the patient.    I,Lauren M Auman,acting as a scribe for [name], MD.,have documented all relevant documentation on the behalf of [name], MD,as directed by  [name], MD while in the presence of [name], MD.   [name], MD [place] [phone]

## 2021-06-15 NOTE — Patient Instructions (Signed)
Continue trulicity 3 mg once weekly.  ?

## 2021-06-17 ENCOUNTER — Other Ambulatory Visit: Payer: Self-pay

## 2021-06-17 ENCOUNTER — Telehealth: Payer: Self-pay

## 2021-06-17 LAB — CBC WITH DIFF/PLATELET
Basophils Absolute: 0.1 10*3/uL (ref 0.0–0.2)
Basos: 1 %
EOS (ABSOLUTE): 0.3 10*3/uL (ref 0.0–0.4)
Eos: 5 %
Hematocrit: 46.9 % — ABNORMAL HIGH (ref 34.0–46.6)
Hemoglobin: 15.7 g/dL (ref 11.1–15.9)
Immature Grans (Abs): 0.1 10*3/uL (ref 0.0–0.1)
Immature Granulocytes: 1 %
Lymphocytes Absolute: 1.3 10*3/uL (ref 0.7–3.1)
Lymphs: 20 %
MCH: 28.9 pg (ref 26.6–33.0)
MCHC: 33.5 g/dL (ref 31.5–35.7)
MCV: 86 fL (ref 79–97)
Monocytes Absolute: 0.4 10*3/uL (ref 0.1–0.9)
Monocytes: 6 %
Neutrophils Absolute: 4.4 10*3/uL (ref 1.4–7.0)
Neutrophils: 67 %
Platelets: 179 10*3/uL (ref 150–450)
RBC: 5.43 x10E6/uL — ABNORMAL HIGH (ref 3.77–5.28)
RDW: 14 % (ref 11.7–15.4)
WBC: 6.5 10*3/uL (ref 3.4–10.8)

## 2021-06-17 LAB — COMPREHENSIVE METABOLIC PANEL
ALT: 20 IU/L (ref 0–32)
AST: 31 IU/L (ref 0–40)
Albumin/Globulin Ratio: 1.9 (ref 1.2–2.2)
Albumin: 4.6 g/dL (ref 3.8–4.8)
Alkaline Phosphatase: 106 IU/L (ref 44–121)
BUN/Creatinine Ratio: 21 (ref 12–28)
BUN: 16 mg/dL (ref 8–27)
Bilirubin Total: 0.3 mg/dL (ref 0.0–1.2)
CO2: 27 mmol/L (ref 20–29)
Calcium: 9.7 mg/dL (ref 8.7–10.3)
Chloride: 100 mmol/L (ref 96–106)
Creatinine, Ser: 0.76 mg/dL (ref 0.57–1.00)
Globulin, Total: 2.4 g/dL (ref 1.5–4.5)
Glucose: 105 mg/dL — ABNORMAL HIGH (ref 70–99)
Potassium: 4.7 mmol/L (ref 3.5–5.2)
Sodium: 142 mmol/L (ref 134–144)
Total Protein: 7 g/dL (ref 6.0–8.5)
eGFR: 86 mL/min/{1.73_m2} (ref >59)

## 2021-06-17 LAB — LIPID PANEL
Chol/HDL Ratio: 2.3 ratio (ref 0.0–4.4)
Cholesterol, Total: 155 mg/dL (ref 100–199)
HDL: 68 mg/dL (ref >39)
LDL Chol Calc (NIH): 63 mg/dL (ref 0–99)
Triglycerides: 139 mg/dL (ref 0–149)
VLDL Cholesterol Cal: 24 mg/dL (ref 5–40)

## 2021-06-17 LAB — HEMOGLOBIN A1C
Est. average glucose Bld gHb Est-mCnc: 151 mg/dL
Hgb A1c MFr Bld: 6.9 % — ABNORMAL HIGH (ref 4.8–5.6)

## 2021-06-17 LAB — MICROALBUMIN / CREATININE URINE RATIO
Creatinine, Urine: 23.3 mg/dL
Microalb/Creat Ratio: <13 mg/g creat (ref 0–29)
Microalbumin, Urine: <3 ug/mL

## 2021-06-17 MED ORDER — GLUCOSE BLOOD VI STRP
ORAL_STRIP | 2 refills | Status: DC
Start: 2021-06-17 — End: 2021-06-18

## 2021-06-17 MED ORDER — PRODIGY LANCETS 28G MISC: 2 refills | Status: DC

## 2021-06-17 NOTE — Progress Notes (Signed)
? ? ?  Chronic Care Management ?Pharmacy Assistant  ? ?Name: Christy Crawford  MRN: [id number] DOB: 05-09-1955 ? ? ?Reason for Encounter: Refill Process  ? ?Pharmacy stated that her insurance will not cover the Prodigy Test Strips/Lancets and the cost out of pocket according to the pharmacy is  ? ?"okay for the lancets it will be the prodigy top twist lancets (I want to confirm that these are correct) but it looks like that is the only thing we can order/that is the only thing available for the prodigy meter lancets wise. For a box of 100 which would be a 34DS it would be $12.99 cash price, for 3 boxes of 100 which would be a 100DS it would be $18.97. For the test strips those only come in boxes of 50 count, 2 boxes of 50 for 34DS would be $19.98 or 6 boxes of 50 which is a 100DS would be $39.94. The strips are a little more expensive than the lancets". ? ?I called pt to confirm today and had to leave VM. Will try a little bit later to reach her.  ?  ?Called again today and received no answer. Pharmacy will leave these off until we hear from pt.  ? ?Medications: ?Outpatient Encounter Medications as of 06/17/2021  ?Medication Sig  ? acetaminophen (TYLENOL) 325 MG tablet Take 325 mg by mouth every 6 (six) hours as needed.  ? ALPRAZolam (XANAX) 1 MG tablet Take 1 mg by mouth in the morning, at noon, in the evening, and at bedtime.   ? amitriptyline (ELAVIL) 25 MG tablet Take 1 tablet (25 mg total) by mouth at bedtime.  ? COMFORT EZ PEN NEEDLES 31G X 5 MM MISC E11.44 use new needle with each new injection  ? Continuous Blood Gluc Receiver ([place]) DEVI 1 each by Does not apply route in the morning, at noon, and at bedtime.  ? Continuous Blood Gluc Sensor (DEXCOM G6 SENSOR) MISC Check sugars qac and qhs.  ? Continuous Blood Gluc Transmit (DEXCOM G6 TRANSMITTER) MISC 1 each by Does not apply route every 3 (three) months.  ? furosemide (LASIX) 20 MG tablet TAKE ONE TABLET BY MOUTH ONCE DAILY  ? GEMTESA 75 MG TABS TAKE  ONE TABLET BY MOUTH ONCE DAILY  ? glucose blood test strip E11.4 Use as instructed  ? insulin degludec (TRESIBA FLEXTOUCH) 100 UNIT/ML FlexTouch Pen Inject 65 Units into the skin daily.  ? lisinopril (ZESTRIL) 40 MG tablet TAKE ONE TABLET BY MOUTH AT BREAKFAST AND AT BEDTIME  ? montelukast (SINGULAIR) 10 MG tablet Take 1 tablet by mouth daily  ? Multiple Vitamins-Minerals (CENTRUM SILVER PO) Take 1 tablet by mouth daily.  ? NOVOFINE PLUS PEN NEEDLE 32G X 4 MM MISC   ? pregabalin (LYRICA) 300 MG capsule Take 1 capsule (300 mg total) by mouth every evening.  ? Prodigy Lancets 28G MISC E11.4 Use new lancet each time when checking FBS.  ? rosuvastatin (CRESTOR) 10 MG tablet TAKE ONE TABLET BY MOUTH ONCE DAILY.  ? Semaglutide (RYBELSUS) 7 MG TABS Take 7 mg by mouth daily.  ? sertraline (ZOLOFT) 100 MG tablet Take 1 tablet (100 mg total) by mouth 2 (two) times daily. (Patient taking differently: Take 200 mg by mouth daily.)  ? Vitamin D, Cholecalciferol, 25 MCG (1000 UT) CAPS Take 2,000 Int'l Units by mouth.  ? ?No facility-administered encounter medications on file as of 06/17/2021.  ? ? ?Elray Mcgregor, CMA ?Clinical Pharmacist Assistant  ?[phone]  ?

## 2021-06-18 ENCOUNTER — Other Ambulatory Visit: Payer: Self-pay

## 2021-06-18 MED ORDER — GLUCOSE BLOOD VI STRP: ORAL_STRIP | 12 refills | Status: DC

## 2021-06-18 MED ORDER — ONETOUCH ULTRASOFT LANCETS MISC: 12 refills | Status: DC

## 2021-06-18 MED ORDER — ONETOUCH PING METER REMOTE SUPPLIES MISC: 1.0000 | Freq: Three times a day (TID) | 0 refills | Status: DC

## 2021-06-18 NOTE — Progress Notes (Signed)
Mrs. Khiev called me back today and confirmed she wants to switch supplies to what insurance will cover. Chasity stated either Onetouch or Accuchek. I am confirming with pharmacy and will send Dr. Tobie Poet a message to send these in to [place] so we can start filling for pt. ? ?Elray Mcgregor, CMA ?Clinical Pharmacist Assistant  ?[phone]  ?

## 2021-06-18 NOTE — Telephone Encounter (Signed)
-----   Message from [place], [place] sent at 06/18/2021 11:06 AM EDT ----- ?Regarding: FW: Diabetic Supplies ? ?----- Message ----- ?From: Eulis Canner ?Sent: 06/18/2021  10:45 AM EDT ?To: [facility] ?Subject: Diabetic Supplies                             ? ?Good morning! ? ?Insurance will not cover Prodigy testing supplies. I spoke with pt and informed her and she wants what insurance will cover which is: ? ?Onetouch Meter/Lancet/Test Strips. ? ?Can you send these in to [facility] and we can fill these today for her?? ? ?Thank you ? ?Elray Mcgregor, CMA ?Clinical Pharmacist Assistant  ?[phone]  ? ? ?

## 2021-06-19 NOTE — Assessment & Plan Note (Signed)
Check mammogram 

## 2021-06-19 NOTE — Assessment & Plan Note (Signed)
Check Dexa. ?

## 2021-06-19 NOTE — Assessment & Plan Note (Signed)
Recommend continue to work on eating healthy diet and exercise.  

## 2021-06-19 NOTE — Assessment & Plan Note (Addendum)
Well controlled.  ?No changes to medicines. Continue taking Lisinopril 40 mg twice daily. ?Continue to work on eating a healthy diet and exercise.  ?Labs drawn today.  ? ?

## 2021-06-19 NOTE — Assessment & Plan Note (Signed)
Continue amitriptyline 25 mg before bed.  

## 2021-06-19 NOTE — Assessment & Plan Note (Signed)
Well controlled.  ?No changes to medicines. Continue Rosuvastatin 10 mg take 1 tablet daily. ?Continue to work on eating a healthy diet and exercise.  ?Labs drawn today.  ? ?

## 2021-06-19 NOTE — Assessment & Plan Note (Signed)
Control: improved. ?Recommend check sugars fasting daily. ?Recommend check feet daily. ?Recommend annual eye exams. ?Medicines: continue rybelsus. Continue tresiba and lyrica.  ?Continue to work on eating a healthy diet and exercise.  ?Labs drawn today.   ? ?

## 2021-06-19 NOTE — Assessment & Plan Note (Signed)
Continue Zoloft 100 mg twice daily. On xanax 1 mg oral four times a day. Sees Dr Marquis Lunch every 6 months.  ? ?

## 2021-08-21 ENCOUNTER — Telehealth: Payer: Self-pay

## 2021-08-21 NOTE — Chronic Care Management (AMB) (Signed)
Novo Nordisk patient assistance program notification:  Patient currently enrolled in auto-refill. 120- day supply of Rybelsus 7 mg will be filled on 08/23/2021 and should arrive to the office in 10-14 business days. Patient enrollment will expire on 03/04/2022.  Pattricia Boss, [facility] Pharmacist Assistant [phone]

## 2021-08-24 ENCOUNTER — Other Ambulatory Visit: Payer: Self-pay

## 2021-08-24 ENCOUNTER — Telehealth: Payer: Self-pay

## 2021-08-24 NOTE — Telephone Encounter (Signed)
Patient called stating she has a mammogram this Wed and her husband will pick up her patient assistance paperwork then.

## 2021-08-25 ENCOUNTER — Telehealth: Payer: Self-pay

## 2021-08-25 NOTE — Chronic Care Management (AMB) (Unsigned)
    Chronic Care Management Pharmacy Assistant   Name: Christy Crawford  MRN: [id number] DOB: [birth year]  Reason for Encounter: Disease State/ Diabetes  Recent office visits:  06-15-2021 Christy Brome, MD. RBC= 5.43, Hema= 46.9. Glucose= 105. A1C= 6.5. Mammogram and bone density ordered. STOP excedrin.  Recent consult visits:  None  Hospital visits:  None in previous 6 months  Medications: Outpatient Encounter Medications as of 08/25/2021  Medication Sig   acetaminophen (TYLENOL) 325 MG tablet Take 325 mg by mouth every 6 (six) hours as needed.   ALPRAZolam (XANAX) 1 MG tablet Take 1 mg by mouth in the morning, at noon, in the evening, and at bedtime.    amitriptyline (ELAVIL) 25 MG tablet Take 1 tablet (25 mg total) by mouth at bedtime.   Blood Glucose Monitoring Suppl (ONETOUCH PING METER REMOTE) Supplies MISC 1 each by Does not apply route 4 (four) times daily -  before meals and at bedtime.   COMFORT EZ PEN NEEDLES 31G X 5 MM MISC E11.44 use new needle with each new injection   Continuous Blood Gluc Receiver ([place]) DEVI 1 each by Does not apply route in the morning, at noon, and at bedtime.   Continuous Blood Gluc Sensor (DEXCOM G6 SENSOR) MISC Check sugars qac and qhs.   Continuous Blood Gluc Transmit (DEXCOM G6 TRANSMITTER) MISC 1 each by Does not apply route every 3 (three) months.   furosemide (LASIX) 20 MG tablet TAKE ONE TABLET BY MOUTH ONCE DAILY   GEMTESA 75 MG TABS TAKE ONE TABLET BY MOUTH ONCE DAILY   glucose blood test strip Use as instructed   insulin degludec (TRESIBA FLEXTOUCH) 100 UNIT/ML FlexTouch Pen Inject 65 Units into the skin daily.   Lancets (ONETOUCH ULTRASOFT) lancets Use as instructed   lisinopril (ZESTRIL) 40 MG tablet TAKE ONE TABLET BY MOUTH AT BREAKFAST AND AT BEDTIME   montelukast (SINGULAIR) 10 MG tablet Take 1 tablet by mouth daily   Multiple Vitamins-Minerals (CENTRUM SILVER PO) Take 1 tablet by mouth daily.   NOVOFINE PLUS PEN NEEDLE  32G X 4 MM MISC    pregabalin (LYRICA) 300 MG capsule Take 1 capsule (300 mg total) by mouth every evening.   rosuvastatin (CRESTOR) 10 MG tablet TAKE ONE TABLET BY MOUTH ONCE DAILY.   Semaglutide (RYBELSUS) 7 MG TABS Take 7 mg by mouth daily.   sertraline (ZOLOFT) 100 MG tablet Take 1 tablet (100 mg total) by mouth 2 (two) times daily. (Patient taking differently: Take 200 mg by mouth daily.)   Vitamin D, Cholecalciferol, 25 MCG (1000 UT) CAPS Take 2,000 Int'l Units by mouth.   No facility-administered encounter medications on file as of 08/25/2021.   Recent Relevant Labs: Lab Results  Component Value Date/Time   HGBA1C 6.9 (H) 06/15/2021 03:18 PM   HGBA1C 7.3 (H) 03/09/2021 03:11 PM   HGBA1C 7.5 02/12/2019 12:00 AM   MICROALBUR 10 12/02/2020 03:31 PM    Kidney Function Lab Results  Component Value Date/Time   CREATININE 0.76 06/15/2021 03:18 PM   CREATININE 0.89 03/09/2021 03:11 PM   GFRNONAA 60 05/12/2020 02:40 PM   GFRAA 69 05/12/2020 02:40 PM    08-25-2021: 1st attempt left VM 09-01-2021: 2nd attempt. VM full 09-02-2021: 3rd attempt VM full  Star Rating Drugs: Rosuvastatin 10 mg- Last filled 06-12-2021 90 DS Rybelsus 7 mg- Last filled 06-16-2021 90 DS Lisinopril 40 mg- Last filled 06-12-2021 90 DS   [place] Clinical Pharmacist Assistant [phone]

## 2021-08-26 ENCOUNTER — Other Ambulatory Visit: Payer: Self-pay | Admitting: Family Medicine

## 2021-08-26 ENCOUNTER — Ambulatory Visit
Admission: RE | Admit: 2021-08-26 | Discharge: 2021-08-26 | Disposition: A | Discharge status: Home / Self Care | Payer: Medicare Other | Source: Ambulatory Visit | Attending: Family Medicine | Admitting: Family Medicine

## 2021-08-26 DIAGNOSIS — Z1231 Encounter for screening mammogram for malignant neoplasm of breast: Secondary | ICD-10-CM

## 2021-08-31 ENCOUNTER — Other Ambulatory Visit: Payer: Self-pay | Admitting: Family Medicine

## 2021-08-31 DIAGNOSIS — N3941 Urge incontinence: Secondary | ICD-10-CM

## 2021-09-03 ENCOUNTER — Telehealth: Payer: Self-pay

## 2021-09-03 NOTE — Chronic Care Management (AMB) (Addendum)
Novo Nordisk patient assistance program notification:  120- day supply of Tresiba 100 u/ml will be filled on 09/13/2021 and should arrive to the office in 10-14 business days. Patient enrollment will expire on 03/04/2022.  120- day supply of Rybelsus 7 mg  should arrive to the office in 10-14 business days. Patient has 4  refill remaining and enrollment will expire on 03/04/2022.The next refill for patient will be fulfilled on 10/07/2021   Pattricia Boss, [place] Pharmacist Assistant [phone]

## 2021-09-07 ENCOUNTER — Telehealth: Payer: Self-pay

## 2021-09-07 NOTE — Progress Notes (Signed)
Chronic Care Management Pharmacy Assistant   Name: JALAYA SARVER  MRN: [id number] DOB: [birth year]   Reason for Encounter: Medication Coordination for Upstream    Recent office visits:  None  Recent consult visits:  None  Hospital visits:  None  Medications: Outpatient Encounter Medications as of 09/07/2021  Medication Sig   acetaminophen (TYLENOL) 325 MG tablet Take 325 mg by mouth every 6 (six) hours as needed.   ALPRAZolam (XANAX) 1 MG tablet Take 1 mg by mouth in the morning, at noon, in the evening, and at bedtime.    amitriptyline (ELAVIL) 25 MG tablet Take 1 tablet (25 mg total) by mouth at bedtime.   Blood Glucose Monitoring Suppl (ONETOUCH PING METER REMOTE) Supplies MISC 1 each by Does not apply route 4 (four) times daily -  before meals and at bedtime.   COMFORT EZ PEN NEEDLES 31G X 5 MM MISC E11.44 use new needle with each new injection   Continuous Blood Gluc Receiver ([place]) DEVI 1 each by Does not apply route in the morning, at noon, and at bedtime.   Continuous Blood Gluc Sensor (DEXCOM G6 SENSOR) MISC Check sugars qac and qhs.   Continuous Blood Gluc Transmit (DEXCOM G6 TRANSMITTER) MISC 1 each by Does not apply route every 3 (three) months.   furosemide (LASIX) 20 MG tablet TAKE ONE TABLET BY MOUTH ONCE DAILY   GEMTESA 75 MG TABS TAKE ONE TABLET BY MOUTH ONCE DAILY   glucose blood test strip Use as instructed   insulin degludec (TRESIBA FLEXTOUCH) 100 UNIT/ML FlexTouch Pen Inject 65 Units into the skin daily.   Lancets (ONETOUCH ULTRASOFT) lancets Use as instructed   lisinopril (ZESTRIL) 40 MG tablet TAKE ONE TABLET BY MOUTH EVERY MORNING and TAKE ONE TABLET BY MOUTH EVERYDAY AT BEDTIME   montelukast (SINGULAIR) 10 MG tablet Take 1 tablet by mouth daily   Multiple Vitamins-Minerals (CENTRUM SILVER PO) Take 1 tablet by mouth daily.   NOVOFINE PLUS PEN NEEDLE 32G X 4 MM MISC    pregabalin (LYRICA) 300 MG capsule Take 1 capsule (300 mg total) by mouth  every evening.   rosuvastatin (CRESTOR) 10 MG tablet TAKE ONE TABLET BY MOUTH ONCE DAILY.   Semaglutide (RYBELSUS) 7 MG TABS Take 7 mg by mouth daily.   sertraline (ZOLOFT) 100 MG tablet Take 1 tablet (100 mg total) by mouth 2 (two) times daily. (Patient taking differently: Take 200 mg by mouth daily.)   Vitamin D, Cholecalciferol, 25 MCG (1000 UT) CAPS Take 2,000 Int'l Units by mouth.   No facility-administered encounter medications on file as of 09/07/2021.    Reviewed chart for medication changes ahead of medication coordination call.  No OVs, Consults, or hospital visits since last care coordination call/Pharmacist visit.   No medication changes indicated OR if recent visit, treatment plan here.  BP Readings from Last 3 Encounters:  06/15/21 136/82  03/09/21 114/74  12/16/20 130/72    Lab Results  Component Value Date   HGBA1C 6.9 (H) 06/15/2021     Patient obtains medications through Vials  90 Days   Last adherence delivery included:  Rybelsus '7mg'$  1 tab daily Gemtesa 75 mg 1 tab once daily Lisinopril 40 mg 1 tab at breakfast and bedtime Montelukast 10 mg 1 tab at bedtime Rosuvastatin 10 mg 1 tab once daily Sertraline 100 mg 2 tabs once daily Pregabalin 300 mg 1 capsule every evening Amitriptyline 25 mg 1 tab at bedtime Xanax 1 mg 1 tab four  times daily prn  Prodigy No Coding Test Strips Prodigy No Coding Lancets   Patient declined (meds) last delivery Metformin '500mg'$  -Side effects, she will see Dr. Tobie Poet next week to discuss. Sent message informing staff  Tyler Aas Flextouch Gets through PAP  Patient is due for next adherence delivery on: 09/17/21. Called patient and reviewed medications and coordinated delivery.  This delivery to include: Gemtesa 75 mg 1 tab once daily Lisinopril 40 mg 1 tab at breakfast and bedtime Montelukast 10 mg 1 tab at bedtime Rosuvastatin 10 mg 1 tab once daily Sertraline 100 mg 2 tabs once daily Pregabalin 300 mg 1 capsule every  evening Amitriptyline 25 mg 1 tab at bedtime Xanax 1 mg 1 tab four times daily prn  Prodigy No Coding Test Strips Prodigy No Coding Lancets  Furosemide '20mg'$ - Take 1 tablet daily   Patient declined the following medications  Tresiba Flextouch Gets through PAP Rybelsus '7mg'$  -Pt stated she was only taking this due to having to wait for her Trulicity through PAP. Pt is notw receiving Trulicity and has stopped this completely.  Trulicity '3mg'$ - PAP  Patient needs refills- Request Sent  Furosemide '20mg'$   Confirmed delivery date of 09/17/21, advised patient that pharmacy will contact them the morning of delivery.  Elray Mcgregor, [place] Pharmacist Assistant  [phone]

## 2021-09-08 ENCOUNTER — Other Ambulatory Visit: Payer: Self-pay

## 2021-09-08 MED ORDER — FUROSEMIDE 20 MG PO TABS: 20.0000 mg | ORAL_TABLET | Freq: Every day | ORAL | 0 refills | Status: DC

## 2021-09-08 NOTE — Telephone Encounter (Signed)
Compliant on meds. Trulicity PAP approved so patient stopped Rybelsus

## 2021-09-22 ENCOUNTER — Ambulatory Visit: Payer: Medicare Other | Admitting: Family Medicine

## 2021-09-24 ENCOUNTER — Encounter: Payer: Self-pay | Admitting: Family Medicine

## 2021-09-24 ENCOUNTER — Ambulatory Visit (INDEPENDENT_AMBULATORY_CARE_PROVIDER_SITE_OTHER): Payer: Medicare Other | Admitting: Family Medicine

## 2021-09-24 VITALS — BP 124/68 | HR 94 | Temp 97.1°F | Ht 72.0 in | Wt 306.0 lb

## 2021-09-24 DIAGNOSIS — I152 Hypertension secondary to endocrine disorders: Secondary | ICD-10-CM

## 2021-09-24 DIAGNOSIS — E782 Mixed hyperlipidemia: Secondary | ICD-10-CM | POA: Diagnosis not present

## 2021-09-24 DIAGNOSIS — Z6841 Body Mass Index (BMI) 40.0 and over, adult: Secondary | ICD-10-CM

## 2021-09-24 DIAGNOSIS — F330 Major depressive disorder, recurrent, mild: Secondary | ICD-10-CM

## 2021-09-24 DIAGNOSIS — E1159 Type 2 diabetes mellitus with other circulatory complications: Secondary | ICD-10-CM | POA: Diagnosis not present

## 2021-09-24 DIAGNOSIS — E1144 Type 2 diabetes mellitus with diabetic amyotrophy: Secondary | ICD-10-CM | POA: Diagnosis not present

## 2021-09-24 NOTE — Patient Instructions (Signed)
Family member went to the pharmacy On

## 2021-09-24 NOTE — Assessment & Plan Note (Signed)
Well controlled.  No changes to medicines. currently taking Rosuvastatin 10 mg take 1 tablet daily Continue to work on eating a healthy diet and exercise.  Labs drawn today.

## 2021-09-24 NOTE — Assessment & Plan Note (Signed)
Well controlled.  No changes to medicines. Currently on Lisinopril 40 mg twice a day. Continue to work on eating a healthy diet and exercise.  Labs drawn today.

## 2021-09-24 NOTE — Assessment & Plan Note (Signed)
Control: Fair Recommend check sugars fasting daily. Recommend check feet daily. Recommend annual eye exams. Medicines: Trulicity 1.'5mg'$  once weekly. Also taking Tresiba 65 units qhs Continue to work on eating a healthy diet and exercise.  Labs drawn today.

## 2021-09-24 NOTE — Assessment & Plan Note (Signed)
Recommend continue to work on eating healthy diet and exercise.  

## 2021-09-24 NOTE — Progress Notes (Incomplete)
Subjective:  Patient ID: Christy Crawford, female    DOB: 04-01-1956  Age: 66 y.o. MRN: [id number]  Chief Complaint  Patient presents with  . Diabetes  . Hyperlipidemia  . Hypertension    ******Needs re-wording****  HPI:  Diabetes:  Complications: Diabetic macular edema. Amyotropy/neuropathy. Glucose checking: Patient checks sugars three times daily. Glucose logs: lowest 70s and highest 150s Hypoglycemia: a few readings in the 70's, but patient does not have symptoms or feel like its low.  Most recent A1C: 7.3 Current medications: Patient is  still currently taking Trulicity 1.8HU once weekly. Also taking Tresiba 65 units qhs, Lyrica 300 mg one tablet twice daily. Last Eye Exam. 01/27/2021.Marland Kitchen Seeing every 6 months. Foot checks: Patient checks her feet daily.  Hyperlipidemia: Current medications: Patient is currently taking Rosuvastatin 10 mg take 1 tablet daily.  Hypertension: Current medications: Patient is currently taking Lisinopril 40 mg twice daily.  Depression: Patient is currently taking Zoloft 100 mg twice daily. On xanax 1 mg oral four times a day. Sees Dr Marquis Lunch every 6 months.   Urge incontinence: on gemtesa 75 mg daily. Doing very well.  Diet: healthy Exercise: Patient is unable to exercise due to Amyotrophy/Neuropathy.   Current Outpatient Medications on File Prior to Visit  Medication Sig Dispense Refill  . acetaminophen (TYLENOL) 325 MG tablet Take 325 mg by mouth every 6 (six) hours as needed.    . ALPRAZolam (XANAX) 1 MG tablet Take 1 mg by mouth in the morning, at noon, in the evening, and at bedtime.     Marland Kitchen amitriptyline (ELAVIL) 25 MG tablet Take 1 tablet (25 mg total) by mouth at bedtime. 90 tablet 1  . Blood Glucose Monitoring Suppl (ONETOUCH PING METER REMOTE) Supplies MISC 1 each by Does not apply route 4 (four) times daily -  before meals and at bedtime. 1 each 0  . Blood Glucose Monitoring Suppl ([place]) w/Device KIT USE TO  check blood glucose AS DIRECTED    . COMFORT EZ PEN NEEDLES 31G X 5 MM MISC E11.44 use new needle with each new injection 100 each 2  . Dulaglutide (TRULICITY) 1.5 DJ/4.9FW SOPN Inject 1.5 mg into the skin once a week.    . furosemide (LASIX) 20 MG tablet Take 1 tablet (20 mg total) by mouth daily. 90 tablet 0  . GEMTESA 75 MG TABS TAKE ONE TABLET BY MOUTH ONCE DAILY 90 tablet 1  . glucose blood test strip Use as instructed 100 each 12  . insulin degludec (TRESIBA FLEXTOUCH) 100 UNIT/ML FlexTouch Pen Inject 65 Units into the skin daily. 60 mL 0  . Lancets (ONETOUCH ULTRASOFT) lancets Use as instructed 100 each 12  . lisinopril (ZESTRIL) 40 MG tablet TAKE ONE TABLET BY MOUTH EVERY MORNING and TAKE ONE TABLET BY MOUTH EVERYDAY AT BEDTIME 180 tablet 1  . montelukast (SINGULAIR) 10 MG tablet Take 1 tablet by mouth daily 90 tablet 1  . Multiple Vitamins-Minerals (CENTRUM SILVER PO) Take 1 tablet by mouth daily.    Marland Kitchen NOVOFINE PLUS PEN NEEDLE 32G X 4 MM MISC     . pregabalin (LYRICA) 300 MG capsule Take 1 capsule (300 mg total) by mouth every evening. 90 capsule 1  . rosuvastatin (CRESTOR) 10 MG tablet TAKE ONE TABLET BY MOUTH ONCE DAILY. 90 tablet 1  . sertraline (ZOLOFT) 100 MG tablet Take 1 tablet (100 mg total) by mouth 2 (two) times daily. (Patient taking differently: Take 200 mg by mouth daily.) 180  tablet 1  . Vitamin D, Cholecalciferol, 25 MCG (1000 UT) CAPS Take 2,000 Int'l Units by mouth.     No current facility-administered medications on file prior to visit.   Past Medical History:  Diagnosis Date  . Chronic ulcer of right calf ([place])   . Diabetes mellitus without complication ([place])   . 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 Essential hypertension   . Fibromyalgia   . Generalized edema   . Hyperlipidemia   . Hypertension   . Major depression   . Migraine headache   . Mixed hyperlipidemia   . Sleep apnea   .  Telogen effluvium    Past Surgical History:  Procedure Laterality Date  . CHOLECYSTECTOMY    . lens implants Bilateral     Family History  Adopted: Yes  Problem Relation Age of Onset  . Diabetes Maternal Aunt    Social History   Socioeconomic History  . Marital status: Married    Spouse name: Not on file  . Number of children: 1  . Years of education: Not on file  . Highest education level: Not on file  Occupational History  . Occupation: disabled  Tobacco Use  . Smoking status: Former    Types: Cigarettes    Quit date: 2000    Years since quitting: 23.4  . Smokeless tobacco: Never  Vaping Use  . Vaping Use: Never used  Substance and Sexual Activity  . Alcohol use: Yes    Alcohol/week: 1.0 standard drink of alcohol    Types: 1 Glasses of wine per week    Comment: socially  . Drug use: Never  . Sexual activity: Not on file  Other Topics Concern  . Not on file  Social History Narrative  . Not on file   Social Determinants of Health   Financial Resource Strain: Not on file  Food Insecurity: No Food Insecurity (11/14/2019)   Hunger Vital Sign   . Worried About Charity fundraiser in the Last Year: Never true   . Ran Out of Food in the Last Year: Never true  Transportation Needs: No Transportation Needs (11/14/2019)   PRAPARE - Transportation   . Lack of Transportation (Medical): No   . Lack of Transportation (Non-Medical): No  Physical Activity: Not on file  Stress: Not on file  Social Connections: Not on file    Review of Systems  Constitutional:  Negative for appetite change, fatigue and fever.  HENT:  Negative for congestion, ear pain, sinus pressure and sore throat.   Respiratory:  Negative for cough, chest tightness, shortness of breath and wheezing.   Cardiovascular:  Negative for chest pain and palpitations.  Gastrointestinal:  Negative for abdominal pain, constipation, diarrhea, nausea and vomiting.  Genitourinary:  Negative for dysuria and hematuria.   Musculoskeletal:  Negative for arthralgias, back pain, joint swelling and myalgias.  Skin:  Negative for rash.  Neurological:  Negative for dizziness, weakness and headaches.  Psychiatric/Behavioral:  Negative for dysphoric mood. The patient is not nervous/anxious.      Objective:  BP (!) 152/82 (BP Location: Right Arm, Patient Position: Sitting)   Pulse 94   Temp (!) 97.1 F (36.2 C) (Temporal)   Ht 6' (1.829 m)   Wt (!) 306 lb (138.8 kg)   SpO2 96%   BMI 41.50 kg/m      09/24/2021    3:14 PM 06/15/2021    1:58 PM 03/09/2021  2:03 PM  BP/Weight  Systolic BP 384 536 468  Diastolic BP 82 82 74  Wt. (Lbs) 306 318 318  BMI 41.5 kg/m2 43.13 kg/m2 43.13 kg/m2    Physical Exam Vitals reviewed.  Constitutional:      Appearance: Normal appearance. She is normal weight.  Cardiovascular:     Rate and Rhythm: Normal rate and regular rhythm.     Heart sounds: Normal heart sounds.  Pulmonary:     Effort: Pulmonary effort is normal.     Breath sounds: Normal breath sounds.  Abdominal:     General: Abdomen is flat. Bowel sounds are normal.     Palpations: Abdomen is soft.  Neurological:     Mental Status: She is alert and oriented to person, place, and time.  Psychiatric:        Mood and Affect: Mood normal.        Behavior: Behavior normal.    Diabetic Foot Exam - Simple   No data filed      Lab Results  Component Value Date   WBC 6.5 06/15/2021   HGB 15.7 06/15/2021   HCT 46.9 (H) 06/15/2021   PLT 179 06/15/2021   GLUCOSE 105 (H) 06/15/2021   CHOL 155 06/15/2021   TRIG 139 06/15/2021   HDL 68 06/15/2021   LDLCALC 63 06/15/2021   ALT 20 06/15/2021   AST 31 06/15/2021   NA 142 06/15/2021   K 4.7 06/15/2021   CL 100 06/15/2021   CREATININE 0.76 06/15/2021   BUN 16 06/15/2021   CO2 27 06/15/2021   TSH 4.060 06/13/2019   HGBA1C 6.9 (H) 06/15/2021   MICROALBUR 10 12/02/2020      Assessment & Plan:   Problem List Items Addressed This Visit    None .  No orders of the defined types were placed in this encounter.   No orders of the defined types were placed in this encounter.    Follow-up: No follow-ups on file.  An After Visit Summary was printed and given to the patient.    I,Lauren M Auman,acting as a scribe for [name], MD.,have documented all relevant documentation on the behalf of [name], MD,as directed by  [name], MD while in the presence of [name], MD.    [name], MD [place] [phone]

## 2021-09-25 LAB — COMPREHENSIVE METABOLIC PANEL
ALT: 27 IU/L (ref 0–32)
AST: 49 IU/L — ABNORMAL HIGH (ref 0–40)
Albumin/Globulin Ratio: 2.2 (ref 1.2–2.2)
Albumin: 4.7 g/dL (ref 3.8–4.8)
Alkaline Phosphatase: 113 IU/L (ref 44–121)
BUN/Creatinine Ratio: 13 (ref 12–28)
BUN: 11 mg/dL (ref 8–27)
Bilirubin Total: 0.4 mg/dL (ref 0.0–1.2)
CO2: 28 mmol/L (ref 20–29)
Calcium: 9.8 mg/dL (ref 8.7–10.3)
Chloride: 100 mmol/L (ref 96–106)
Creatinine, Ser: 0.85 mg/dL (ref 0.57–1.00)
Globulin, Total: 2.1 g/dL (ref 1.5–4.5)
Glucose: 56 mg/dL — ABNORMAL LOW (ref 70–99)
Potassium: 5 mmol/L (ref 3.5–5.2)
Sodium: 141 mmol/L (ref 134–144)
Total Protein: 6.8 g/dL (ref 6.0–8.5)
eGFR: 75 mL/min/{1.73_m2} (ref >59)

## 2021-09-25 LAB — CBC WITH DIFFERENTIAL/PLATELET
Basophils Absolute: 0.1 10*3/uL (ref 0.0–0.2)
Basos: 1 %
EOS (ABSOLUTE): 0.1 10*3/uL (ref 0.0–0.4)
Eos: 2 %
Hematocrit: 46.9 % — ABNORMAL HIGH (ref 34.0–46.6)
Hemoglobin: 15.6 g/dL (ref 11.1–15.9)
Immature Grans (Abs): 0 10*3/uL (ref 0.0–0.1)
Immature Granulocytes: 1 %
Lymphocytes Absolute: 0.9 10*3/uL (ref 0.7–3.1)
Lymphs: 15 %
MCH: 28.4 pg (ref 26.6–33.0)
MCHC: 33.3 g/dL (ref 31.5–35.7)
MCV: 85 fL (ref 79–97)
Monocytes Absolute: 0.4 10*3/uL (ref 0.1–0.9)
Monocytes: 7 %
Neutrophils Absolute: 4.6 10*3/uL (ref 1.4–7.0)
Neutrophils: 74 %
Platelets: 196 10*3/uL (ref 150–450)
RBC: 5.5 x10E6/uL — ABNORMAL HIGH (ref 3.77–5.28)
RDW: 13.7 % (ref 11.7–15.4)
WBC: 6.2 10*3/uL (ref 3.4–10.8)

## 2021-09-25 LAB — HEMOGLOBIN A1C
Est. average glucose Bld gHb Est-mCnc: 148 mg/dL
Hgb A1c MFr Bld: 6.8 % — ABNORMAL HIGH (ref 4.8–5.6)

## 2021-09-25 LAB — LIPID PANEL
Chol/HDL Ratio: 3.1 ratio (ref 0.0–4.4)
Cholesterol, Total: 155 mg/dL (ref 100–199)
HDL: 50 mg/dL (ref >39)
LDL Chol Calc (NIH): 78 mg/dL (ref 0–99)
Triglycerides: 158 mg/dL — ABNORMAL HIGH (ref 0–149)
VLDL Cholesterol Cal: 27 mg/dL (ref 5–40)

## 2021-09-29 ENCOUNTER — Telehealth: Payer: Self-pay

## 2021-09-29 NOTE — Chronic Care Management (AMB) (Signed)
Novo Nordisk patient assistance program notification:  120- day supply of Rybelsus 7 mg will be filled on 10/16/2021 and should arrive to the office in 10-14 business days. Patient enrollment will expire on 03/04/2022.  Pattricia Boss, [place] Pharmacist Assistant [phone]

## 2021-09-29 NOTE — Chronic Care Management (AMB) (Signed)
Novo Nordisk patient assistance program notification:  120- day supply of Tresiba 100 u/ml was filled on 09/15/2021 and should arrive to the office in 10-14 business days. Patient has 1  refill remaining and enrollment will expire on 03/04/2022.  The next refill for patient will be fulfilled on 12/02/2021.  Pattricia Boss, [place] Pharmacist Assistant [phone]

## 2021-11-13 ENCOUNTER — Telehealth: Payer: Self-pay

## 2021-11-18 ENCOUNTER — Telehealth: Payer: Self-pay

## 2021-11-18 NOTE — Chronic Care Management (AMB) (Signed)
Chronic Care Management Pharmacy Assistant   Name: Christy Crawford  MRN: [id number] DOB: 04/12/1955  Reason for Encounter: Disease State/ Diabetes  Recent office visits:  09-24-2021 Blane Ohara, MD. RBC= 5.50, Hema= 46.9. Glucose= 56, AST= 49. A1C= 6.8. Trig= 158. STOP rybelsus  Recent consult visits:  None  Hospital visits:  None in previous 6 months  Medications: Outpatient Encounter Medications as of 11/18/2021  Medication Sig   acetaminophen (TYLENOL) 325 MG tablet Take 325 mg by mouth every 6 (six) hours as needed.   ALPRAZolam (XANAX) 1 MG tablet Take 1 mg by mouth in the morning, at noon, in the evening, and at bedtime.    amitriptyline (ELAVIL) 25 MG tablet Take 1 tablet (25 mg total) by mouth at bedtime.   Blood Glucose Monitoring Suppl (ONETOUCH PING METER REMOTE) Supplies MISC 1 each by Does not apply route 4 (four) times daily -  before meals and at bedtime.   Blood Glucose Monitoring Suppl (ONETOUCH VERIO FLEX SYSTEM) w/Device KIT USE TO check blood glucose AS DIRECTED   COMFORT EZ PEN NEEDLES 31G X 5 MM MISC E11.44 use new needle with each new injection   Dulaglutide (TRULICITY) 1.5 MG/0.5ML SOPN Inject 1.5 mg into the skin once a week.   furosemide (LASIX) 20 MG tablet Take 1 tablet (20 mg total) by mouth daily.   GEMTESA 75 MG TABS TAKE ONE TABLET BY MOUTH ONCE DAILY   glucose blood test strip Use as instructed   insulin degludec (TRESIBA FLEXTOUCH) 100 UNIT/ML FlexTouch Pen Inject 65 Units into the skin daily.   Lancets (ONETOUCH ULTRASOFT) lancets Use as instructed   lisinopril (ZESTRIL) 40 MG tablet TAKE ONE TABLET BY MOUTH EVERY MORNING and TAKE ONE TABLET BY MOUTH EVERYDAY AT BEDTIME   montelukast (SINGULAIR) 10 MG tablet Take 1 tablet by mouth daily   Multiple Vitamins-Minerals (CENTRUM SILVER PO) Take 1 tablet by mouth daily.   NOVOFINE PLUS PEN NEEDLE 32G X 4 MM MISC    pregabalin (LYRICA) 300 MG capsule Take 1 capsule (300 mg total) by mouth every  evening.   rosuvastatin (CRESTOR) 10 MG tablet TAKE ONE TABLET BY MOUTH ONCE DAILY.   sertraline (ZOLOFT) 100 MG tablet Take 1 tablet (100 mg total) by mouth 2 (two) times daily. (Patient taking differently: Take 200 mg by mouth daily.)   Vitamin D, Cholecalciferol, 25 MCG (1000 UT) CAPS Take 2,000 Int'l Units by mouth.   No facility-administered encounter medications on file as of 11/18/2021.  Recent Relevant Labs: Lab Results  Component Value Date/Time   HGBA1C 6.8 (H) 09/24/2021 04:50 PM   HGBA1C 6.9 (H) 06/15/2021 03:18 PM   HGBA1C 7.5 02/12/2019 12:00 AM   MICROALBUR 10 12/02/2020 03:31 PM    Kidney Function Lab Results  Component Value Date/Time   CREATININE 0.85 09/24/2021 04:50 PM   CREATININE 0.76 06/15/2021 03:18 PM   GFRNONAA 60 05/12/2020 02:40 PM   GFRAA 69 05/12/2020 02:40 PM    Adherence Review: Is the patient currently on a STATIN medication? Yes Is the patient currently on ACE/ARB medication? Yes Does the patient have >5 day gap between last estimated fill dates? No  11-18-2021: 1st attempt left VM 11-24-2021: 2nd attempt left VM 11-26-2021: 3rd attempt left VM  Care Gaps: Last eye exam / Retinopathy Screening? Non Last Annual Wellness Visit? 12-16-2020 Last Diabetic Foot Exam? None  Star Rating Drugs: Lisinopril 40 mg- Last filled 09-11-2021 90 DS  Rosuvastatin 10 mg- Last filled 09-11-2021 90 DS  Trulicity 1.5 mg- Patient assistance  [place] Pharmacist Assistant [phone]

## 2021-11-19 ENCOUNTER — Telehealth: Payer: Self-pay

## 2021-11-19 NOTE — Chronic Care Management (AMB) (Signed)
Novo Nordisk patient assistance program notification:  120- day supply of Rybelsus 7 mg was filled on 10/21/2021 and should arrive to the office in 10-14 business days. Patient has 3  refill remaining and enrollment will expire on 03/04/2022.  The next refill for patient will be fulfilled on 12/03/2021.  Pattricia Boss, [place] Pharmacist Assistant [phone]

## 2021-11-19 NOTE — Chronic Care Management (AMB) (Signed)
Novo Nordisk patient assistance program notification:  120- day supply of Tresiba 100 u/ml will be filled on 12/10/2021 and should arrive to the office in 10-14 business days.    Pattricia Boss, [place] Pharmacist Assistant [phone]

## 2021-12-03 ENCOUNTER — Telehealth: Payer: Self-pay

## 2021-12-03 NOTE — Progress Notes (Signed)
Chronic Care Management Pharmacy Assistant   Name: Christy Crawford  MRN: [id number] DOB: 09-Mar-1956   Reason for Encounter: Medication Coordination for Upstream   Recent office visits:  None  Recent consult visits:  None  Hospital visits:  None  Medications: Outpatient Encounter Medications as of 12/03/2021  Medication Sig   acetaminophen (TYLENOL) 325 MG tablet Take 325 mg by mouth every 6 (six) hours as needed.   ALPRAZolam (XANAX) 1 MG tablet Take 1 mg by mouth in the morning, at noon, in the evening, and at bedtime.    amitriptyline (ELAVIL) 25 MG tablet Take 1 tablet (25 mg total) by mouth at bedtime.   Blood Glucose Monitoring Suppl (ONETOUCH PING METER REMOTE) Supplies MISC 1 each by Does not apply route 4 (four) times daily -  before meals and at bedtime.   Blood Glucose Monitoring Suppl (ONETOUCH VERIO FLEX SYSTEM) w/Device KIT USE TO check blood glucose AS DIRECTED   COMFORT EZ PEN NEEDLES 31G X 5 MM MISC E11.44 use new needle with each new injection   Dulaglutide (TRULICITY) 1.5 OQ/9.4TM SOPN Inject 1.5 mg into the skin once a week.   furosemide (LASIX) 20 MG tablet Take 1 tablet (20 mg total) by mouth daily.   GEMTESA 75 MG TABS TAKE ONE TABLET BY MOUTH ONCE DAILY   glucose blood test strip Use as instructed   insulin degludec (TRESIBA FLEXTOUCH) 100 UNIT/ML FlexTouch Pen Inject 65 Units into the skin daily.   Lancets (ONETOUCH ULTRASOFT) lancets Use as instructed   lisinopril (ZESTRIL) 40 MG tablet TAKE ONE TABLET BY MOUTH EVERY MORNING and TAKE ONE TABLET BY MOUTH EVERYDAY AT BEDTIME   montelukast (SINGULAIR) 10 MG tablet Take 1 tablet by mouth daily   Multiple Vitamins-Minerals (CENTRUM SILVER PO) Take 1 tablet by mouth daily.   NOVOFINE PLUS PEN NEEDLE 32G X 4 MM MISC    pregabalin (LYRICA) 300 MG capsule Take 1 capsule (300 mg total) by mouth every evening.   rosuvastatin (CRESTOR) 10 MG tablet TAKE ONE TABLET BY MOUTH ONCE DAILY.   sertraline (ZOLOFT) 100 MG  tablet Take 1 tablet (100 mg total) by mouth 2 (two) times daily. (Patient taking differently: Take 200 mg by mouth daily.)   Vitamin D, Cholecalciferol, 25 MCG (1000 UT) CAPS Take 2,000 Int'l Units by mouth.   No facility-administered encounter medications on file as of 12/03/2021.    Reviewed chart for medication changes ahead of medication coordination call.  No OVs, Consults, or hospital visits since last care coordination call/Pharmacist visit.   No medication changes indicated OR if recent visit, treatment plan here.  BP Readings from Last 3 Encounters:  09/24/21 124/68  06/15/21 136/82  03/09/21 114/74    Lab Results  Component Value Date   HGBA1C 6.8 (H) 09/24/2021     Patient obtains medications through Vials  90 Days   Last adherence delivery included: Gemtesa 75 mg 1 tab once daily Lisinopril 40 mg 1 tab at breakfast and bedtime Montelukast 10 mg 1 tab at bedtime Rosuvastatin 10 mg 1 tab once daily Sertraline 100 mg 2 tabs once daily Pregabalin 300 mg 1 capsule every evening Amitriptyline 25 mg 1 tab at bedtime Xanax 1 mg 1 tab four times daily prn  Prodigy No Coding Test Strips Prodigy No Coding Lancets  Furosemide $RemoveBef'20mg'KchccPHIKO$ - Take 1 tablet daily   Patient declined (meds) last delivery Tresiba Flextouch Gets through PAP Rybelsus $RemoveBefo'7mg'BhruMGAjboZ$  -Pt stated she was only taking this due to having  to wait for her Trulicity through PAP. Pt is notw receiving Trulicity and has stopped this completely.  Trulicity $RemoveBe'3mg'bVygYvkEQ$ - PAP  Patient is due for next adherence delivery on: 12/16/21. Called patient and reviewed medications and coordinated delivery.  This delivery to include: Gemtesa 75 mg 1 tab once daily Lisinopril 40 mg 1 tab at breakfast and bedtime Montelukast 10 mg 1 tab at bedtime Rosuvastatin 10 mg 1 tab once daily Sertraline 100 mg 2 tabs once daily Pregabalin 300 mg 1 capsule every evening Amitriptyline 25 mg 1 tab at bedtime Xanax 1 mg 1 tab four times daily prn  Furosemide  $RemoveBef'20mg'WBTLPUiDDw$ - Take 1 tablet daily  onetouch verio test strips   onetouch delica lancets   Tyler Aas Flextouch Gets through PAP Trulicity $RemoveBefor'3mg'TdtyLsMTypsy$ - PAP)   Patient declined the following medications  Unable to get a hold of pt   Patient needs refills-Request Sent  Furosemide $RemoveBef'20mg'kGQnhXwtfT$  Pregabalin 300 mg  Amitriptyline 25 mg  Rosuvastatin 10 mg Montelukast 10 mg  Unable to confirm delivery, was not able to get a hold of pt   Elray Mcgregor, [place] Pharmacist Assistant  [phone]

## 2021-12-08 ENCOUNTER — Other Ambulatory Visit: Payer: Self-pay

## 2021-12-08 MED ORDER — AMITRIPTYLINE HCL 25 MG PO TABS: 25.0000 mg | ORAL_TABLET | Freq: Every day | ORAL | 1 refills | Status: DC

## 2021-12-08 MED ORDER — PREGABALIN 300 MG PO CAPS: 300.0000 mg | ORAL_CAPSULE | Freq: Every evening | ORAL | 1 refills | Status: DC

## 2021-12-08 MED ORDER — ROSUVASTATIN CALCIUM 10 MG PO TABS: ORAL_TABLET | ORAL | 1 refills | Status: DC

## 2021-12-08 MED ORDER — MONTELUKAST SODIUM 10 MG PO TABS: ORAL_TABLET | ORAL | 1 refills | Status: DC

## 2021-12-08 MED ORDER — FUROSEMIDE 20 MG PO TABS: 20.0000 mg | ORAL_TABLET | Freq: Every day | ORAL | 1 refills | Status: DC

## 2021-12-10 ENCOUNTER — Other Ambulatory Visit: Payer: Self-pay | Admitting: Family Medicine

## 2021-12-11 ENCOUNTER — Telehealth: Payer: Self-pay

## 2021-12-11 NOTE — Chronic Care Management (AMB) (Signed)
Medication Review:  Received  a message from [place] regarding one of patient's medications, Xanax. Dr [facility] office is denying refill until follow up appointment is scheduled. Unable to deliver this medication on 12/16/2021. Called patient to inform, no answer, left message on patient voicemail to contact Dr Starleen Arms office for an appointment and prescription refill.   Pattricia Boss, [place] Pharmacist Assistant [phone]

## 2021-12-28 ENCOUNTER — Ambulatory Visit (INDEPENDENT_AMBULATORY_CARE_PROVIDER_SITE_OTHER): Payer: Medicare Other | Admitting: Family Medicine

## 2021-12-28 VITALS — BP 120/72 | HR 80 | Temp 97.1°F | Resp 14 | Ht 72.0 in

## 2021-12-28 DIAGNOSIS — S81811A Laceration without foreign body, right lower leg, initial encounter: Secondary | ICD-10-CM | POA: Diagnosis not present

## 2021-12-28 DIAGNOSIS — E782 Mixed hyperlipidemia: Secondary | ICD-10-CM | POA: Diagnosis not present

## 2021-12-28 DIAGNOSIS — Z23 Encounter for immunization: Secondary | ICD-10-CM

## 2021-12-28 DIAGNOSIS — E66813 Obesity, class 3: Secondary | ICD-10-CM

## 2021-12-28 DIAGNOSIS — I152 Hypertension secondary to endocrine disorders: Secondary | ICD-10-CM | POA: Diagnosis not present

## 2021-12-28 DIAGNOSIS — E1159 Type 2 diabetes mellitus with other circulatory complications: Secondary | ICD-10-CM

## 2021-12-28 DIAGNOSIS — E1144 Type 2 diabetes mellitus with diabetic amyotrophy: Secondary | ICD-10-CM

## 2021-12-28 DIAGNOSIS — Z6841 Body Mass Index (BMI) 40.0 and over, adult: Secondary | ICD-10-CM

## 2021-12-28 DIAGNOSIS — F330 Major depressive disorder, recurrent, mild: Secondary | ICD-10-CM

## 2021-12-28 NOTE — Progress Notes (Unsigned)
Subjective:  Patient ID: Christy Crawford, female    DOB: Oct 12, 1955  Age: 66 y.o. MRN: [id number]  Chief Complaint  Patient presents with   Hypertension   Diabetes    HPI  Diabetes:  Complications: Diabetic macular edema. Amyotropy/neuropathy. Glucose checking: Patient checks sugars three times daily. Glucose logs: lowest 70s and highest 150s Hypoglycemia: a few readings in the 70's, but patient does not have symptoms or feel like its low.  Most recent A1C: 7.3 Current medications: Patient is  still currently taking Trulicity 1.5mg  once weekly. Also taking Tresiba 65 units qhs, Lyrica 300 mg one tablet twice daily. Last Eye Exam. 01/27/2021.Marland Kitchen Seeing every 6 months. Foot checks: Patient checks her feet daily.  Hyperlipidemia: Current medications: Patient is currently taking Rosuvastatin 10 mg take 1 tablet daily.  Hypertension: Current medications: Patient is currently taking Lisinopril 40 mg twice daily.  Depression: Patient is currently taking Zoloft 100 mg twice daily. On xanax 1 mg oral four times a day. Sees Dr Barnett Abu every 6 months.   Urge incontinence: on gemtesa 75 mg daily. Doing very well.  Diet: healthy Exercise: Patient is unable to exercise due to Amyotrophy/Neuropathy.   Current Outpatient Medications on File Prior to Visit  Medication Sig Dispense Refill   acetaminophen (TYLENOL) 325 MG tablet Take 325 mg by mouth every 6 (six) hours as needed.     ALPRAZolam (XANAX) 1 MG tablet Take 1 mg by mouth in the morning, at noon, in the evening, and at bedtime.      amitriptyline (ELAVIL) 25 MG tablet Take 1 tablet (25 mg total) by mouth at bedtime. 90 tablet 1   Blood Glucose Monitoring Suppl (ONETOUCH PING METER REMOTE) Supplies MISC 1 each by Does not apply route 4 (four) times daily -  before meals and at bedtime. 1 each 0   Blood Glucose Monitoring Suppl (ONETOUCH VERIO FLEX SYSTEM) w/Device KIT USE TO check blood glucose AS DIRECTED     COMFORT EZ PEN  NEEDLES 31G X 5 MM MISC E11.44 use new needle with each new injection 100 each 2   Dulaglutide (TRULICITY) 1.5 MG/0.5ML SOPN Inject 1.5 mg into the skin once a week.     furosemide (LASIX) 20 MG tablet Take 1 tablet (20 mg total) by mouth daily. 90 tablet 1   GEMTESA 75 MG TABS TAKE ONE TABLET BY MOUTH ONCE DAILY 90 tablet 1   glucose blood test strip Use as instructed 100 each 12   insulin degludec (TRESIBA FLEXTOUCH) 100 UNIT/ML FlexTouch Pen Inject 65 Units into the skin daily. (Patient taking differently: Inject 60 Units into the skin daily.) 60 mL 0   Lancets (ONETOUCH ULTRASOFT) lancets Use as instructed 100 each 12   lisinopril (ZESTRIL) 40 MG tablet TAKE ONE TABLET BY MOUTH EVERY MORNING and TAKE ONE TABLET BY MOUTH EVERYDAY AT BEDTIME 180 tablet 1   montelukast (SINGULAIR) 10 MG tablet Take 1 tablet by mouth daily 90 tablet 1   Multiple Vitamins-Minerals (CENTRUM SILVER PO) Take 1 tablet by mouth daily.     NOVOFINE PLUS PEN NEEDLE 32G X 4 MM MISC      pregabalin (LYRICA) 300 MG capsule TAKE ONE CAPSULE BY MOUTH EVERY EVENING 90 capsule 1   rosuvastatin (CRESTOR) 10 MG tablet TAKE ONE TABLET BY MOUTH ONCE DAILY. 90 tablet 1   sertraline (ZOLOFT) 100 MG tablet Take 1 tablet (100 mg total) by mouth 2 (two) times daily. (Patient taking differently: Take 200 mg by mouth  daily.) 180 tablet 1   Vitamin D, Cholecalciferol, 25 MCG (1000 UT) CAPS Take 2,000 Int'l Units by mouth.     No current facility-administered medications on file prior to visit.   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)    Essential hypertension    Fibromyalgia    Generalized edema    Hyperlipidemia    Hypertension    Major depression    Migraine headache    Mixed hyperlipidemia    Sleep apnea    Telogen effluvium    Past  Surgical History:  Procedure Laterality Date   CHOLECYSTECTOMY     lens implants Bilateral     Family History  Adopted: Yes  Problem Relation Age of Onset   Diabetes Maternal Aunt    Social History   Socioeconomic History   Marital status: Married    Spouse name: Not on file   Number of children: 1   Years of education: Not on file   Highest education level: Not on file  Occupational History   Occupation: disabled  Tobacco Use   Smoking status: Former    Types: Cigarettes    Quit date: 2000    Years since quitting: 23.7   Smokeless tobacco: Never  Vaping Use   Vaping Use: Never used  Substance and Sexual Activity   Alcohol use: Yes    Alcohol/week: 1.0 standard drink of alcohol    Types: 1 Glasses of wine per week    Comment: socially   Drug use: Never   Sexual activity: Not on file  Other Topics Concern   Not on file  Social History Narrative   Not on file   Social Determinants of Health   Financial Resource Strain: Not on file  Food Insecurity: No Food Insecurity (11/14/2019)   Hunger Vital Sign    Worried About Running Out of Food in the Last Year: Never true    Ran Out of Food in the Last Year: Never true  Transportation Needs: No Transportation Needs (11/14/2019)   PRAPARE - Administrator, Civil Service (Medical): No    Lack of Transportation (Non-Medical): No  Physical Activity: Not on file  Stress: Not on file  Social Connections: Not on file    Review of Systems  Constitutional:  Positive for fatigue. Negative for chills and fever.  HENT:  Negative for congestion, rhinorrhea and sore throat.   Respiratory:  Negative for cough and shortness of breath.   Cardiovascular:  Negative for chest pain.  Gastrointestinal:  Negative for abdominal pain, constipation, diarrhea, nausea and vomiting.  Genitourinary:  Negative for dysuria and urgency.  Musculoskeletal:  Positive for arthralgias (left shoulder, left hip and left knee pain) and back  pain. Negative for myalgias.  Neurological:  Positive for headaches. Negative for dizziness, weakness and light-headedness.  Psychiatric/Behavioral:  Negative for dysphoric mood. The patient is not nervous/anxious.      Objective:  BP 120/72   Pulse 80   Temp (!) 97.1 F (36.2 C)   Resp 14   Ht 6' (1.829 m)   BMI 41.50 kg/m      12/28/2021    1:36 PM 09/24/2021    3:49 PM 09/24/2021    3:14 PM  BP/Weight  Systolic BP 120 124 152  Diastolic BP 72 68 82  Wt. (Lbs)   306  BMI   41.5 kg/m2    Physical Exam Vitals reviewed.  Constitutional:      Appearance: Normal appearance. She is obese.  Neck:     Vascular: No carotid bruit.  Cardiovascular:     Rate and Rhythm: Normal rate and regular rhythm.     Heart sounds: Normal heart sounds.  Pulmonary:     Effort: Pulmonary effort is normal. No respiratory distress.     Breath sounds: Normal breath sounds.  Abdominal:     General: Abdomen is flat. Bowel sounds are normal.     Palpations: Abdomen is soft.     Tenderness: There is no abdominal tenderness.  Skin:    Comments: Abrasion on right lower leg and abrasion on right abdomen. Area cleansed and bandaged.  Neurological:     Mental Status: She is alert and oriented to person, place, and time.  Psychiatric:        Mood and Affect: Mood normal.        Behavior: Behavior normal.     Diabetic Foot Exam - Simple   Simple Foot Form  12/28/2021 11:28 PM  Visual Inspection See comments: Yes Sensation Testing See comments: Yes Pulse Check Posterior Tibialis and Dorsalis pulse intact bilaterally: Yes Comments Decreased sensation BL feet  Dry skin. Calluses.       Lab Results  Component Value Date   WBC 7.0 12/28/2021   HGB 14.6 12/28/2021   HCT 42.7 12/28/2021   PLT 188 12/28/2021   GLUCOSE 76 12/28/2021   CHOL 142 12/28/2021   TRIG 132 12/28/2021   HDL 64 12/28/2021   LDLCALC 55 12/28/2021   ALT 21 12/28/2021   AST 32 12/28/2021   NA 142 12/28/2021   K 4.5  12/28/2021   CL 103 12/28/2021   CREATININE 0.82 12/28/2021   BUN 16 12/28/2021   CO2 25 12/28/2021   TSH 4.060 06/13/2019   HGBA1C 6.5 (H) 12/28/2021   MICROALBUR 10 12/02/2020      Assessment & Plan:   Problem List Items Addressed This Visit       Cardiovascular and Mediastinum   Hypertension associated with diabetes ([place]) - Primary    Well controlled.  No changes to medicines.  Continue to work on eating a healthy diet and exercise.  Labs drawn today.        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 Recommend check feet daily. Recommend annual eye exams. Medicines: Continue Trulicity 1.5mg  once weekly. Also taking Tresiba 65 units qhs, Lyrica 300 mg one tablet twice daily. Continue to work on eating a healthy diet and exercise.  Labs drawn today.         Relevant Orders   Hemoglobin A1c (Completed)     Other   Mixed hyperlipidemia    Well controlled.  No changes to medicines.  Continue to work on eating a healthy diet and exercise.  Labs drawn today.        Relevant Orders   Lipid panel (Completed)   Class 3 severe obesity due to excess calories with serious comorbidity and body mass index (BMI) of 40.0 to 44.9 in adult [facility])    Recommend continue to work on eating healthy diet and exercise.       Mild recurrent major depression ([place])  The current medical regimen is effective;  continue present plan and medications. Continue Zoloft 100 mg twice daily and xanax 1 mg oral four times a day. Sees Dr Marquis Lunch every 6 months.       Need for tetanus booster   Relevant Orders   Tdap vaccine greater than or equal to 7yo IM (Completed)   Need for influenza vaccination   Relevant Orders   Flu Vaccine QUAD High Dose(Fluad) (Completed)   Laceration of right leg excluding thigh,  initial encounter    Bandaged. tdap given.      .  No orders of the defined types were placed in this encounter.   Orders Placed This Encounter  Procedures   Tdap vaccine greater than or equal to 7yo IM   Flu Vaccine QUAD High Dose(Fluad)   CBC   Comprehensive metabolic panel   Hemoglobin A1c   Lipid panel   Cardiovascular Risk Assessment     Follow-up: Return in about 4 months (around 04/29/2022).  An After Visit Summary was printed and given to the patient.  [name], MD [facility] [phone]

## 2021-12-29 ENCOUNTER — Encounter: Payer: Self-pay | Admitting: Family Medicine

## 2021-12-29 DIAGNOSIS — S81811A Laceration without foreign body, right lower leg, initial encounter: Secondary | ICD-10-CM | POA: Insufficient documentation

## 2021-12-29 DIAGNOSIS — Z23 Encounter for immunization: Secondary | ICD-10-CM | POA: Insufficient documentation

## 2021-12-29 LAB — COMPREHENSIVE METABOLIC PANEL
ALT: 21 IU/L (ref 0–32)
AST: 32 IU/L (ref 0–40)
Albumin/Globulin Ratio: 2 (ref 1.2–2.2)
Albumin: 4.3 g/dL (ref 3.9–4.9)
Alkaline Phosphatase: 101 IU/L (ref 44–121)
BUN/Creatinine Ratio: 20 (ref 12–28)
BUN: 16 mg/dL (ref 8–27)
Bilirubin Total: 0.3 mg/dL (ref 0.0–1.2)
CO2: 25 mmol/L (ref 20–29)
Calcium: 9.5 mg/dL (ref 8.7–10.3)
Chloride: 103 mmol/L (ref 96–106)
Creatinine, Ser: 0.82 mg/dL (ref 0.57–1.00)
Globulin, Total: 2.1 g/dL (ref 1.5–4.5)
Glucose: 76 mg/dL (ref 70–99)
Potassium: 4.5 mmol/L (ref 3.5–5.2)
Sodium: 142 mmol/L (ref 134–144)
Total Protein: 6.4 g/dL (ref 6.0–8.5)
eGFR: 78 mL/min/{1.73_m2} (ref >59)

## 2021-12-29 LAB — HEMOGLOBIN A1C
Est. average glucose Bld gHb Est-mCnc: 140 mg/dL
Hgb A1c MFr Bld: 6.5 % — ABNORMAL HIGH (ref 4.8–5.6)

## 2021-12-29 LAB — LIPID PANEL
Chol/HDL Ratio: 2.2 ratio (ref 0.0–4.4)
Cholesterol, Total: 142 mg/dL (ref 100–199)
HDL: 64 mg/dL (ref >39)
LDL Chol Calc (NIH): 55 mg/dL (ref 0–99)
Triglycerides: 132 mg/dL (ref 0–149)
VLDL Cholesterol Cal: 23 mg/dL (ref 5–40)

## 2021-12-29 LAB — CBC
Hematocrit: 42.7 % (ref 34.0–46.6)
Hemoglobin: 14.6 g/dL (ref 11.1–15.9)
MCH: 29 pg (ref 26.6–33.0)
MCHC: 34.2 g/dL (ref 31.5–35.7)
MCV: 85 fL (ref 79–97)
Platelets: 188 10*3/uL (ref 150–450)
RBC: 5.04 x10E6/uL (ref 3.77–5.28)
RDW: 14.2 % (ref 11.7–15.4)
WBC: 7 10*3/uL (ref 3.4–10.8)

## 2021-12-29 LAB — CARDIOVASCULAR RISK ASSESSMENT

## 2021-12-29 NOTE — Assessment & Plan Note (Signed)
The current medical regimen is effective;  continue present plan and medications. Continue Zoloft 100 mg twice daily and xanax 1 mg oral four times a day. Sees Dr Rupindar Kaur every 6 months.  

## 2021-12-29 NOTE — Assessment & Plan Note (Signed)
Well controlled.  ?No changes to medicines.  ?Continue to work on eating a healthy diet and exercise.  ?Labs drawn today.  ?

## 2021-12-29 NOTE — Assessment & Plan Note (Signed)
Control: good Recommend check sugars fasting daily. Recommend check feet daily. Recommend annual eye exams. Medicines: Continue Trulicity 1.'5mg'$  once weekly. Also taking Tresiba 65 units qhs, Lyrica 300 mg one tablet twice daily. Continue to work on eating a healthy diet and exercise.  Labs drawn today.

## 2021-12-29 NOTE — Progress Notes (Signed)
Blood count normal.  Liver function normal.  Kidney function normal.  Cholesterol: good. HBA1C: 6.5

## 2021-12-29 NOTE — Assessment & Plan Note (Signed)
Bandaged. tdap given.

## 2021-12-29 NOTE — Assessment & Plan Note (Signed)
Recommend continue to work on eating healthy diet and exercise.  

## 2022-01-05 ENCOUNTER — Telehealth: Payer: Self-pay

## 2022-01-05 NOTE — Chronic Care Management (AMB) (Signed)
[employer] patient assistance program notification:  120- day supply of Tresiba 100u/ml was filled on 12/14/2021 and should arrive to the office in 10-14 business days. Patient has 0  refill remaining and enrollment will expire on 03/04/2022.  The next refill for patient will be fulfilled on 02/28/2022.  Filling out reorder form for patient to receive a refill before end of enrollment year.   Pattricia Boss, [place] Pharmacist Assistant [phone]

## 2022-01-11 ENCOUNTER — Telehealth: Payer: Self-pay

## 2022-01-11 NOTE — Progress Notes (Signed)
2024 PAP Renewals for Tyler Aas and Trulicity have been started through [place] and uploaded to be mailed out.   Elray Mcgregor, [place] Pharmacist Assistant  [phone]

## 2022-01-15 ENCOUNTER — Telehealth: Payer: Self-pay

## 2022-01-15 NOTE — Chronic Care Management (AMB) (Signed)
Faxed reorder form for [place] and Novofine needles to [employer] patient assistance.  Pattricia Boss, [place] Pharmacist Assistant [phone]

## 2022-02-07 DIAGNOSIS — I10 Essential (primary) hypertension: Secondary | ICD-10-CM | POA: Diagnosis not present

## 2022-02-07 DIAGNOSIS — E1140 Type 2 diabetes mellitus with diabetic neuropathy, unspecified: Secondary | ICD-10-CM | POA: Diagnosis not present

## 2022-02-07 DIAGNOSIS — Z7985 Long-term (current) use of injectable non-insulin antidiabetic drugs: Secondary | ICD-10-CM | POA: Diagnosis not present

## 2022-02-07 DIAGNOSIS — R531 Weakness: Secondary | ICD-10-CM | POA: Diagnosis not present

## 2022-02-07 DIAGNOSIS — I2510 Atherosclerotic heart disease of native coronary artery without angina pectoris: Secondary | ICD-10-CM | POA: Diagnosis not present

## 2022-02-07 DIAGNOSIS — I472 Ventricular tachycardia, unspecified: Secondary | ICD-10-CM | POA: Diagnosis not present

## 2022-02-07 DIAGNOSIS — D72829 Elevated white blood cell count, unspecified: Secondary | ICD-10-CM | POA: Diagnosis not present

## 2022-02-07 DIAGNOSIS — E10311 Type 1 diabetes mellitus with unspecified diabetic retinopathy with macular edema: Secondary | ICD-10-CM | POA: Diagnosis not present

## 2022-02-07 DIAGNOSIS — R197 Diarrhea, unspecified: Secondary | ICD-10-CM | POA: Diagnosis not present

## 2022-02-07 DIAGNOSIS — I440 Atrioventricular block, first degree: Secondary | ICD-10-CM | POA: Diagnosis not present

## 2022-02-07 DIAGNOSIS — I517 Cardiomegaly: Secondary | ICD-10-CM | POA: Diagnosis not present

## 2022-02-07 DIAGNOSIS — I639 Cerebral infarction, unspecified: Secondary | ICD-10-CM | POA: Diagnosis not present

## 2022-02-07 DIAGNOSIS — M47812 Spondylosis without myelopathy or radiculopathy, cervical region: Secondary | ICD-10-CM | POA: Diagnosis not present

## 2022-02-07 DIAGNOSIS — Z794 Long term (current) use of insulin: Secondary | ICD-10-CM | POA: Diagnosis not present

## 2022-02-07 DIAGNOSIS — E1144 Type 2 diabetes mellitus with diabetic amyotrophy: Secondary | ICD-10-CM | POA: Diagnosis not present

## 2022-02-07 DIAGNOSIS — E7220 Disorder of urea cycle metabolism, unspecified: Secondary | ICD-10-CM | POA: Diagnosis not present

## 2022-02-07 DIAGNOSIS — E1010 Type 1 diabetes mellitus with ketoacidosis without coma: Secondary | ICD-10-CM | POA: Diagnosis not present

## 2022-02-07 DIAGNOSIS — R404 Transient alteration of awareness: Secondary | ICD-10-CM | POA: Diagnosis not present

## 2022-02-07 DIAGNOSIS — Z79899 Other long term (current) drug therapy: Secondary | ICD-10-CM | POA: Diagnosis not present

## 2022-02-07 DIAGNOSIS — G4733 Obstructive sleep apnea (adult) (pediatric): Secondary | ICD-10-CM | POA: Diagnosis not present

## 2022-02-07 DIAGNOSIS — M797 Fibromyalgia: Secondary | ICD-10-CM | POA: Diagnosis not present

## 2022-02-07 DIAGNOSIS — M47816 Spondylosis without myelopathy or radiculopathy, lumbar region: Secondary | ICD-10-CM | POA: Diagnosis not present

## 2022-02-07 DIAGNOSIS — I672 Cerebral atherosclerosis: Secondary | ICD-10-CM | POA: Diagnosis not present

## 2022-02-07 DIAGNOSIS — K7460 Unspecified cirrhosis of liver: Secondary | ICD-10-CM | POA: Diagnosis not present

## 2022-02-07 DIAGNOSIS — E11319 Type 2 diabetes mellitus with unspecified diabetic retinopathy without macular edema: Secondary | ICD-10-CM | POA: Diagnosis not present

## 2022-02-07 DIAGNOSIS — I700 Atherosclerosis of aorta: Secondary | ICD-10-CM | POA: Diagnosis not present

## 2022-02-07 DIAGNOSIS — F32A Depression, unspecified: Secondary | ICD-10-CM | POA: Diagnosis not present

## 2022-02-07 DIAGNOSIS — I499 Cardiac arrhythmia, unspecified: Secondary | ICD-10-CM | POA: Diagnosis not present

## 2022-02-07 DIAGNOSIS — E785 Hyperlipidemia, unspecified: Secondary | ICD-10-CM | POA: Diagnosis not present

## 2022-02-07 DIAGNOSIS — G9341 Metabolic encephalopathy: Secondary | ICD-10-CM | POA: Diagnosis not present

## 2022-02-07 DIAGNOSIS — G9340 Encephalopathy, unspecified: Secondary | ICD-10-CM | POA: Diagnosis not present

## 2022-02-07 DIAGNOSIS — G894 Chronic pain syndrome: Secondary | ICD-10-CM | POA: Diagnosis not present

## 2022-02-07 DIAGNOSIS — E1110 Type 2 diabetes mellitus with ketoacidosis without coma: Secondary | ICD-10-CM | POA: Diagnosis not present

## 2022-02-07 DIAGNOSIS — K439 Ventral hernia without obstruction or gangrene: Secondary | ICD-10-CM | POA: Diagnosis not present

## 2022-02-07 DIAGNOSIS — E1040 Type 1 diabetes mellitus with diabetic neuropathy, unspecified: Secondary | ICD-10-CM | POA: Diagnosis not present

## 2022-02-07 DIAGNOSIS — R6889 Other general symptoms and signs: Secondary | ICD-10-CM | POA: Diagnosis not present

## 2022-02-07 DIAGNOSIS — I6523 Occlusion and stenosis of bilateral carotid arteries: Secondary | ICD-10-CM | POA: Diagnosis not present

## 2022-02-07 DIAGNOSIS — I129 Hypertensive chronic kidney disease with stage 1 through stage 4 chronic kidney disease, or unspecified chronic kidney disease: Secondary | ICD-10-CM | POA: Diagnosis not present

## 2022-02-07 DIAGNOSIS — R1110 Vomiting, unspecified: Secondary | ICD-10-CM | POA: Diagnosis not present

## 2022-02-07 DIAGNOSIS — Z743 Need for continuous supervision: Secondary | ICD-10-CM | POA: Diagnosis not present

## 2022-02-11 ENCOUNTER — Telehealth: Payer: Self-pay | Admitting: *Deleted

## 2022-02-11 ENCOUNTER — Encounter: Payer: Self-pay | Admitting: *Deleted

## 2022-02-11 NOTE — Patient Outreach (Signed)
  Care Coordination TOC Note Transition Care Management Unsuccessful Follow-up Telephone Call  Date of discharge and from where:  Wednesday, 02/10/22- unknown location per review of KPN tool  Attempts:  1st Attempt  Reason for unsuccessful TCM follow-up call:  Left voice message  Oneta Rack, RN, BSN, CCRN Alumnus RN CM Care Coordination/ Transition of [place] Management [phone]: direct office

## 2022-02-12 ENCOUNTER — Telehealth: Payer: Self-pay | Admitting: *Deleted

## 2022-02-12 ENCOUNTER — Encounter: Payer: Self-pay | Admitting: *Deleted

## 2022-02-12 NOTE — Patient Outreach (Signed)
  Care Coordination TOC Note Transition Care Management Unsuccessful Follow-up Telephone Call  Date of discharge and from where:  Wednesday, 02/10/22- [place]; nausea, vomiting/ DKA/ ventral hernia  Attempts:  2nd Attempt  Reason for unsuccessful TCM follow-up call:  Left voice message  Oneta Rack, RN, BSN, CCRN Alumnus RN CM Care Coordination/ Transition of [place] Management [phone]: direct office

## 2022-02-15 ENCOUNTER — Encounter: Payer: Self-pay | Admitting: *Deleted

## 2022-02-15 ENCOUNTER — Other Ambulatory Visit: Payer: Self-pay

## 2022-02-15 ENCOUNTER — Telehealth: Payer: Self-pay | Admitting: *Deleted

## 2022-02-15 NOTE — Patient Outreach (Signed)
  Care Coordination TOC Note Transition Care Management Unsuccessful Follow-up Telephone Call  Date of discharge and from where:  Wednesday, 02/10/22 [facility]; N/V, DKA, ventral hernia  Attempts:  3rd Attempt  Reason for unsuccessful TCM follow-up call:  Left voice message  Oneta Rack, RN, BSN, CCRN Alumnus RN CM Care Coordination/ Transition of [place] Management [phone]: direct office

## 2022-02-15 NOTE — Telephone Encounter (Signed)
Christy Crawford called requesting an antibiotic for a possible recurrent UTI.  She was recently admitted to [facility] and thinks her symptoms are returning.  She is experiencing burning and urgency with urination.  Symptoms were reviewed with Dr. Tobie Poet and I called her back to schedule a hospital follow-up.   I was unable to speak to her but I scheduled her for Thursday morning.  She was instructed to call us back if she is unable to keep that appointment.

## 2022-02-18 ENCOUNTER — Inpatient Hospital Stay: Payer: Medicare Other | Admitting: Family Medicine

## 2022-03-04 ENCOUNTER — Telehealth: Payer: Self-pay

## 2022-03-04 NOTE — Progress Notes (Signed)
Chronic Care Management Pharmacy Assistant   Name: JETTIE MANNOR  MRN: [id number] DOB: 12-04-1955   Reason for Encounter: Medication Coordination for [facility]    Recent office visits:  12/28/21 [name] MD. Seen for routine visit. No med changes.  Recent consult visits:  None  Hospital visits:  Medication Reconciliation was completed by comparing discharge summary, patient's EMR and Pharmacy list, and upon discussion with patient.  Admitted to the hospital on 02/07/22 due to Emesis. Discharge date was 02/10/22. Discharged from [place]?Medications Started at [facility] Discharge:?? -started lactulose 20 gram/30 mL solution  -omeprazole 20 MG DR capsule   Medication Changes at Hospital Discharge: -Changed ALPRAZolam 1 MG tablet What changed: when to take this Dose: 1 mg Instructions: Take 1 tablet (1 mg total) by mouth 3 (three) times daily as needed.  ?  -All other medications will remain the same.    Medications: Outpatient Encounter Medications as of 03/04/2022  Medication Sig   acetaminophen (TYLENOL) 325 MG tablet Take 325 mg by mouth every 6 (six) hours as needed.   ALPRAZolam (XANAX) 1 MG tablet Take 1 mg by mouth in the morning, at noon, in the evening, and at bedtime.    amitriptyline (ELAVIL) 25 MG tablet Take 1 tablet (25 mg total) by mouth at bedtime.   Blood Glucose Monitoring Suppl (ONETOUCH PING METER REMOTE) Supplies MISC 1 each by Does not apply route 4 (four) times daily -  before meals and at bedtime.   Blood Glucose Monitoring Suppl (ONETOUCH VERIO FLEX SYSTEM) w/Device KIT USE TO check blood glucose AS DIRECTED   COMFORT EZ PEN NEEDLES 31G X 5 MM MISC E11.44 use new needle with each new injection   Dulaglutide (TRULICITY) 1.5 EQ/6.8TM SOPN Inject 1.5 mg into the skin once a week.   furosemide (LASIX) 20 MG tablet Take 1 tablet (20 mg total) by mouth daily.   GEMTESA 75 MG TABS TAKE ONE TABLET BY MOUTH ONCE DAILY   glucose blood  test strip Use as instructed   insulin degludec (TRESIBA FLEXTOUCH) 100 UNIT/ML FlexTouch Pen Inject 65 Units into the skin daily. (Patient taking differently: Inject 60 Units into the skin daily.)   Lancets (ONETOUCH ULTRASOFT) lancets Use as instructed   lisinopril (ZESTRIL) 40 MG tablet TAKE ONE TABLET BY MOUTH EVERY MORNING and TAKE ONE TABLET BY MOUTH EVERYDAY AT BEDTIME   montelukast (SINGULAIR) 10 MG tablet Take 1 tablet by mouth daily   Multiple Vitamins-Minerals (CENTRUM SILVER PO) Take 1 tablet by mouth daily.   NOVOFINE PLUS PEN NEEDLE 32G X 4 MM MISC    pregabalin (LYRICA) 300 MG capsule TAKE ONE CAPSULE BY MOUTH EVERY EVENING   rosuvastatin (CRESTOR) 10 MG tablet TAKE ONE TABLET BY MOUTH ONCE DAILY.   sertraline (ZOLOFT) 100 MG tablet Take 1 tablet (100 mg total) by mouth 2 (two) times daily. (Patient taking differently: Take 200 mg by mouth daily.)   Vitamin D, Cholecalciferol, 25 MCG (1000 UT) CAPS Take 2,000 Int'l Units by mouth.   No facility-administered encounter medications on file as of 03/04/2022.    Reviewed chart for medication changes ahead of medication coordination call.  No  Consults visits since last care coordination call/Pharmacist visit.    BP Readings from Last 3 Encounters:  12/28/21 120/72  09/24/21 124/68  06/15/21 136/82    Lab Results  Component Value Date   HGBA1C 6.5 (H) 12/28/2021     Patient obtains medications through Vials  90 Days   Last adherence delivery included:  Gemtesa 75 mg 1 tab once daily Lisinopril 40 mg 1 tab at breakfast and bedtime Montelukast 10 mg 1 tab at bedtime Rosuvastatin 10 mg 1 tab once daily Sertraline 100 mg 2 tabs once daily Pregabalin 300 mg 1 capsule every evening Amitriptyline 25 mg 1 tab at bedtime Xanax 1 mg 1 tab four times daily prn  Furosemide 84m- Take 1 tablet daily  onetouch verio test strips                     onetouch delica lancets            Patient declined (meds) last delivery Unable  to get a hold of pt    Patient is due for next adherence delivery on: 03/16/22. Called patient and reviewed medications and coordinated delivery.  This delivery to include: Gemtesa 75 mg 1 tab once daily Lisinopril 40 mg 1 tab at breakfast and bedtime Montelukast 10 mg 1 tab at bedtime Rosuvastatin 10 mg 1 tab once daily Sertraline 100 mg 2 tabs once daily Pregabalin 300 mg 1 capsule every evening Amitriptyline 25 mg 1 tab at bedtime Xanax 1 mg 1 tab four times daily prn  onetouch verio test strips                     onetouch delica lancets            Patient declined the following medications  Gave husband the # to [employer] [facility] shipment of pen needles. This was faxed to company on 01/15/22 for reorder. They have about 3 weeks left Tresiba Flextouch Gets through PAP. Pt has enough to last of end of year Trulicity 38m PAP.Pt has enough to last of end of year Furosemide 2064mPt d/c this due to incontinence issues. I will message Dr. CoxTobie Poet inform her.   Patient needs refills-Request Sent  Lisinopril 40 mg  Gemtesa 75 mg  Xanax 1 mg ([place] Sent request)  Confirmed delivery date of 03/16/22, advised patient that pharmacy will contact them the morning of delivery.   DanElray McgregorMAGliddenarmacist Assistant  [phone]

## 2022-03-05 ENCOUNTER — Other Ambulatory Visit: Payer: Self-pay

## 2022-03-05 DIAGNOSIS — N3941 Urge incontinence: Secondary | ICD-10-CM

## 2022-03-06 MED ORDER — LISINOPRIL 40 MG PO TABS: ORAL_TABLET | ORAL | 1 refills | Status: DC

## 2022-03-06 MED ORDER — GEMTESA 75 MG PO TABS: 1.0000 | ORAL_TABLET | Freq: Every day | ORAL | 1 refills | Status: DC

## 2022-03-08 NOTE — Telephone Encounter (Signed)
Compliant on meds. Christy Crawford wrote note stating she'd tell PCP about Furosemide

## 2022-03-09 ENCOUNTER — Other Ambulatory Visit: Payer: Self-pay | Admitting: Family Medicine

## 2022-03-10 ENCOUNTER — Other Ambulatory Visit: Payer: Self-pay | Admitting: Family Medicine

## 2022-03-10 DIAGNOSIS — N3941 Urge incontinence: Secondary | ICD-10-CM

## 2022-03-10 MED ORDER — GEMTESA 75 MG PO TABS: 1.0000 | ORAL_TABLET | Freq: Every day | ORAL | 1 refills | Status: DC

## 2022-03-10 MED ORDER — LISINOPRIL 40 MG PO TABS: ORAL_TABLET | ORAL | 1 refills | Status: DC

## 2022-03-30 ENCOUNTER — Telehealth: Payer: Medicare Other | Admitting: Family Medicine

## 2022-03-30 DIAGNOSIS — Z91199 Patient's noncompliance with other medical treatment and regimen due to unspecified reason: Secondary | ICD-10-CM

## 2022-03-30 NOTE — Progress Notes (Signed)
Both nurse assistant and I tried many times to reach this patient by phone for visit., but we were not able to reach her.  LM to please call back to reschedule.

## 2022-04-13 ENCOUNTER — Encounter: Payer: Self-pay | Admitting: Family Medicine

## 2022-04-13 ENCOUNTER — Telehealth (INDEPENDENT_AMBULATORY_CARE_PROVIDER_SITE_OTHER): Payer: Medicare Other | Admitting: Family Medicine

## 2022-04-13 DIAGNOSIS — Z0000 Encounter for general adult medical examination without abnormal findings: Secondary | ICD-10-CM | POA: Diagnosis not present

## 2022-04-13 NOTE — Progress Notes (Signed)
PATIENT CHECK-IN and HEALTH RISK ASSESSMENT QUESTIONNAIRE:  -completed by phone/video for upcoming Medicare Preventive Visit  Pre-Visit Check-in: 1)Vitals (height, wt, BP, etc) - record in vitals section for visit on day of visit 2)Review and Update Medications, Allergies PMH, Surgeries, Social history in Epic 3)Hospitalizations in the last year with date/reason? 11/23 for nausea/vomiting, diabetic ketoacidosis - started with a stomach bug she thinks, feels has recovered for the most part 4)Review and Update Care Team (patient's specialists) in Epic 5) Complete PHQ9 in Epic  6) Complete Fall Screening in [place] Maintenance Due and order under PCP if not done.  8)Medicare Wellness Questionnaire: Answer theses question about your habits: Do you drink alcohol? none Have you ever smoked? no How many packs a day do/did you smoke?no Do you use smokeless tobacco?no Do you use an illicit drugs? no Do you exercises? none Typical diet: food fixed at home for the most part, fat free/sugar free yogurt, steamed veggies, jasmine rice, boneless skinless chicken breast, some seafood  Answer theses question about you: Can you perform most household chores? Yes and no - ambulation is limited, but she rides around her house on a chair and does a lot of chores in the kitchen/etc. Husband helps her a lot. Oldest daughter helps too.  Do you find it hard to follow a conversation in a noisy room?some trouble with hearing, not interested currently - but considering, wants to check with her insurance first Do you often ask people to speak up or repeat themselves? sometimes Do you feel that you have a problem with memory? Some, some is related to medications she reports Do you balance your checkbook and or bank acounts?no, husband does this Do you feel safe at home? yes Last dentist visit? Does not have teeth  Do you need assistance with any of the following: Please note if so:  Driving? Yes,  husband drives  Feeding yourself?no  Getting from bed to chair? yes  Getting to the toilet? no  Bathing or showering? yes  Dressing yourself? Husband helps  Managing money? Husband does this  Climbing a flight of stairs yes  Preparing meals? no  Do you have Advanced Directives in place (Living Will, Healthcare Power or Attorney)?  No, on the to do list and she and her husband plan to do this soon.    Last eye Exam and location? Yes, reports stays up to day with her ophthalmologist twice per year, Dr. Renaldo Fiddler   Do you currently use prescribed or non-prescribed narcotic or opioid pain medications? none  Do you have a history or close family history of breast, ovarian, tubal or peritoneal cancer or a family member with BRCA (breast cancer susceptibility 1 and 2) gene mutations?  Nurse/Assistant Credentials/time stamp:   ----------------------------------------------------------------------------------------------------------------------------------------------------------------------------------------------------------------------   MEDICARE ANNUAL PREVENTIVE VISIT WITH PROVIDER: (Welcome to [employer], initial annual wellness or annual wellness exam)  Virtual Visit via Phone Note  I connected with Christy Crawford  on 04/29/22 by phone and verified that I am speaking with the correct person using two identifiers.  Location patient: home Location provider:work or home office Persons participating in the virtual visit: patient, provider  Concerns and/or follow up today: Reports has been doing better since hospitalization. Has follow up with PCP later this month. Reports  BS at home has been good - denies lows or highs. In the 90 -low 100s for the most part, had one high sugar in the 200s once in the last 2 months.    See HM  section in Epic for other details of completed HM.    ROS: negative for report of fevers, unintentional weight loss, vision changes, vision loss, hearing loss or change,  chest pain, sob, hemoptysis, melena, hematochezia, hematuria, genital discharge or lesions, falls, bleeding or bruising, loc, thoughts of suicide or self harm  Patient-completed extensive health risk assessment - reviewed and discussed with the patient: See Health Risk Assessment completed with patient prior to the visit either above or in recent phone note. This was reviewed in detailed with the patient today and appropriate recommendations, orders and referrals were placed as needed per Summary below and patient instructions.   Review of Medical History: -PMH, PSH, Family History and current specialty and care providers reviewed and updated and listed below   Patient Care Team: [name], MD as PCP - General (Family Medicine) Marcial Pacas, MD as Consulting Physician (Neurology) Chucky May, MD as Consulting Physician (Psychiatry) Lynnell Dike, OD (Optometry) Burnice Logan, 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[place])    Essential hypertension    Fibromyalgia    Generalized edema    Hyperlipidemia    Hypertension    Major depression    Migraine headache    Mixed hyperlipidemia    Sleep apnea    Telogen effluvium     Past Surgical History:  Procedure Laterality Date   CHOLECYSTECTOMY     lens implants Bilateral     Social History   Socioeconomic History   Marital status: Married    Spouse name: Not on file   Number of children: 1   Years of education: Not on file   Highest education level: Not on file  Occupational History   Occupation: disabled  Tobacco Use   Smoking status: Former    Types: Cigarettes    Quit date: 2000    Years since quitting: 24.0   Smokeless tobacco: Never  Vaping Use   Vaping Use: Never used  Substance  and Sexual Activity   Alcohol use: Yes    Alcohol/week: 1.0 standard drink of alcohol    Types: 1 Glasses of wine per week    Comment: socially   Drug use: Never   Sexual activity: Not on file  Other Topics Concern   Not on file  Social History Narrative   Not on file   Social Determinants of Health   Financial Resource Strain: Not on file  Food Insecurity: No Food Insecurity (11/14/2019)   Hunger Vital Sign    Worried About Running Out of Food in the Last Year: Never true    Ran Out of Food in the Last Year: Never true  Transportation Needs: No Transportation Needs (11/14/2019)   PRAPARE - Hydrologist (Medical): No    Lack of Transportation (Non-Medical): No  Physical Activity: Not on file  Stress: Not on file  Social Connections: Not on file  Intimate Partner Violence: Not on file    Family History  Adopted: Yes  Problem Relation Age of Onset   Diabetes Maternal Aunt     Current Outpatient Medications on File Prior to Visit  Medication Sig Dispense Refill   acetaminophen (TYLENOL) 325 MG tablet Take 325 mg by mouth every 6 (six) hours as needed.  ALPRAZolam (XANAX) 1 MG tablet Take 1 mg by mouth in the morning, at noon, in the evening, and at bedtime.      amitriptyline (ELAVIL) 25 MG tablet Take 1 tablet (25 mg total) by mouth at bedtime. 90 tablet 1   Blood Glucose Monitoring Suppl (ONETOUCH PING METER REMOTE) Supplies MISC 1 each by Does not apply route 4 (four) times daily -  before meals and at bedtime. 1 each 0   Blood Glucose Monitoring Suppl (ONETOUCH VERIO FLEX SYSTEM) w/Device KIT USE TO check blood glucose AS DIRECTED     COMFORT EZ PEN NEEDLES 31G X 5 MM MISC E11.44 use new needle with each new injection 100 each 2   Dulaglutide (TRULICITY) 1.5 TO/6.7TI SOPN Inject 1.5 mg into the skin once a week.     furosemide (LASIX) 20 MG tablet Take 1 tablet (20 mg total) by mouth daily. 90 tablet 1   glucose blood test strip Use as  instructed 100 each 12   insulin degludec (TRESIBA FLEXTOUCH) 100 UNIT/ML FlexTouch Pen Inject 65 Units into the skin daily. (Patient taking differently: Inject 60 Units into the skin daily.) 60 mL 0   Lancets (ONETOUCH ULTRASOFT) lancets Use as instructed 100 each 12   lisinopril (ZESTRIL) 40 MG tablet TAKE ONE TABLET BY MOUTH EVERY MORNING and TAKE ONE TABLET BY MOUTH EVERYDAY AT BEDTIME 180 tablet 1   montelukast (SINGULAIR) 10 MG tablet Take 1 tablet by mouth daily 90 tablet 1   Multiple Vitamins-Minerals (CENTRUM SILVER PO) Take 1 tablet by mouth daily.     NOVOFINE PLUS PEN NEEDLE 32G X 4 MM MISC      pregabalin (LYRICA) 300 MG capsule TAKE ONE CAPSULE BY MOUTH EVERY EVENING 90 capsule 1   rosuvastatin (CRESTOR) 10 MG tablet TAKE ONE TABLET BY MOUTH ONCE DAILY. 90 tablet 1   sertraline (ZOLOFT) 100 MG tablet Take 1 tablet (100 mg total) by mouth 2 (two) times daily. (Patient taking differently: Take 200 mg by mouth daily.) 458 tablet 1   TRULICITY 3 KD/9.8PJ SOPN INJECT 3 MG (0.5 ML) UNDER THE SKIN ONCE A WEEK 8 mL 0   Vibegron (GEMTESA) 75 MG TABS Take 1 tablet by mouth daily. 90 tablet 1   Vitamin D, Cholecalciferol, 25 MCG (1000 UT) CAPS Take 2,000 Int'l Units by mouth.     No current facility-administered medications on file prior to visit.    No Known Allergies     Physical Exam There were no vitals filed for this visit. Estimated body mass index is 41.5 kg/m as calculated from the following:   Height as of 12/28/21: 6' (1.829 m).   Weight as of 09/24/21: 306 lb (138.8 kg).  EKG (optional): deferred due to virtual visit  GENERAL: alert, oriented, no audible sounds of distress  PSYCH/NEURO: pleasant and cooperative, no obvious depression or anxiety, speech and thought processing grossly intact, Cognitive function grossly intact  [facility] Office Visit from 09/24/2021 in [place]  PHQ-9 Total Score 1           04/13/2022    1:30 PM 09/24/2021    3:17 PM  06/15/2021    2:05 PM 03/09/2021    2:08 PM 12/16/2020    3:51 PM  Depression screen PHQ 2/9  Decreased Interest 1 0 0 0 0  Down, Depressed, Hopeless 0 0 0 1 0  PHQ - 2 Score 1 0 0 1 0  Altered sleeping  0 0 0   Tired,  decreased energy  '1 1 3   '$ Change in appetite  0 0 0   Feeling bad or failure about yourself   0 0 1   Trouble concentrating  0 0 0   Moving slowly or fidgety/restless  0 0 0   Suicidal thoughts  0 0 0   PHQ-9 Score  '1 1 5   '$ Difficult doing work/chores  Not difficult at all Not difficult at all    Sees psychiatry on regular basis and reports is doing ok. Health caused big changes in her life and that was hard.      12/02/2020    2:04 PM 12/16/2020    3:50 PM 03/09/2021    2:08 PM 12/28/2021    1:40 PM 04/13/2022    1:18 PM  Fall Risk  Falls in the past year? 0 1 0 1 0  Was there an injury with Fall? 0 0 0 0 0  Fall Risk Category Calculator 0 2 0 1 0  Fall Risk Category Low Moderate Low Low Low  Patient Fall Risk Level  Moderate fall risk  High fall risk Moderate fall risk  Patient at Risk for Falls Due to History of fall(s) History of fall(s);Impaired mobility  Impaired mobility   Fall risk Follow up Falls evaluation completed Falls evaluation completed;Falls prevention discussed;Education provided Falls evaluation completed Falls evaluation completed   Using great caution with transfers and has a lot of help. Not ambulatory.    SUMMARY AND PLAN:  Encounter for Medicare annual wellness exam   Discussed applicable health maintenance/preventive health measures and advised and referred or ordered per patient preferences:  Health Maintenance  Topic Date Due   Hepatitis C Screening  Never done   Zoster Vaccines- Shingrix (1 of 2) Never done   DEXA SCAN  Never done   COVID-19 Vaccine (1) 04/30/2023 (Originally 08/13/1960)   Diabetic kidney evaluation - Urine ACR  06/16/2022   HEMOGLOBIN A1C  06/28/2022   MAMMOGRAM  08/27/2022   FOOT EXAM  09/25/2022   OPHTHALMOLOGY  EXAM  12/05/2022   Diabetic kidney evaluation - eGFR measurement  12/29/2022   Medicare Annual Wellness (AWV)  04/14/2023   Fecal DNA (Cologuard)  12/18/2023   DTaP/Tdap/Td (2 - Td or Tdap) 12/29/2031   Pneumonia Vaccine 65+ Years old  Completed   INFLUENZA VACCINE  Completed   HPV VACCINES  Aged Out  She declines the covid vaccines. She reports she sees her eye doctor every 6 months and reports is utd on the diabetic eye exam. Knows can call about dexa when ready to do as had been ordered. She thinks she had the shinrix vaccine and plans to discuss with PCP.    Education and counseling on the following was provided based on the above review of health and a plan/checklist for the patient, along with additional information discussed, was provided for the patient in the patient instructions :  -Advised on importance of and resources for completing advanced directives, she and husband plan to do soon -Provided counseling and plan for difficulty hearing discussed/referral to audiology or ENT if applicable per above screening. Not currently wanting referral - reports she plans to check with insurance on options. -Provided counseling and plan for increased risk of falling if applicable per above screening. She reports she is very cautious and has lots of help.  -Provided counseling and plan for function difficulties/ difficulties with ADLs if applicable per above screening. -Advised and counseled on maintaining healthy weight and healthy lifestyle -  including the importance of a health diet, regular physical activity, social connections and stress management. -Advised and counseled on a whole foods based healthy diet and regular exercise: discussed a heart healthy whole foods based diet. A summary of a healthy diet was provided in the Patient Instructions.Offered referral to dietician/weight management clinic if applicable and follow up virtual visits to assist further and monitor progress. Recommended  regular exercise and discussed options. -Advised yearly dental visits at minimum and regular eye exams -Advised follow up with PCP as planned later this month and provided her with the date and time to note on her calendar.  Follow up: see patient instructions     Patient Instructions  I really enjoyed getting to talk with you today! I am available on Tuesdays and Thursdays for virtual visits if you have any questions or concerns, or if I can be of any further assistance.   CHECKLIST FROM ANNUAL WELLNESS VISIT:  -Follow up (please call to schedule if not scheduled after visit):  -Inperson visit with your Primary Doctor office:     JANUARY 25th [facility]) @ 1:30 PM with DR. COX  -yearly for annual wellness visit with primary care office  Here is a list of your preventive care/health maintenance measures and the plan for each if any are due:  Health Maintenance  Topic Date Due   Hepatitis C Screening  Never done   Zoster Vaccines- Shingrix (1 of 2) Never done   DEXA SCAN  Never done   COVID-19 Vaccine (1) 04/30/2023 (Originally 08/13/1960)   Diabetic kidney evaluation - Urine ACR  06/16/2022   HEMOGLOBIN A1C  06/28/2022   MAMMOGRAM  08/27/2022   FOOT EXAM  09/25/2022   OPHTHALMOLOGY EXAM  12/05/2022   Diabetic kidney evaluation - eGFR measurement  12/29/2022   Medicare Annual Wellness (AWV)  04/14/2023   Fecal DNA (Cologuard)  12/18/2023   DTaP/Tdap/Td (2 - Td or Tdap) 12/29/2031   Pneumonia Vaccine 20+ Years old  Completed   INFLUENZA VACCINE  Completed   HPV VACCINES  Aged Out    -See a dentist at least yearly  -Get your eyes checked and then per your eye specialist's recommendations  -Other issues addressed today: -  -I have included below further information regarding a healthy whole foods based diet, physical activity guidelines for adults, stress management and opportunities for social connections. I hope you find this information useful.      NUTRITION: -eat real food: lots of colorful vegetables (half the plate) and fruits -5-7 servings of vegetables and fruits per day (fresh or steamed is best), exp. 2 servings of vegetables with lunch and dinner and 2 servings of fruit per day. Berries and greens such as kale and collards are great choices.  -consume on a regular basis: whole grains (make sure first ingredient on label contains the word "whole"), fresh fruits, fish, nuts, seeds, healthy oils (such as olive oil, avocado oil, grape seed oil) -may eat small amounts of dairy and lean meat on occasion, but avoid processed meats such as ham, bacon, lunch meat, etc. -drink water -try to avoid fast food and pre-packaged foods, processed meat -most experts advise limiting sodium to < '2300mg'$  per day, should limit further is any chronic conditions such as high blood pressure, heart disease, diabetes, etc. The American Heart Association advised that < '1500mg'$  is is ideal -try to avoid foods that contain any ingredients with names you do not recognize  -try to avoid sugar/sweets (except for the natural sugar  that occurs in fresh fruit) -try to avoid sweet drinks -try to avoid white rice, white bread, pasta (unless whole grain), white or yellow potatoes  EXERCISE GUIDELINES FOR ADULTS: -if you wish to increase your physical activity, do so gradually and with the approval of your doctor -STOP and seek medical care immediately if you have any chest pain, chest discomfort or trouble breathing when starting or increasing exercise  -move and stretch your body, legs, feet and arms when sitting for long periods -Physical activity guidelines for optimal health in adults: -least 150 minutes per week of  aerobic exercise (can talk, but not sing) once approved by your doctor, 20-30 minutes of sustained activity  -consider chair exercises (exercises you can do in your chair) or exercises you learned from PT that you can do despite your challenges with movement  STRESS MANAGEMENT: -can try meditating, or just sitting quietly with deep breathing while intentionally relaxing all parts of your body for 5 minutes daily -if you need further help with stress, anxiety or depression please follow up with your primary doctor or contact the wonderful folks at [facility]: [place]:  Everyone should have advanced health care directives in place. This is so that you get the care you want, should you ever be in a situation where you are unable to make your own medical decisions.   From the [place] Advanced Directive Website: "[place] are legal documents in which you give written instructions about your health care if, in the future, you cannot speak for yourself.   A health care power of attorney allows you to name a person you trust to make your health care decisions if you cannot make them yourself. A declaration of a desire for a natural death (or living will) is document, which states that you desire not to have your life prolonged by extraordinary measures if you have a terminal or incurable illness or if you are in a vegetative state. An advance instruction for mental health treatment makes a declaration of instructions, information and preferences regarding your mental health treatment. It also states that you are aware that the advance instruction authorizes a mental health treatment provider to act according to your wishes. It may also outline your consent or refusal of mental health treatment. A declaration of an anatomical gift allows anyone over the age of 48 to make a gift by will, organ donor card or other document."   Please see  the following website or an elder law attorney for forms, FAQs and for completion of advanced directives: [place] Secretary of [place] ([email or web address])  Or copy and paste the following to your web browser: [email or web address]           Lucretia Kern, DO

## 2022-04-13 NOTE — Patient Instructions (Addendum)
I really enjoyed getting to talk with you today! I am available on Tuesdays and Thursdays for virtual visits if you have any questions or concerns, or if I can be of any further assistance.   CHECKLIST FROM ANNUAL WELLNESS VISIT:  -Follow up (please call to schedule if not scheduled after visit):  -Inperson visit with your Primary Doctor office:     JANUARY 25th [facility]) @ 1:30 PM with DR. COX  -yearly for annual wellness visit with primary care office  Here is a list of your preventive care/health maintenance measures and the plan for each if any are due:  Health Maintenance  Topic Date Due   Hepatitis C Screening  Never done   Zoster Vaccines- Shingrix (1 of 2) Never done   DEXA SCAN  Never done   COVID-19 Vaccine (1) 04/30/2023 (Originally 08/13/1960)   Diabetic kidney evaluation - Urine ACR  06/16/2022   HEMOGLOBIN A1C  06/28/2022   MAMMOGRAM  08/27/2022   FOOT EXAM  09/25/2022   OPHTHALMOLOGY EXAM  12/05/2022   Diabetic kidney evaluation - eGFR measurement  12/29/2022   Medicare Annual Wellness (AWV)  04/14/2023   Fecal DNA (Cologuard)  12/18/2023   DTaP/Tdap/Td (2 - Td or Tdap) 12/29/2031   Pneumonia Vaccine 50+ Years old  Completed   INFLUENZA VACCINE  Completed   HPV VACCINES  Aged Out    -See a dentist at least yearly  -Get your eyes checked and then per your eye specialist's recommendations  -Other issues addressed today: -  -I have included below further information regarding a healthy whole foods based diet, physical activity guidelines for adults, stress management and opportunities for social connections. I hope you find this information useful.     NUTRITION: -eat real food: lots of colorful vegetables (half the  plate) and fruits -5-7 servings of vegetables and fruits per day (fresh or steamed is best), exp. 2 servings of vegetables with lunch and dinner and 2 servings of fruit per day. Berries and greens such as kale and collards are great choices.  -consume on a regular basis: whole grains (make sure first ingredient on label contains the word "whole"), fresh fruits, fish, nuts, seeds, healthy oils (such as olive oil, avocado oil, grape seed oil) -may eat small amounts of dairy and lean meat on occasion, but avoid processed meats such as ham, bacon, lunch meat, etc. -drink water -try to avoid fast food and pre-packaged foods, processed meat -most experts advise limiting sodium to < '2300mg'$  per day, should limit further is any chronic conditions such as high blood pressure, heart disease, diabetes, etc. The American Heart Association advised that < '1500mg'$  is is ideal -try to avoid foods that contain any ingredients with names you do not recognize  -try to avoid sugar/sweets (except for the natural sugar that occurs in fresh fruit) -try to avoid sweet drinks -try to avoid white rice, white bread, pasta (unless whole grain), white or yellow potatoes  EXERCISE GUIDELINES FOR ADULTS: -if you wish to increase your physical activity, do so gradually and with the approval of your doctor -STOP and seek medical care immediately if you have any chest pain, chest discomfort or trouble breathing when starting or increasing exercise  -move and stretch your body, legs, feet and arms when sitting for long periods -Physical activity guidelines for optimal health in adults: -least 150 minutes per week of aerobic exercise (can talk, but not sing) once approved by your doctor, 20-30 minutes of sustained activity  -consider chair  exercises (exercises you can do in your chair) or exercises you learned from PT that you can do despite your challenges with movement  STRESS MANAGEMENT: -can try meditating, or just sitting  quietly with deep breathing while intentionally relaxing all parts of your body for 5 minutes daily -if you need further help with stress, anxiety or depression please follow up with your primary doctor or contact the wonderful folks at [place]: [place]:  Everyone should have advanced health care directives in place. This is so that you get the care you want, should you ever be in a situation where you are unable to make your own medical decisions.   From the [place] Advanced Directive Website: "[place] are legal documents in which you give written instructions about your health care if, in the future, you cannot speak for yourself.   A health care power of attorney allows you to name a person you trust to make your health care decisions if you cannot make them yourself. A declaration of a desire for a natural death (or living will) is document, which states that you desire not to have your life prolonged by extraordinary measures if you have a terminal or incurable illness or if you are in a vegetative state. An advance instruction for mental health treatment makes a declaration of instructions, information and preferences regarding your mental health treatment. It also states that you are aware that the advance instruction authorizes a mental health treatment provider to act according to your wishes. It may also outline your consent or refusal of mental health treatment. A declaration of an anatomical gift allows anyone over the age of 71 to make a gift by will, organ donor card or other document."   Please see the following website or an elder law attorney for forms, FAQs and for completion of advanced directives: [place] [employer] [place] ([email or web address])  Or copy and paste the following to your web browser: [email or web address]

## 2022-04-15 ENCOUNTER — Telehealth: Payer: Self-pay

## 2022-04-15 NOTE — Progress Notes (Signed)
Care Management & Coordination Services Pharmacy Team  Reason for Encounter: Diabetes    Recent office visits:  None  Recent consult visits:  04-13-2022 Lucretia Kern, DO (Family medicine). AWV completed  Hospital visits:  Medication Reconciliation was completed by comparing discharge summary, patient's EMR and Pharmacy list, and upon discussion with patient.   Admitted to the hospital on 02/07/22 due to Emesis. Discharge date was 02/10/22. Discharged from [facility]?Medications Started at [facility] Discharge:?? -started lactulose 20 gram/30 mL solution  -omeprazole 20 MG DR capsule    Medication Changes at Hospital Discharge: -Changed ALPRAZolam 1 MG tablet What changed: when to take this Dose: 1 mg Instructions: Take 1 tablet (1 mg total) by mouth 3 (three) times daily as needed.  ?  -All other medications will remain the same.   Medications: Outpatient Encounter Medications as of 04/15/2022  Medication Sig   acetaminophen (TYLENOL) 325 MG tablet Take 325 mg by mouth every 6 (six) hours as needed.   ALPRAZolam (XANAX) 1 MG tablet Take 1 mg by mouth in the morning, at noon, in the evening, and at bedtime.    amitriptyline (ELAVIL) 25 MG tablet Take 1 tablet (25 mg total) by mouth at bedtime.   Blood Glucose Monitoring Suppl (ONETOUCH PING METER REMOTE) Supplies MISC 1 each by Does not apply route 4 (four) times daily -  before meals and at bedtime.   Blood Glucose Monitoring Suppl (ONETOUCH VERIO FLEX SYSTEM) w/Device KIT USE TO check blood glucose AS DIRECTED   COMFORT EZ PEN NEEDLES 31G X 5 MM MISC E11.44 use new needle with each new injection   Dulaglutide (TRULICITY) 1.5 EL/3.8BO SOPN Inject 1.5 mg into the skin once a week.   furosemide (LASIX) 20 MG tablet Take 1 tablet (20 mg total) by mouth daily.   glucose blood test strip Use as instructed   insulin degludec (TRESIBA FLEXTOUCH) 100 UNIT/ML FlexTouch Pen Inject 65 Units into the skin daily. (Patient  taking differently: Inject 60 Units into the skin daily.)   Lancets (ONETOUCH ULTRASOFT) lancets Use as instructed   lisinopril (ZESTRIL) 40 MG tablet TAKE ONE TABLET BY MOUTH EVERY MORNING and TAKE ONE TABLET BY MOUTH EVERYDAY AT BEDTIME   montelukast (SINGULAIR) 10 MG tablet Take 1 tablet by mouth daily   Multiple Vitamins-Minerals (CENTRUM SILVER PO) Take 1 tablet by mouth daily.   NOVOFINE PLUS PEN NEEDLE 32G X 4 MM MISC    pregabalin (LYRICA) 300 MG capsule TAKE ONE CAPSULE BY MOUTH EVERY EVENING   rosuvastatin (CRESTOR) 10 MG tablet TAKE ONE TABLET BY MOUTH ONCE DAILY.   sertraline (ZOLOFT) 100 MG tablet Take 1 tablet (100 mg total) by mouth 2 (two) times daily. (Patient taking differently: Take 200 mg by mouth daily.)   TRULICITY 3 FB/5.1WC SOPN INJECT 3 MG (0.5 ML) UNDER THE SKIN ONCE A WEEK   Vibegron (GEMTESA) 75 MG TABS Take 1 tablet by mouth daily.   Vitamin D, Cholecalciferol, 25 MCG (1000 UT) CAPS Take 2,000 Int'l Units by mouth.   No facility-administered encounter medications on file as of 04/15/2022.    Recent Relevant Labs: Lab Results  Component Value Date/Time   HGBA1C 6.5 (H) 12/28/2021 03:01 PM   HGBA1C 6.8 (H) 09/24/2021 04:50 PM   HGBA1C 7.5 02/12/2019 12:00 AM   MICROALBUR 10 12/02/2020 03:31 PM    Kidney Function Lab Results  Component Value Date/Time   CREATININE 0.82 12/28/2021 03:01 PM   CREATININE 0.85 09/24/2021  04:50 PM   GFRNONAA 60 05/12/2020 02:40 PM   GFRAA 69 05/12/2020 02:40 PM    04-15-2022: 1st attempt left VM 04-16-2022: 2nd attempt left VM 04-20-2022: 3rd attempt left VM  Adherence Review: Is the patient currently on a STATIN medication? Yes Is the patient currently on ACE/ARB medication? Yes Does the patient have >5 day gap between last estimated fill dates? No  Star Rating Drugs:  Trulicity- PAP  Rosuvastatin 10 mg- Last filled 03-11-2022 90 DS. Previous 12-10-2021 90 DS. Lisinopril 40 mg- Last filled 03-11-2022 90 DS. Previous  12-10-2021 90 DS.    Christy Crawford

## 2022-04-29 ENCOUNTER — Ambulatory Visit: Payer: Medicare Other | Admitting: Family Medicine

## 2022-05-13 DIAGNOSIS — I152 Hypertension secondary to endocrine disorders: Secondary | ICD-10-CM | POA: Diagnosis not present

## 2022-05-13 DIAGNOSIS — E1159 Type 2 diabetes mellitus with other circulatory complications: Secondary | ICD-10-CM | POA: Diagnosis not present

## 2022-05-13 DIAGNOSIS — E782 Mixed hyperlipidemia: Secondary | ICD-10-CM | POA: Diagnosis not present

## 2022-05-13 DIAGNOSIS — E1144 Type 2 diabetes mellitus with diabetic amyotrophy: Secondary | ICD-10-CM | POA: Diagnosis not present

## 2022-05-13 DIAGNOSIS — K439 Ventral hernia without obstruction or gangrene: Secondary | ICD-10-CM | POA: Diagnosis not present

## 2022-05-18 ENCOUNTER — Ambulatory Visit (INDEPENDENT_AMBULATORY_CARE_PROVIDER_SITE_OTHER): Payer: Medicare Other | Admitting: Family Medicine

## 2022-05-18 ENCOUNTER — Encounter: Payer: Self-pay | Admitting: Family Medicine

## 2022-05-18 VITALS — BP 142/82 | HR 92 | Temp 97.5°F | Ht 72.0 in

## 2022-05-18 DIAGNOSIS — M170 Bilateral primary osteoarthritis of knee: Secondary | ICD-10-CM | POA: Diagnosis not present

## 2022-05-18 DIAGNOSIS — E1144 Type 2 diabetes mellitus with diabetic amyotrophy: Secondary | ICD-10-CM | POA: Diagnosis not present

## 2022-05-18 DIAGNOSIS — L659 Nonscarring hair loss, unspecified: Secondary | ICD-10-CM

## 2022-05-18 DIAGNOSIS — E782 Mixed hyperlipidemia: Secondary | ICD-10-CM | POA: Diagnosis not present

## 2022-05-18 DIAGNOSIS — E1159 Type 2 diabetes mellitus with other circulatory complications: Secondary | ICD-10-CM

## 2022-05-18 DIAGNOSIS — K439 Ventral hernia without obstruction or gangrene: Secondary | ICD-10-CM | POA: Diagnosis not present

## 2022-05-18 DIAGNOSIS — M797 Fibromyalgia: Secondary | ICD-10-CM

## 2022-05-18 DIAGNOSIS — Z6841 Body Mass Index (BMI) 40.0 and over, adult: Secondary | ICD-10-CM

## 2022-05-18 DIAGNOSIS — M25511 Pain in right shoulder: Secondary | ICD-10-CM

## 2022-05-18 DIAGNOSIS — I152 Hypertension secondary to endocrine disorders: Secondary | ICD-10-CM | POA: Diagnosis not present

## 2022-05-18 DIAGNOSIS — M25512 Pain in left shoulder: Secondary | ICD-10-CM

## 2022-05-18 DIAGNOSIS — G8929 Other chronic pain: Secondary | ICD-10-CM

## 2022-05-18 DIAGNOSIS — F330 Major depressive disorder, recurrent, mild: Secondary | ICD-10-CM

## 2022-05-18 MED ORDER — TRIAMCINOLONE ACETONIDE 40 MG/ML IJ SUSP
40.0000 mg | Freq: Once | INTRAMUSCULAR | Status: AC
  Administered 2022-05-18: 40 mg via INTRA_ARTICULAR

## 2022-05-18 NOTE — Assessment & Plan Note (Signed)
Continue to follow up with Dr. Tanna Savoy surgeon.

## 2022-05-18 NOTE — Assessment & Plan Note (Addendum)
Well controlled.  No changes to medicines. Lisinopril 40 mg twice daily.  Continue to work on eating a healthy diet and exercise.  Labs drawn today.

## 2022-05-18 NOTE — Assessment & Plan Note (Signed)
Ordered Left shoulder Xray.

## 2022-05-18 NOTE — Assessment & Plan Note (Signed)
Control: good Recommend check sugars fasting daily. Recommend check feet daily. Recommend annual eye exams. Medicines: Continue Trulicity 3 mg once weekly. Also taking Tresiba 65 units qhs, Lyrica 300 mg one tablet twice daily. Continue to work on eating a healthy diet and exercise.  Labs drawn today.

## 2022-05-18 NOTE — Assessment & Plan Note (Signed)
The current medical regimen is effective;  continue present plan and medications. Continue Zoloft 100 mg twice daily and xanax 1 mg oral four times a day. Sees Dr Marquis Lunch every 6 months.

## 2022-05-18 NOTE — Assessment & Plan Note (Addendum)
Risks were discussed including bleeding, infection, increase in sugars if diabetic, atrophy at site of injection, and increased pain.  After consent was obtained, using sterile technique the right shoulder was prepped with alcohol.  The joint was entered posteriorly and  Kenalog 40 mg and 5 ml plain Lidocaine was then injected and the needle withdrawn.  The procedure was well tolerated.   The patient is asked to continue to rest the joint for a few more days before resuming regular activities.  It may be more painful for the first 1-2 days.  Watch for fever, or increased swelling or persistent pain in the joint. Call or return to clinic prn if such symptoms occur or there is failure to improve as anticipated.  Ordered right shoulder Xray

## 2022-05-18 NOTE — Assessment & Plan Note (Addendum)
Well controlled.  No changes to medicines. Rosuvastatin 10 mg take 1 tablet daily.  Continue to work on eating a healthy diet and exercise.  Labs drawn today.

## 2022-05-18 NOTE — Progress Notes (Unsigned)
Subjective:  Patient ID: Christy Crawford, female    DOB: [birth year]  Age: 67 y.o. MRN: [id number]  Chief Complaint  Patient presents with   Diabetes   Hyperlipidemia    Diabetes:  Complications: Diabetic macular edema. Amyotropy/neuropathy. Glucose checking: Patient checks sugars three times daily. Glucose logs: lowest 70s and highest 250's Hypoglycemia: a few readings in the 70's, but patient does not have symptoms or feel like its low.  Most recent A1C: 6.5 Current medications: Patient is  still currently taking Trulicity 3 mg once weekly. Also taking Tresiba 60 units qhs, Lyrica 300 mg one tablet twice daily. Last Eye Exam. 01/27/2021.Marland Kitchen Seeing every 6 months. Foot checks: Patient checks her feet daily.  Hyperlipidemia: Current medications: Patient is currently taking Rosuvastatin 10 mg take 1 tablet daily.  Hypertension: Current medications: Patient is currently taking Lisinopril 40 mg twice daily.  Depression: Patient is currently taking Zoloft 100 mg twice daily. On xanax 1 mg oral four times a day. Sees Dr Marquis Lunch every 6 months.   Urge incontinence: on gemtesa 75 mg daily. Still having leakage.   Diet: healthy Exercise: Patient is unable to exercise due to Amyotrophy/Neuropathy.        05/18/2022    1:38 PM 04/13/2022    1:30 PM 09/24/2021    3:17 PM 06/15/2021    2:05 PM 03/09/2021    2:08 PM  Depression screen PHQ 2/9  Decreased Interest 0 1 0 0 0  Down, Depressed, Hopeless 0 0 0 0 1  PHQ - 2 Score 0 1 0 0 1  Altered sleeping 1  0 0 0  Tired, decreased energy 3  1 1 3  $ Change in appetite 1  0 0 0  Feeling bad or failure about yourself  1  0 0 1  Trouble concentrating 0  0 0 0  Moving slowly or fidgety/restless 1  0 0 0  Suicidal thoughts 0  0 0 0  PHQ-9 Score 7  1 1 5  $ Difficult doing work/chores Somewhat difficult  Not difficult at all Not difficult at all          12/16/2020    3:50 PM 03/09/2021    2:08 PM 12/28/2021    1:40 PM 04/13/2022    1:18 PM  05/18/2022    1:38 PM  Fall Risk  Falls in the past year? 1 0 1 0 1  Was there an injury with Fall? 0 0 0 0 0  Fall Risk Category Calculator 2 0 1 0 2  Fall Risk Category (Retired) Moderate Low Low Low   (RETIRED) Patient Fall Risk Level Moderate fall risk  High fall risk Moderate fall risk   Patient at Risk for Falls Due to History of fall(s);Impaired mobility  Impaired mobility  Impaired balance/gait  Fall risk Follow up Falls evaluation completed;Falls prevention discussed;Education provided Falls evaluation completed Falls evaluation completed  Falls evaluation completed;Education provided      Review of Systems  Constitutional:  Positive for appetite change (No appetite.). Negative for fatigue and fever.  HENT:  Negative for congestion, ear pain, sinus pressure and sore throat.   Respiratory:  Negative for cough, chest tightness, shortness of breath and wheezing.   Cardiovascular:  Negative for chest pain and palpitations.  Gastrointestinal:  Positive for abdominal pain (Mid abdominal pain due to Hernia.) and nausea. Negative for constipation, diarrhea and vomiting.  Genitourinary:  Negative for dysuria and hematuria.  Musculoskeletal:  Positive for myalgias (Bilateral Shoulder pain). Negative  for arthralgias, back pain and joint swelling.  Skin:  Negative for rash.  Neurological:  Negative for dizziness, weakness and headaches.  Psychiatric/Behavioral:  Negative for dysphoric mood. The patient is not nervous/anxious.     Current Outpatient Medications on File Prior to Visit  Medication Sig Dispense Refill   acetaminophen (TYLENOL) 325 MG tablet Take 325 mg by mouth every 6 (six) hours as needed.     ALPRAZolam (XANAX) 1 MG tablet Take 1 mg by mouth in the morning, at noon, in the evening, and at bedtime.      amitriptyline (ELAVIL) 25 MG tablet Take 1 tablet (25 mg total) by mouth at bedtime. 90 tablet 1   Blood Glucose Monitoring Suppl (ONETOUCH PING METER REMOTE) Supplies MISC 1  each by Does not apply route 4 (four) times daily -  before meals and at bedtime. 1 each 0   Blood Glucose Monitoring Suppl (ONETOUCH VERIO FLEX SYSTEM) w/Device KIT USE TO check blood glucose AS DIRECTED     COMFORT EZ PEN NEEDLES 31G X 5 MM MISC E11.44 use new needle with each new injection 100 each 2   furosemide (LASIX) 20 MG tablet Take 1 tablet (20 mg total) by mouth daily. (Patient taking differently: Take 20 mg by mouth 3 (three) times a week.) 90 tablet 1   glucose blood test strip Use as instructed 100 each 12   insulin degludec (TRESIBA FLEXTOUCH) 100 UNIT/ML FlexTouch Pen Inject 65 Units into the skin daily. (Patient taking differently: Inject 60 Units into the skin daily.) 60 mL 0   Lancets (ONETOUCH ULTRASOFT) lancets Use as instructed 100 each 12   lisinopril (ZESTRIL) 40 MG tablet TAKE ONE TABLET BY MOUTH EVERY MORNING and TAKE ONE TABLET BY MOUTH EVERYDAY AT BEDTIME 180 tablet 1   montelukast (SINGULAIR) 10 MG tablet Take 1 tablet by mouth daily 90 tablet 1   Multiple Vitamins-Minerals (CENTRUM SILVER PO) Take 1 tablet by mouth daily.     NOVOFINE PLUS PEN NEEDLE 32G X 4 MM MISC      pregabalin (LYRICA) 300 MG capsule TAKE ONE CAPSULE BY MOUTH EVERY EVENING 90 capsule 1   rosuvastatin (CRESTOR) 10 MG tablet TAKE ONE TABLET BY MOUTH ONCE DAILY. 90 tablet 1   sertraline (ZOLOFT) 100 MG tablet Take 1 tablet (100 mg total) by mouth 2 (two) times daily. (Patient taking differently: Take 200 mg by mouth daily.) 99991111 tablet 1   TRULICITY 3 0000000 SOPN INJECT 3 MG (0.5 ML) UNDER THE SKIN ONCE A WEEK 8 mL 0   Vibegron (GEMTESA) 75 MG TABS Take 1 tablet by mouth daily. 90 tablet 1   Vitamin D, Cholecalciferol, 25 MCG (1000 UT) CAPS Take 2,000 Int'l Units by mouth.     No current facility-administered medications on file prior to visit.   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 ([place])    Essential hypertension    Fibromyalgia    Generalized edema    Hyperlipidemia    Hypertension    Major depression    Migraine headache    Mixed hyperlipidemia    Sleep apnea    Telogen effluvium    Past Surgical History:  Procedure Laterality Date   CHOLECYSTECTOMY     lens implants Bilateral     Family History  Adopted: Yes  Problem  Relation Age of Onset   Diabetes Maternal Aunt    Social History   Socioeconomic History   Marital status: Married    Spouse name: Not on file   Number of children: 1   Years of education: Not on file   Highest education level: Not on file  Occupational History   Occupation: disabled  Tobacco Use   Smoking status: Former    Types: Cigarettes    Quit date: 2000    Years since quitting: 24.1   Smokeless tobacco: Never  Vaping Use   Vaping Use: Never used  Substance and Sexual Activity   Alcohol use: Yes    Alcohol/week: 1.0 standard drink of alcohol    Types: 1 Glasses of wine per week    Comment: socially   Drug use: Never   Sexual activity: Not on file  Other Topics Concern   Not on file  Social History Narrative   Not on file   Social Determinants of Health   Financial Resource Strain: Not on file  Food Insecurity: No Food Insecurity (11/14/2019)   Hunger Vital Sign    Worried About Running Out of Food in the Last Year: Never true    Ran Out of Food in the Last Year: Never true  Transportation Needs: No Transportation Needs (11/14/2019)   PRAPARE - Hydrologist (Medical): No    Lack of Transportation (Non-Medical): No  Physical Activity: Not on file  Stress: Not on file  Social Connections: Not on file    Objective:  BP (!) 142/82 (BP Location: Left Arm, Patient Position: Sitting)   Pulse 92   Temp (!) 97.5 F (36.4 C) (Temporal)   Ht 6' (1.829 m)   SpO2 95%   BMI 41.50 kg/m      05/18/2022    2:21  PM 05/18/2022    1:37 PM 12/28/2021    1:36 PM  BP/Weight  Systolic BP [id number]  Diastolic BP 82 72 72    Physical Exam Vitals reviewed.  Constitutional:      Appearance: Normal appearance. She is normal weight.  Cardiovascular:     Rate and Rhythm: Normal rate and regular rhythm.     Heart sounds: Normal heart sounds.  Pulmonary:     Effort: Pulmonary effort is normal.     Breath sounds: Normal breath sounds.  Abdominal:     General: Abdomen is flat. Bowel sounds are normal.     Palpations: Abdomen is soft.  Neurological:     Mental Status: She is alert and oriented to person, place, and time.  Psychiatric:        Mood and Affect: Mood normal.        Behavior: Behavior normal.     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 Recommend check feet daily. Recommend annual eye exams. Medicines: Continue Trulicity 3 mg once weekly. Also taking Tresiba 65 units qhs, Lyrica 300 mg one tablet twice daily. Continue to work on eating a healthy diet and exercise.  Labs drawn today.    Orders: -     Hemoglobin A1c  Hypertension associated with diabetes ([place]) Assessment & Plan: Well controlled.  No changes to medicines.  Continue to work on eating a healthy diet and exercise.  Labs drawn today.  Orders: -      CBC With Diff/Platelet -     Comprehensive metabolic panel  Mixed hyperlipidemia Assessment & Plan: Well controlled.  No changes to medicines.  Continue to work on eating a healthy diet and exercise.  Labs drawn today.  Orders: -     Lipid panel -     TSH  Mild recurrent major depression ([place]) Assessment & Plan: The current medical regimen is effective;  continue present plan and medications. Continue Zoloft 100 mg twice daily and xanax 1 mg oral four times a day. Sees Dr Marquis Lunch every 6 months.    Fibromyalgia  Ventral hernia without obstruction or gangrene Assessment & Plan: Continue to follow up with Dr. Tanna Savoy surgeon.   Class 3 severe obesity due to excess calories with serious comorbidity and body mass index (BMI) of 40.0 to 44.9 in adult [facility])  Chronic left shoulder pain Assessment & Plan: Ordered Left shoulder Xray.  Orders: -     DG Shoulder Left; Future  Chronic right shoulder pain Assessment & Plan: A steroid injection was performed at right shoulder using 1% plain Lidocaine 5 ml and 40 mg of Kenalog. This was well tolerated.  Ordered right shoulder Xray  Orders: -     DG Shoulder Right; Future     No orders of the defined types were placed in this encounter.   Orders Placed This Encounter  Procedures   DG Shoulder Left   DG Shoulder Right   CBC With Diff/Platelet   Comprehensive metabolic panel   Lipid panel   Hemoglobin A1c   TSH     Follow-up: No follow-ups on file.   I,Marla I Leal-Borjas,acting as a scribe for [name], MD.,have documented all relevant documentation on the behalf of [name], MD,as directed by  [name], MD while in the presence of [name], MD.   Alvina Filbert Peterson [place] as a scribe for [name], MD.,have documented all relevant documentation on the behalf of [name], MD,as directed by  [name], MD while in the presence of [name], MD.   An After Visit Summary was printed and  given to the patient.  [name], MD [facility] [phone]

## 2022-05-19 LAB — CBC WITH DIFF/PLATELET
Basophils Absolute: 0.1 10*3/uL (ref 0.0–0.2)
Basos: 1 %
EOS (ABSOLUTE): 0.3 10*3/uL (ref 0.0–0.4)
Eos: 4 %
Hematocrit: 43.8 % (ref 34.0–46.6)
Hemoglobin: 14 g/dL (ref 11.1–15.9)
Immature Grans (Abs): 0.1 10*3/uL (ref 0.0–0.1)
Immature Granulocytes: 1 %
Lymphocytes Absolute: 1.4 10*3/uL (ref 0.7–3.1)
Lymphs: 21 %
MCH: 28.4 pg (ref 26.6–33.0)
MCHC: 32 g/dL (ref 31.5–35.7)
MCV: 89 fL (ref 79–97)
Monocytes Absolute: 0.5 10*3/uL (ref 0.1–0.9)
Monocytes: 7 %
Neutrophils Absolute: 4.2 10*3/uL (ref 1.4–7.0)
Neutrophils: 66 %
Platelets: 191 10*3/uL (ref 150–450)
RBC: 4.93 x10E6/uL (ref 3.77–5.28)
RDW: 13.5 % (ref 11.7–15.4)
WBC: 6.4 10*3/uL (ref 3.4–10.8)

## 2022-05-19 LAB — COMPREHENSIVE METABOLIC PANEL
ALT: 19 IU/L (ref 0–32)
AST: 32 IU/L (ref 0–40)
Albumin/Globulin Ratio: 2.3 — ABNORMAL HIGH (ref 1.2–2.2)
Albumin: 4.5 g/dL (ref 3.9–4.9)
Alkaline Phosphatase: 115 IU/L (ref 44–121)
BUN/Creatinine Ratio: 18 (ref 12–28)
BUN: 14 mg/dL (ref 8–27)
Bilirubin Total: 0.2 mg/dL (ref 0.0–1.2)
CO2: 26 mmol/L (ref 20–29)
Calcium: 9.4 mg/dL (ref 8.7–10.3)
Chloride: 105 mmol/L (ref 96–106)
Creatinine, Ser: 0.76 mg/dL (ref 0.57–1.00)
Globulin, Total: 2 g/dL (ref 1.5–4.5)
Glucose: 62 mg/dL — ABNORMAL LOW (ref 70–99)
Potassium: 4.5 mmol/L (ref 3.5–5.2)
Sodium: 146 mmol/L — ABNORMAL HIGH (ref 134–144)
Total Protein: 6.5 g/dL (ref 6.0–8.5)
eGFR: 86 mL/min/{1.73_m2} (ref >59)

## 2022-05-19 LAB — LIPID PANEL
Chol/HDL Ratio: 2.3 ratio (ref 0.0–4.4)
Cholesterol, Total: 144 mg/dL (ref 100–199)
HDL: 64 mg/dL (ref >39)
LDL Chol Calc (NIH): 60 mg/dL (ref 0–99)
Triglycerides: 112 mg/dL (ref 0–149)
VLDL Cholesterol Cal: 20 mg/dL (ref 5–40)

## 2022-05-19 LAB — HEMOGLOBIN A1C
Est. average glucose Bld gHb Est-mCnc: 134 mg/dL
Hgb A1c MFr Bld: 6.3 % — ABNORMAL HIGH (ref 4.8–5.6)

## 2022-05-19 LAB — CARDIOVASCULAR RISK ASSESSMENT

## 2022-05-19 LAB — TSH: TSH: 1.78 u[IU]/mL (ref 0.450–4.500)

## 2022-05-21 NOTE — Assessment & Plan Note (Signed)
Recommend continue to work on eating healthy diet and exercise.  

## 2022-05-21 NOTE — Assessment & Plan Note (Signed)
Continue amitriptyline 25 mg before bed.

## 2022-05-25 ENCOUNTER — Telehealth: Payer: Self-pay

## 2022-05-25 NOTE — Progress Notes (Unsigned)
Called patient to discuss Adherence packaging service with [facility], no answer, [facility].  05/28/2022- Tried patient again, no answer, [facility].

## 2022-06-02 ENCOUNTER — Telehealth: Payer: Self-pay

## 2022-06-02 NOTE — Progress Notes (Unsigned)
Care Management & Coordination Services Pharmacy Team  Reason for Encounter: Medication coordination and delivery  Contacted patient to discuss medications and coordinate delivery from [facility].  {US HC Outreach:28874}  Cycle dispensing form sent to *** for review.   Last adherence delivery date:03/16/22      Patient is due for next adherence delivery on: 06/14/22  This delivery to include: Vials  90 Days  Gemtesa 75 mg 1 tab once daily Lisinopril 40 mg 1 tab at breakfast and bedtime  Montelukast 10 mg 1 tab at bedtime Rosuvastatin 10 mg 1 tab once daily Sertraline 100 mg 2 tabs once daily Pregabalin 300 mg 1 capsule every evening Amitriptyline 25 mg 1 tab at bedtime Xanax 1 mg 1 tab four times daily prn  onetouch verio test strips                     onetouch delica lancets        Patient declined the following medications this month: [place] Flextouch Gets through PAP Trulicity '3mg'$ - PAP. Furosemide '20mg'$ - Pt d/c this due to incontinence issues. I will message Dr. Tobie Poet to inform her.  Refills requested from providers include: Pregabalin 300 mg  Amitriptyline 25 mg  Rosuvastatin 10 mg  Montelukast 10 mg   {Delivery date:25786}   Any concerns about your medications? {yes/no:20286}  How often do you forget or accidentally miss a dose? {Missed doses:25554}  Do you use a pillbox? {yes/no:20286}  Is patient in packaging {yes/no:20286}  If yes  What is the date on your next pill pack?  Any concerns or issues with your packaging?   Recent blood pressure readings are as follows:***  Recent blood glucose readings are as follows:***   Chart review: Recent office visits:  05/18/22 Cox,Kirsten MD. Seen for routine visit. Referral to home health. Changed Dulaglutide to '3mg'$ /0.50m.   Recent consult visits:  05/13/22 (Surgical Specialist) [place] CurryMD. Seen for ED follow up. No med changes.   Hospital visits:  None  Medications: Outpatient Encounter  Medications as of 06/02/2022  Medication Sig   acetaminophen (TYLENOL) 325 MG tablet Take 325 mg by mouth every 6 (six) hours as needed.   ALPRAZolam (XANAX) 1 MG tablet Take 1 mg by mouth in the morning, at noon, in the evening, and at bedtime.    amitriptyline (ELAVIL) 25 MG tablet Take 1 tablet (25 mg total) by mouth at bedtime.   Blood Glucose Monitoring Suppl (ONETOUCH PING METER REMOTE) Supplies MISC 1 each by Does not apply route 4 (four) times daily -  before meals and at bedtime.   Blood Glucose Monitoring Suppl (ONETOUCH VERIO FLEX SYSTEM) w/Device KIT USE TO check blood glucose AS DIRECTED   COMFORT EZ PEN NEEDLES 31G X 5 MM MISC E11.44 use new needle with each new injection   furosemide (LASIX) 20 MG tablet Take 1 tablet (20 mg total) by mouth daily. (Patient taking differently: Take 20 mg by mouth 3 (three) times a week.)   glucose blood test strip Use as instructed   insulin degludec (TRESIBA FLEXTOUCH) 100 UNIT/ML FlexTouch Pen Inject 65 Units into the skin daily. (Patient taking differently: Inject 60 Units into the skin daily.)   Lancets (ONETOUCH ULTRASOFT) lancets Use as instructed   lisinopril (ZESTRIL) 40 MG tablet TAKE ONE TABLET BY MOUTH EVERY MORNING and TAKE ONE TABLET BY MOUTH EVERYDAY AT BEDTIME   montelukast (SINGULAIR) 10 MG tablet Take 1 tablet by mouth daily   Multiple Vitamins-Minerals (CENTRUM SILVER  PO) Take 1 tablet by mouth daily.   NOVOFINE PLUS PEN NEEDLE 32G X 4 MM MISC    pregabalin (LYRICA) 300 MG capsule TAKE ONE CAPSULE BY MOUTH EVERY EVENING   rosuvastatin (CRESTOR) 10 MG tablet TAKE ONE TABLET BY MOUTH ONCE DAILY.   sertraline (ZOLOFT) 100 MG tablet Take 1 tablet (100 mg total) by mouth 2 (two) times daily. (Patient taking differently: Take 200 mg by mouth daily.)   TRULICITY 3 0000000 SOPN INJECT 3 MG (0.5 ML) UNDER THE SKIN ONCE A WEEK   Vibegron (GEMTESA) 75 MG TABS Take 1 tablet by mouth daily.   Vitamin D, Cholecalciferol, 25 MCG (1000 UT) CAPS  Take 2,000 Int'l Units by mouth.   No facility-administered encounter medications on file as of 06/02/2022.   BP Readings from Last 3 Encounters:  05/18/22 (!) 142/82  12/28/21 120/72  09/24/21 124/68    Pulse Readings from Last 3 Encounters:  05/18/22 92  12/28/21 80  09/24/21 94    Lab Results  Component Value Date/Time   HGBA1C 6.3 (H) 05/18/2022 02:50 PM   HGBA1C 6.5 (H) 12/28/2021 03:01 PM   HGBA1C 7.5 02/12/2019 12:00 AM   Lab Results  Component Value Date   CREATININE 0.76 05/18/2022   BUN 14 05/18/2022   GFRNONAA 60 05/12/2020   GFRAA 69 05/12/2020   NA 146 (H) 05/18/2022   K 4.5 05/18/2022   CALCIUM 9.4 05/18/2022   CO2 26 05/18/2022     Elray Mcgregor, [place] Clinical Pharmacist Assistant  [phone]

## 2022-06-03 ENCOUNTER — Other Ambulatory Visit: Payer: Self-pay | Admitting: Family Medicine

## 2022-06-03 MED ORDER — GVOKE HYPOPEN 2-PACK 0.5 MG/0.1ML ~~LOC~~ SOAJ: 0.5000 mg | Freq: Every day | SUBCUTANEOUS | 2 refills | Status: DC | PRN

## 2022-06-04 ENCOUNTER — Other Ambulatory Visit: Payer: Self-pay

## 2022-06-04 MED ORDER — AMITRIPTYLINE HCL 25 MG PO TABS: 25.0000 mg | ORAL_TABLET | Freq: Every day | ORAL | 1 refills | Status: DC

## 2022-06-04 MED ORDER — MONTELUKAST SODIUM 10 MG PO TABS: ORAL_TABLET | ORAL | 1 refills | Status: DC

## 2022-06-04 MED ORDER — ROSUVASTATIN CALCIUM 10 MG PO TABS: ORAL_TABLET | ORAL | 1 refills | Status: DC

## 2022-06-04 MED ORDER — PREGABALIN 300 MG PO CAPS: 300.0000 mg | ORAL_CAPSULE | Freq: Every evening | ORAL | 1 refills | Status: DC

## 2022-06-17 ENCOUNTER — Other Ambulatory Visit: Payer: Self-pay | Admitting: Family Medicine

## 2022-06-17 ENCOUNTER — Telehealth: Payer: Self-pay

## 2022-06-17 MED ORDER — FINASTERIDE 5 MG PO TABS: 5.0000 mg | ORAL_TABLET | Freq: Every day | ORAL | 0 refills | Status: DC

## 2022-06-17 NOTE — Telephone Encounter (Signed)
Left detailed message that PRESCRIPTION was sent to the pharmacy and she can get OTC rogaine.

## 2022-06-17 NOTE — Telephone Encounter (Signed)
Patient called to check to see if we had ever sent her in anything for her Hair loss. She states that she had spoken with you about it at her last visit and you had offered to send either a shampoo or oral medication for the hair loss. She states that if we send it, it needs to go to [facility].

## 2022-06-29 ENCOUNTER — Telehealth: Payer: Self-pay

## 2022-06-29 NOTE — Telephone Encounter (Signed)
Manuela Schwartz from [facility] called and stated that as of tomorrow it will be 1 month marking since they have been trying to reach patient to get out to see her for evaluation for the home health. She states that they have re-scheduled with the husband multiple times and sometimes wont even answer the phone calls. Informed Dr. Tobie Poet of this and per Dr. Tobie Poet if patient is non compliant and still not answering their phone calls or ours at this point, it is ok per Dr. Tobie Poet that the patient be discharged and they will close out her case and no longer be trying to get in touch with patient to do evaluation. Called and informed Manuela Schwartz from [facility] that Dr. Tobie Poet is agreeable with this decision and now documented in patient's chart.

## 2022-08-02 ENCOUNTER — Other Ambulatory Visit: Payer: Self-pay

## 2022-08-05 ENCOUNTER — Other Ambulatory Visit: Payer: Self-pay | Admitting: Family Medicine

## 2022-08-12 ENCOUNTER — Telehealth: Payer: Self-pay

## 2022-08-12 NOTE — Telephone Encounter (Signed)
Mr. Bussinger called to report that Beautifull's legs are swollen and she has more pus draining from the lower leg.  He reports no fever or chills.  Symptoms reviewed with Dr. Sedalia Muta.  She advised that he take her to Urgent Care this afternoon for evaluation and treatment.

## 2022-08-16 ENCOUNTER — Telehealth: Payer: Self-pay

## 2022-08-16 NOTE — Telephone Encounter (Signed)
Patient is calling asking if we can do a tele visit for her appointment on Thursday at 130 for her fasting 3 month follow up. She states that her Mobility has really decreased since the last visit and she can barely use her walker now without falling and her husband is trying to save the money for a wheel chair and is working on getting one but didn't want to miss her follow up appointment so wanted to request to do it as a televisit. Please advise.

## 2022-08-17 NOTE — Telephone Encounter (Signed)
Left message to return call back. 

## 2022-08-19 ENCOUNTER — Ambulatory Visit: Payer: Medicare Other | Admitting: Family Medicine

## 2022-08-24 DIAGNOSIS — I872 Venous insufficiency (chronic) (peripheral): Secondary | ICD-10-CM | POA: Diagnosis not present

## 2022-08-24 DIAGNOSIS — E1159 Type 2 diabetes mellitus with other circulatory complications: Secondary | ICD-10-CM | POA: Diagnosis not present

## 2022-08-24 DIAGNOSIS — I152 Hypertension secondary to endocrine disorders: Secondary | ICD-10-CM | POA: Diagnosis not present

## 2022-08-24 DIAGNOSIS — M797 Fibromyalgia: Secondary | ICD-10-CM | POA: Diagnosis not present

## 2022-08-24 DIAGNOSIS — G4733 Obstructive sleep apnea (adult) (pediatric): Secondary | ICD-10-CM | POA: Diagnosis not present

## 2022-08-24 DIAGNOSIS — E1144 Type 2 diabetes mellitus with diabetic amyotrophy: Secondary | ICD-10-CM | POA: Diagnosis not present

## 2022-08-24 DIAGNOSIS — Z6841 Body Mass Index (BMI) 40.0 and over, adult: Secondary | ICD-10-CM | POA: Diagnosis not present

## 2022-08-24 DIAGNOSIS — K439 Ventral hernia without obstruction or gangrene: Secondary | ICD-10-CM | POA: Diagnosis not present

## 2022-08-29 ENCOUNTER — Other Ambulatory Visit: Payer: Self-pay | Admitting: Family Medicine

## 2022-08-29 DIAGNOSIS — N3941 Urge incontinence: Secondary | ICD-10-CM

## 2022-09-01 ENCOUNTER — Telehealth: Payer: Self-pay

## 2022-09-01 NOTE — Progress Notes (Signed)
Care Management & Coordination Services Pharmacy Team  Reason for Encounter: Medication coordination and delivery  Contacted patient to discuss medications and coordinate delivery from [facility].  Unsuccessful outreach. Left voicemail for patient to return call.  C[facility] dispensing form sent to [facility] for review.   Last adherence delivery date: 06/14/22      Patient is due for next adherence delivery on: 09/13/22  This delivery to include: Vials  90 Days  Gemtesa 75 mg 1 tab once daily Lisinopril 40 mg 1 tab at breakfast and bedtime   Finasteride 5mg  1 tab daily  Montelukast 10 mg 1 tab at bedtime Rosuvastatin 10 mg 1 tab once daily Sertraline 100 mg 2 tabs once daily Pregabalin 300 mg 1 capsule every evening Amitriptyline 25 mg 1 tab at bedtime Xanax 1 mg 1 tab four times daily prn  onetouch verio test strips                     onetouch delica lancets       Patient declined the following medications this month: Glucagon pen- Filled on 06/10/22. Only uses prn Tresiba Flextouch Gets through PAP Trulicity 3mg - PAP. Furosemide 20mg - Pt is only taking this M-W-F due to the incontinence issues. She is wearing depends in case of an accident and pt cannot get to the bathroom quick enough. Pt does not need this delivered yet.  Refills requested from providers include: Finasteride 5mg    Delivery scheduled for 09/13/22. Unable to speak with patient to confirm date.    Chart review: Recent office visits:  06/17/22 Blane Ohara MD. Orders Only. Ordered Finasteride 5mg  daily.   06/03/22 Blane Ohara MD. Orders Only. Ordered Glucagon 0.5mg  for hypoglycemia.   Recent consult visits:  08/24/22 Selena Batten MD (Gen Surgery) Seen for follow up. No med changes.   Hospital visits:  None  Medications: Outpatient Encounter Medications as of 09/01/2022  Medication Sig   acetaminophen (TYLENOL) 325 MG tablet Take 325 mg by mouth every 6 (six) hours as needed.   ALPRAZolam  (XANAX) 1 MG tablet Take 1 mg by mouth in the morning, at noon, in the evening, and at bedtime.    amitriptyline (ELAVIL) 25 MG tablet Take 1 tablet (25 mg total) by mouth at bedtime.   Blood Glucose Monitoring Suppl (ONETOUCH PING METER REMOTE) Supplies MISC 1 each by Does not apply route 4 (four) times daily -  before meals and at bedtime.   Blood Glucose Monitoring Suppl (ONETOUCH VERIO FLEX SYSTEM) w/Device KIT USE TO check blood glucose AS DIRECTED   COMFORT EZ PEN NEEDLES 31G X 5 MM MISC E11.44 use new needle with each new injection   finasteride (PROSCAR) 5 MG tablet Take 1 tablet (5 mg total) by mouth daily.   furosemide (LASIX) 20 MG tablet Take 1 tablet (20 mg total) by mouth daily. (Patient taking differently: Take 20 mg by mouth 3 (three) times a week.)   GEMTESA 75 MG TABS TAKE ONE TABLET BY MOUTH ONCE DAILY   Glucagon (GVOKE HYPOPEN 2-PACK) 0.5 MG/0.1ML SOAJ Inject 0.5 mg into the skin daily as needed (hypoglycemia).   insulin degludec (TRESIBA FLEXTOUCH) 100 UNIT/ML FlexTouch Pen Inject 65 Units into the skin daily. (Patient taking differently: Inject 60 Units into the skin daily.)   Lancets (ONETOUCH DELICA PLUS LANCET33G) MISC USE TO check blood glucose three times daily AS DIRECTED   lisinopril (ZESTRIL) 40 MG tablet TAKE ONE TABLET BY MOUTH EVERY MORNING and TAKE ONE TABLET BY  MOUTH EVERYDAY AT BEDTIME   montelukast (SINGULAIR) 10 MG tablet Take 1 tablet by mouth daily   Multiple Vitamins-Minerals (CENTRUM SILVER PO) Take 1 tablet by mouth daily.   NOVOFINE PLUS PEN NEEDLE 32G X 4 MM MISC    ONETOUCH VERIO test strip USE TO check blood glucose three times daily AS DIRECTED   pregabalin (LYRICA) 300 MG capsule Take 1 capsule (300 mg total) by mouth every evening.   rosuvastatin (CRESTOR) 10 MG tablet TAKE ONE TABLET BY MOUTH ONCE DAILY.   sertraline (ZOLOFT) 100 MG tablet Take 1 tablet (100 mg total) by mouth 2 (two) times daily. (Patient taking differently: Take 200 mg by mouth  daily.)   TRULICITY 3 MG/0.5ML SOPN INJECT 3 MG (0.5 ML) UNDER THE SKIN ONCE A WEEK   Vitamin D, Cholecalciferol, 25 MCG (1000 UT) CAPS Take 2,000 Int'l Units by mouth.   No facility-administered encounter medications on file as of 09/01/2022.   BP Readings from Last 3 Encounters:  05/18/22 (!) 142/82  12/28/21 120/72  09/24/21 124/68    Pulse Readings from Last 3 Encounters:  05/18/22 92  12/28/21 80  09/24/21 94    Lab Results  Component Value Date/Time   HGBA1C 6.3 (H) 05/18/2022 02:50 PM   HGBA1C 6.5 (H) 12/28/2021 03:01 PM   HGBA1C 7.5 02/12/2019 12:00 AM   Lab Results  Component Value Date   CREATININE 0.76 05/18/2022   BUN 14 05/18/2022   GFRNONAA 60 05/12/2020   GFRAA 69 05/12/2020   NA 146 (H) 05/18/2022   K 4.5 05/18/2022   CALCIUM 9.4 05/18/2022   CO2 26 05/18/2022     Roxana Hires, CMA Clinical Pharmacist Assistant  [phone]

## 2022-09-03 ENCOUNTER — Other Ambulatory Visit: Payer: Self-pay

## 2022-09-03 MED ORDER — FINASTERIDE 5 MG PO TABS: 5.0000 mg | ORAL_TABLET | Freq: Every day | ORAL | 0 refills | Status: DC

## 2022-09-10 DIAGNOSIS — Z79899 Other long term (current) drug therapy: Secondary | ICD-10-CM | POA: Diagnosis not present

## 2022-09-10 DIAGNOSIS — K430 Incisional hernia with obstruction, without gangrene: Secondary | ICD-10-CM | POA: Diagnosis not present

## 2022-09-10 HISTORY — PX: HERNIA REPAIR: SHX51

## 2022-09-11 DIAGNOSIS — K439 Ventral hernia without obstruction or gangrene: Secondary | ICD-10-CM | POA: Diagnosis not present

## 2022-09-28 DIAGNOSIS — Z48817 Encounter for surgical aftercare following surgery on the skin and subcutaneous tissue: Secondary | ICD-10-CM | POA: Diagnosis not present

## 2022-09-28 DIAGNOSIS — Z8719 Personal history of other diseases of the digestive system: Secondary | ICD-10-CM | POA: Diagnosis not present

## 2022-10-28 ENCOUNTER — Ambulatory Visit (INDEPENDENT_AMBULATORY_CARE_PROVIDER_SITE_OTHER): Payer: No Typology Code available for payment source | Admitting: Family Medicine

## 2022-10-28 ENCOUNTER — Encounter: Payer: Self-pay | Admitting: Family Medicine

## 2022-10-28 VITALS — BP 142/84 | HR 88 | Temp 97.2°F | Resp 18 | Ht 72.0 in | Wt 320.0 lb

## 2022-10-28 DIAGNOSIS — I152 Hypertension secondary to endocrine disorders: Secondary | ICD-10-CM

## 2022-10-28 DIAGNOSIS — E1159 Type 2 diabetes mellitus with other circulatory complications: Secondary | ICD-10-CM | POA: Diagnosis not present

## 2022-10-28 DIAGNOSIS — E782 Mixed hyperlipidemia: Secondary | ICD-10-CM

## 2022-10-28 DIAGNOSIS — Z7985 Long-term (current) use of injectable non-insulin antidiabetic drugs: Secondary | ICD-10-CM | POA: Diagnosis not present

## 2022-10-28 DIAGNOSIS — L03115 Cellulitis of right lower limb: Secondary | ICD-10-CM | POA: Diagnosis not present

## 2022-10-28 DIAGNOSIS — R600 Localized edema: Secondary | ICD-10-CM

## 2022-10-28 DIAGNOSIS — Z794 Long term (current) use of insulin: Secondary | ICD-10-CM | POA: Diagnosis not present

## 2022-10-28 DIAGNOSIS — H1032 Unspecified acute conjunctivitis, left eye: Secondary | ICD-10-CM | POA: Diagnosis not present

## 2022-10-28 DIAGNOSIS — E1144 Type 2 diabetes mellitus with diabetic amyotrophy: Secondary | ICD-10-CM | POA: Diagnosis not present

## 2022-10-28 DIAGNOSIS — L03116 Cellulitis of left lower limb: Secondary | ICD-10-CM

## 2022-10-28 MED ORDER — ERYTHROMYCIN 5 MG/GM OP OINT
1.0000 | TOPICAL_OINTMENT | Freq: Every day | OPHTHALMIC | 0 refills | Status: DC
Start: 2022-10-28 — End: 2022-11-18

## 2022-10-28 NOTE — Assessment & Plan Note (Signed)
Well controlled.  No changes to medicines. Lisinopril 40 mg twice daily.  Continue to work on eating a healthy diet and exercise.  Labs drawn today.

## 2022-10-28 NOTE — Assessment & Plan Note (Signed)
Control: good Recommend check sugars fasting daily. Recommend check feet daily. Recommend annual eye exams. Medicines: Continue Trulicity 3 mg once weekly. Also taking Tresiba 60 units qhs, Lyrica 300 mg one tablet twice daily. Continue to work on eating a healthy diet and exercise.  Labs drawn today.

## 2022-10-28 NOTE — Progress Notes (Unsigned)
Subjective:  Patient ID: Christy Crawford, female    DOB: May 07, 1955  Age: 67 y.o. MRN: [id number]  Chief Complaint  Patient presents with   Medical Management of Chronic Issues    HPI Diabetes:  Complications: Diabetic macular edema. Amyotropy/neuropathy. Glucose checking: Patient checks sugars three times daily. Glucose logs: lowest 70s and highest 150s Hypoglycemia: a few readings in the 70's, but patient does not have symptoms or feel like its low.  Most recent A1C: 6.8 Current medications: Patient is  still currently taking Trulicity 3 mg once weekly. Also taking Tresiba 65 units qhs, Lyrica 300 mg one tablet twice daily. Last Eye Exam. 12/04/21. Seeing every 6 months. Foot checks: Patient checks her feet daily.  Hyperlipidemia: Current medications: Patient is currently taking Rosuvastatin 10 mg take 1 tablet daily.  Hypertension: Current medications: Patient is currently taking Lisinopril 40 mg twice daily.  Depression: Patient is currently taking Zoloft 100 mg twice daily. On xanax 1 mg oral three times daily.   Sees Dr Barnett Abu every 6 months.   Urge incontinence: on gemtesa 75 mg daily. Doing very well.  Diet: healthy Exercise: Patient is unable to exercise due to Amyotrophy/Neuropathy.     10/28/2022    3:03 PM 05/18/2022    1:38 PM 04/13/2022    1:30 PM 09/24/2021    3:17 PM 06/15/2021    2:05 PM  Depression screen PHQ 2/9  Decreased Interest 1 0 1 0 0  Down, Depressed, Hopeless 0 0 0 0 0  PHQ - 2 Score 1 0 1 0 0  Altered sleeping 2 1  0 0  Tired, decreased energy 1 3  1 1   Change in appetite 2 1  0 0  Feeling bad or failure about yourself  0 1  0 0  Trouble concentrating 0 0  0 0  Moving slowly or fidgety/restless 0 1  0 0  Suicidal thoughts 0 0  0 0  PHQ-9 Score 6 7  1 1   Difficult doing work/chores Somewhat difficult Somewhat difficult  Not difficult at all Not difficult at all        10/28/2022    3:03 PM  Fall Risk   Falls in the past year? 1   Number falls in past yr: 1  Injury with Fall? 1  Risk for fall due to : History of fall(s)  Follow up Falls evaluation completed;Falls prevention discussed    Patient Care Team: Blane Ohara, MD as PCP - General (Family Medicine) Levert Feinstein, MD as Consulting Physician (Neurology) Milagros Evener, MD as Consulting Physician (Psychiatry) Albin Felling, OD (Optometry) Earvin Hansen, [facility] (Inactive) as Pharmacist (Pharmacist)   Review of Systems  Constitutional:  Negative for chills, fatigue and fever.  HENT:  Positive for ear pain (red and itching). Negative for congestion, rhinorrhea and sore throat.   Respiratory:  Negative for cough and shortness of breath.   Cardiovascular:  Positive for leg swelling. Negative for chest pain.  Gastrointestinal:  Negative for abdominal pain, constipation, diarrhea, nausea and vomiting.  Genitourinary:  Negative for dysuria and urgency.  Musculoskeletal:  Positive for arthralgias, back pain and myalgias.  Neurological:  Negative for dizziness, weakness, light-headedness and headaches.  Psychiatric/Behavioral:  Negative for dysphoric mood. The patient is not nervous/anxious.     Current Outpatient Medications on File Prior to Visit  Medication Sig Dispense Refill   acetaminophen (TYLENOL) 325 MG tablet Take 325 mg by mouth every 6 (six) hours as needed.  ALPRAZolam (XANAX) 1 MG tablet Take 1 mg by mouth in the morning, at noon, in the evening, and at bedtime.      amitriptyline (ELAVIL) 25 MG tablet Take 1 tablet (25 mg total) by mouth at bedtime. 90 tablet 1   Blood Glucose Monitoring Suppl (ONETOUCH VERIO FLEX SYSTEM) w/Device KIT USE TO check blood glucose AS DIRECTED     COMFORT EZ PEN NEEDLES 31G X 5 MM MISC E11.44 use new needle with each new injection 100 each 2   finasteride (PROSCAR) 5 MG tablet Take 1 tablet (5 mg total) by mouth daily. 90 tablet 0   GEMTESA 75 MG TABS TAKE ONE TABLET BY MOUTH ONCE DAILY 90 tablet 1   Glucagon (GVOKE  HYPOPEN 2-PACK) 0.5 MG/0.1ML SOAJ Inject 0.5 mg into the skin daily as needed (hypoglycemia). 0.2 mL 2   insulin degludec (TRESIBA FLEXTOUCH) 100 UNIT/ML FlexTouch Pen Inject 65 Units into the skin daily. (Patient taking differently: Inject 60 Units into the skin daily.) 60 mL 0   Lancets (ONETOUCH DELICA PLUS LANCET33G) MISC USE TO check blood glucose three times daily AS DIRECTED 100 each 12   lisinopril (ZESTRIL) 40 MG tablet TAKE ONE TABLET BY MOUTH EVERY MORNING and TAKE ONE TABLET BY MOUTH EVERYDAY AT BEDTIME 180 tablet 1   montelukast (SINGULAIR) 10 MG tablet Take 1 tablet by mouth daily 90 tablet 1   Multiple Vitamins-Minerals (CENTRUM SILVER PO) Take 1 tablet by mouth daily.     NOVOFINE PLUS PEN NEEDLE 32G X 4 MM MISC      ONETOUCH VERIO test strip USE TO check blood glucose three times daily AS DIRECTED 100 strip 12   pregabalin (LYRICA) 300 MG capsule Take 1 capsule (300 mg total) by mouth every evening. 90 capsule 1   rosuvastatin (CRESTOR) 10 MG tablet TAKE ONE TABLET BY MOUTH ONCE DAILY. 90 tablet 1   sertraline (ZOLOFT) 100 MG tablet Take 1 tablet (100 mg total) by mouth 2 (two) times daily. (Patient taking differently: Take 200 mg by mouth daily.) 180 tablet 1   TRULICITY 3 MG/0.5ML SOPN INJECT 3 MG (0.5 ML) UNDER THE SKIN ONCE A WEEK 8 mL 0   Vitamin D, Cholecalciferol, 25 MCG (1000 UT) CAPS Take 2,000 Int'l Units by mouth.     No current facility-administered medications on file prior to visit.   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   lens implants Bilateral     Family History  Adopted: Yes  Problem Relation Age of Onset   Diabetes Maternal Aunt    Social History   Socioeconomic History   Marital status: Married    Spouse name: Not on file   Number of children: 1   Years of education: Not on file   Highest education level: Not on file  Occupational History   Occupation: disabled  Tobacco Use  Smoking status: Former    Current packs/day: 0.00    Types: Cigarettes    Quit date: 2000    Years since quitting: 24.5   Smokeless tobacco: Never  Vaping Use   Vaping status: Never Used  Substance and Sexual Activity   Alcohol use: Yes    Alcohol/week: 1.0 standard drink of alcohol    Types: 1 Glasses of wine per week    Comment: socially   Drug use: Never   Sexual activity: Not on file  Other Topics Concern   Not on file  Social History Narrative   Not on file   Social Determinants of Health   Financial Resource Strain: Not on file  Food Insecurity: Low Risk  (09/10/2022)   Received from [facility]   Food vital sign    Within the past 12 months, you worried that your food would run out before you got money to buy more: Never true    Within the past 12 months, the food you bought just didn't last and you didn't have money to get more. : Never true  Transportation Needs: Not on file (09/10/2022)  Physical Activity: Not on file  Stress: Not on file  Social Connections: Not on file    Objective:  BP (!) 142/84   Pulse 88   Temp (!) 97.2 F (36.2 C)   Resp 18   Ht 6' (1.829 m)   Wt (!) 320 lb (145.2 kg) Comment: when weighed at hospital  BMI 43.40 kg/m      10/28/2022    3:09 PM 10/28/2022    2:55 PM 05/18/2022    2:21 PM  BP/Weight  Systolic BP 142 154 142  Diastolic BP 84 100 82  Wt. (Lbs) 320    BMI 43.4 kg/m2      Physical Exam Vitals reviewed.  Constitutional:      Appearance:  Normal appearance. She is normal weight.  Eyes:     General:        Left eye: Discharge (erythamatous) present. Neck:     Vascular: No carotid bruit.  Cardiovascular:     Rate and Rhythm: Normal rate and regular rhythm.     Heart sounds: Normal heart sounds.  Pulmonary:     Effort: Pulmonary effort is normal. No respiratory distress.     Breath sounds: Normal breath sounds.  Abdominal:     General: Abdomen is flat. Bowel sounds are normal.     Palpations: Abdomen is soft.     Tenderness: There is no abdominal tenderness.  Musculoskeletal:     Right lower leg: Edema present.     Left lower leg: Edema present.  Skin:    Comments: BL leg weeping. Macerated wounds BL legs.  Neurological:     Mental Status: She is alert and oriented to person, place, and time.  Psychiatric:        Mood and Affect: Mood normal.        Behavior: Behavior normal.     Diabetic Foot Exam - Simple   Simple Foot Form  10/28/2022  4:08 AM  Visual Inspection No deformities, no ulcerations, no other skin breakdown bilaterally: Yes Sensation Testing See comments: Yes Pulse Check Posterior Tibialis and Dorsalis pulse intact bilaterally: Yes Comments Decreased sensation.      Lab Results  Component Value Date   WBC 6.4 05/18/2022   HGB 14.0 05/18/2022   HCT 43.8 05/18/2022   PLT 191 05/18/2022   GLUCOSE 62 (L) 05/18/2022  CHOL 144 05/18/2022   TRIG 112 05/18/2022   HDL 64 05/18/2022   LDLCALC 60 05/18/2022   ALT 19 05/18/2022   AST 32 05/18/2022   NA 146 (H) 05/18/2022   K 4.5 05/18/2022   CL 105 05/18/2022   CREATININE 0.76 05/18/2022   BUN 14 05/18/2022   CO2 26 05/18/2022   TSH 1.780 05/18/2022   HGBA1C 6.3 (H) 05/18/2022   MICROALBUR 10 12/02/2020      Assessment & Plan:    Hypertension associated with diabetes (HCC) Assessment & Plan: Well controlled.  No changes to medicines. Lisinopril 40 mg twice daily.  Continue to work on eating a healthy diet and exercise.  Labs  drawn today.  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 Recommend check feet daily. Recommend annual eye exams. Medicines: Continue Trulicity 3 mg once weekly. Also taking Tresiba 60 units qhs, Lyrica 300 mg one tablet twice daily. Continue to work on eating a healthy diet and exercise.  Labs drawn today.    Orders: -     Hemoglobin A1c; Future  Mixed hyperlipidemia Assessment & Plan: Well controlled.  No changes to medicines. Rosuvastatin 10 mg take 1 tablet daily.  Continue to work on eating a healthy diet and exercise.  Labs drawn today.  Orders: -     Lipid panel; Future  Cellulitis of left lower leg Assessment & Plan: Order apply Unna boot.  Orders: -     Apply unna boot  Cellulitis of right lower leg Assessment & Plan: Order apply unna boot.  Orders: -     Apply unna boot  Acute bacterial conjunctivitis of left eye Assessment & Plan: Erythromycin ointment for eye.   Orders: -     Erythromycin; Place 1 Application into the left eye at bedtime.  Dispense: 3.5 g; Refill: 0  Other orders -     Furosemide; Take 1 tablet (20 mg total) by mouth 3 (three) times a week.     Meds ordered this encounter  Medications   erythromycin ophthalmic ointment    Sig: Place 1 Application into the left eye at bedtime.    Dispense:  3.5 g    Refill:  0   furosemide (LASIX) 20 MG tablet    Sig: Take 1 tablet (20 mg total) by mouth 3 (three) times a week.    Orders Placed This Encounter  Procedures   CBC with Differential/Platelet   Comprehensive metabolic panel   Hemoglobin A1c   Lipid panel   Apply unna boot     Follow-up: Return in about 5 days (around 11/02/2022) for Nurse visit - unna boots.Clayborn Bigness I Leal-Borjas,acting as a scribe for Blane Ohara, MD.,have  documented all relevant documentation on the behalf of Blane Ohara, MD,as directed by  Blane Ohara, MD while in the presence of Blane Ohara, MD.   An After Visit Summary was printed and given to the patient.  Blane Ohara, MD [facility] [phone]

## 2022-10-28 NOTE — Assessment & Plan Note (Signed)
Well controlled.  No changes to medicines. Rosuvastatin 10 mg take 1 tablet daily.  Continue to work on eating a healthy diet and exercise.  Labs drawn today. 

## 2022-10-28 NOTE — Patient Instructions (Signed)
Erythromycin ointment for eye.   Call dr. Precious Bard to set up a visit.

## 2022-10-29 ENCOUNTER — Encounter: Payer: Self-pay | Admitting: Family Medicine

## 2022-10-30 DIAGNOSIS — H1032 Unspecified acute conjunctivitis, left eye: Secondary | ICD-10-CM | POA: Insufficient documentation

## 2022-10-30 DIAGNOSIS — L03115 Cellulitis of right lower limb: Secondary | ICD-10-CM | POA: Insufficient documentation

## 2022-10-30 DIAGNOSIS — L03116 Cellulitis of left lower limb: Secondary | ICD-10-CM | POA: Insufficient documentation

## 2022-10-30 NOTE — Assessment & Plan Note (Signed)
Erythromycin ointment for eye.

## 2022-10-30 NOTE — Assessment & Plan Note (Signed)
Order apply unna boot.

## 2022-10-30 NOTE — Assessment & Plan Note (Signed)
Order apply [employer].

## 2022-11-01 ENCOUNTER — Encounter: Payer: Self-pay | Admitting: Family Medicine

## 2022-11-01 MED ORDER — FUROSEMIDE 20 MG PO TABS: 20.0000 mg | ORAL_TABLET | ORAL | Status: DC

## 2022-11-02 ENCOUNTER — Ambulatory Visit (INDEPENDENT_AMBULATORY_CARE_PROVIDER_SITE_OTHER): Payer: No Typology Code available for payment source

## 2022-11-02 DIAGNOSIS — L03115 Cellulitis of right lower limb: Secondary | ICD-10-CM | POA: Diagnosis not present

## 2022-11-02 DIAGNOSIS — L03116 Cellulitis of left lower limb: Secondary | ICD-10-CM | POA: Diagnosis not present

## 2022-11-02 DIAGNOSIS — E1144 Type 2 diabetes mellitus with diabetic amyotrophy: Secondary | ICD-10-CM

## 2022-11-02 DIAGNOSIS — E1159 Type 2 diabetes mellitus with other circulatory complications: Secondary | ICD-10-CM

## 2022-11-02 DIAGNOSIS — E782 Mixed hyperlipidemia: Secondary | ICD-10-CM

## 2022-11-02 NOTE — Progress Notes (Signed)
Patient is in office today for a nurse visit for removing and applying bilateral Unna Boots. Patient Tolerated well and we re-applied bilateral unna boots and had patient schedule a follow up appointment for this Friday to recheck legs again.

## 2022-11-05 ENCOUNTER — Ambulatory Visit: Payer: No Typology Code available for payment source

## 2022-11-18 ENCOUNTER — Telehealth: Payer: Self-pay

## 2022-11-18 DIAGNOSIS — H1032 Unspecified acute conjunctivitis, left eye: Secondary | ICD-10-CM

## 2022-11-18 MED ORDER — ERYTHROMYCIN 5 MG/GM OP OINT: 1.0000 | TOPICAL_OINTMENT | Freq: Every day | OPHTHALMIC | 0 refills | Status: DC

## 2022-11-18 NOTE — Telephone Encounter (Signed)
Christy Crawford called with questions about her Christy Crawford and Christy Crawford patient assistance.   She was given the phone numbers in the chart and she reached out about the Christy Crawford but she was unable to get through to the [employer].  They are going to send forms for Dr. Sedalia Muta to complete.  Christy Crawford also is requesting a refill on her medication for her eye infection.  The eye was completely better but now the symptoms have returned.  Dr. Sedalia Muta approved the refill.  Rx was sent to [facility].

## 2022-11-28 ENCOUNTER — Other Ambulatory Visit: Payer: Self-pay | Admitting: Family Medicine

## 2022-12-01 NOTE — Telephone Encounter (Signed)
Patient is calling to inquire about the status of her Patient assistance paperwork and wanting to know if we have received it and if it has been filled out because she states that she only has 1 week and half left of her Christy Crawford and she will be out of the medication. She states that she had called about a week or two ago and the patient assistance place for her tresiba and her trulicity was supposed to be sending out paperwork for her to fill out and dr. Sedalia Muta. Please advise.

## 2022-12-07 NOTE — Telephone Encounter (Signed)
We do have a box of Christy Crawford that she will pick-up tomorrow.

## 2022-12-07 NOTE — Telephone Encounter (Signed)
Patient states she printed out PAP paperwork for trulicity and tresiba, plans to bring over paperwork tomorrow. Will be out of medication on Sunday.

## 2022-12-09 ENCOUNTER — Other Ambulatory Visit: Payer: Self-pay

## 2022-12-09 DIAGNOSIS — F17211 Nicotine dependence, cigarettes, in remission: Secondary | ICD-10-CM | POA: Diagnosis not present

## 2022-12-09 DIAGNOSIS — R2681 Unsteadiness on feet: Secondary | ICD-10-CM | POA: Diagnosis not present

## 2022-12-09 DIAGNOSIS — Z6841 Body Mass Index (BMI) 40.0 and over, adult: Secondary | ICD-10-CM | POA: Diagnosis not present

## 2022-12-09 DIAGNOSIS — F3341 Major depressive disorder, recurrent, in partial remission: Secondary | ICD-10-CM | POA: Diagnosis not present

## 2022-12-09 DIAGNOSIS — I700 Atherosclerosis of aorta: Secondary | ICD-10-CM | POA: Diagnosis not present

## 2022-12-09 DIAGNOSIS — G545 Neuralgic amyotrophy: Secondary | ICD-10-CM | POA: Diagnosis not present

## 2022-12-09 DIAGNOSIS — Z008 Encounter for other general examination: Secondary | ICD-10-CM | POA: Diagnosis not present

## 2022-12-09 MED ORDER — TRULICITY 3 MG/0.5ML ~~LOC~~ SOAJ: 3.0000 mg | SUBCUTANEOUS | 1 refills | Status: DC

## 2022-12-09 MED ORDER — TRESIBA FLEXTOUCH 100 UNIT/ML ~~LOC~~ SOPN: 65.0000 [IU] | PEN_INJECTOR | Freq: Every day | SUBCUTANEOUS | 0 refills | Status: DC

## 2022-12-17 ENCOUNTER — Other Ambulatory Visit: Payer: Self-pay

## 2022-12-20 MED ORDER — FINASTERIDE 5 MG PO TABS: 5.0000 mg | ORAL_TABLET | Freq: Every day | ORAL | 0 refills | Status: DC

## 2022-12-26 ENCOUNTER — Other Ambulatory Visit: Payer: Self-pay | Admitting: Family Medicine

## 2023-01-06 DIAGNOSIS — G545 Neuralgic amyotrophy: Secondary | ICD-10-CM | POA: Diagnosis not present

## 2023-01-20 ENCOUNTER — Other Ambulatory Visit: Payer: Self-pay | Admitting: Family Medicine

## 2023-01-20 DIAGNOSIS — N3941 Urge incontinence: Secondary | ICD-10-CM

## 2023-02-03 NOTE — Assessment & Plan Note (Addendum)
Control: good Recommend check sugars fasting daily. Recommend check feet daily. Recommend annual eye exams. Medicines: Continue Trulicity 3 mg once weekly. Also taking Tresiba 65 units qhs, Lyrica 300 mg one tablet twice daily. Continue to work on eating a healthy diet and exercise.  Labs drawn today.    Referring to Neurology

## 2023-02-03 NOTE — Assessment & Plan Note (Signed)
Well controlled.  No changes to medicines. Rosuvastatin 10 mg take 1 tablet daily.  Continue to work on eating a healthy diet and exercise.  Labs drawn today. 

## 2023-02-03 NOTE — Assessment & Plan Note (Addendum)
Well controlled.  DECREASE LISINOPRIL 40 MG ONCE DAILY.  Continue to work on eating a healthy diet and exercise.  Labs drawn today.

## 2023-02-04 ENCOUNTER — Ambulatory Visit (INDEPENDENT_AMBULATORY_CARE_PROVIDER_SITE_OTHER): Payer: No Typology Code available for payment source | Admitting: Family Medicine

## 2023-02-04 ENCOUNTER — Encounter: Payer: Self-pay | Admitting: Family Medicine

## 2023-02-04 VITALS — BP 136/80 | HR 98 | Temp 97.8°F | Ht 73.8 in | Wt 325.0 lb

## 2023-02-04 DIAGNOSIS — I152 Hypertension secondary to endocrine disorders: Secondary | ICD-10-CM

## 2023-02-04 DIAGNOSIS — E1144 Type 2 diabetes mellitus with diabetic amyotrophy: Secondary | ICD-10-CM | POA: Diagnosis not present

## 2023-02-04 DIAGNOSIS — E782 Mixed hyperlipidemia: Secondary | ICD-10-CM

## 2023-02-04 DIAGNOSIS — I872 Venous insufficiency (chronic) (peripheral): Secondary | ICD-10-CM | POA: Insufficient documentation

## 2023-02-04 DIAGNOSIS — Z1231 Encounter for screening mammogram for malignant neoplasm of breast: Secondary | ICD-10-CM

## 2023-02-04 DIAGNOSIS — E1159 Type 2 diabetes mellitus with other circulatory complications: Secondary | ICD-10-CM

## 2023-02-04 DIAGNOSIS — I890 Lymphedema, not elsewhere classified: Secondary | ICD-10-CM | POA: Insufficient documentation

## 2023-02-04 MED ORDER — SELENIUM SULFIDE 2.25 % EX SHAM: 1.0000 | MEDICATED_SHAMPOO | CUTANEOUS | 3 refills | Status: DC

## 2023-02-04 NOTE — Patient Instructions (Signed)
DECREASE LISINOPRIL 40 MG ONCE DAILY.

## 2023-02-05 NOTE — Assessment & Plan Note (Signed)
Referring to Dermatology.

## 2023-02-05 NOTE — Assessment & Plan Note (Signed)
Referring to dermatology.

## 2023-02-06 LAB — CBC WITH DIFFERENTIAL/PLATELET
Basophils Absolute: 0.1 10*3/uL (ref 0.0–0.2)
Basos: 1 %
EOS (ABSOLUTE): 0.2 10*3/uL (ref 0.0–0.4)
Eos: 3 %
Hematocrit: 43.8 % (ref 34.0–46.6)
Hemoglobin: 13.9 g/dL (ref 11.1–15.9)
Immature Grans (Abs): 0.1 10*3/uL (ref 0.0–0.1)
Immature Granulocytes: 1 %
Lymphocytes Absolute: 1.2 10*3/uL (ref 0.7–3.1)
Lymphs: 17 %
MCH: 27.9 pg (ref 26.6–33.0)
MCHC: 31.7 g/dL (ref 31.5–35.7)
MCV: 88 fL (ref 79–97)
Monocytes Absolute: 0.5 10*3/uL (ref 0.1–0.9)
Monocytes: 7 %
Neutrophils Absolute: 5 10*3/uL (ref 1.4–7.0)
Neutrophils: 71 %
Platelets: 194 10*3/uL (ref 150–450)
RBC: 4.99 x10E6/uL (ref 3.77–5.28)
RDW: 14.8 % (ref 11.7–15.4)
WBC: 7 10*3/uL (ref 3.4–10.8)

## 2023-02-06 LAB — COMPREHENSIVE METABOLIC PANEL
ALT: 23 [IU]/L (ref 0–32)
AST: 34 [IU]/L (ref 0–40)
Albumin: 4.2 g/dL (ref 3.9–4.9)
Alkaline Phosphatase: 140 [IU]/L — ABNORMAL HIGH (ref 44–121)
BUN/Creatinine Ratio: 19 (ref 12–28)
BUN: 16 mg/dL (ref 8–27)
Bilirubin Total: 0.4 mg/dL (ref 0.0–1.2)
CO2: 30 mmol/L — ABNORMAL HIGH (ref 20–29)
Calcium: 9.4 mg/dL (ref 8.7–10.3)
Chloride: 101 mmol/L (ref 96–106)
Creatinine, Ser: 0.83 mg/dL (ref 0.57–1.00)
Globulin, Total: 2.2 g/dL (ref 1.5–4.5)
Glucose: 125 mg/dL — ABNORMAL HIGH (ref 70–99)
Potassium: 5.3 mmol/L — ABNORMAL HIGH (ref 3.5–5.2)
Sodium: 142 mmol/L (ref 134–144)
Total Protein: 6.4 g/dL (ref 6.0–8.5)
eGFR: 77 mL/min/{1.73_m2} (ref >59)

## 2023-02-06 LAB — MICROALBUMIN / CREATININE URINE RATIO
Creatinine, Urine: 43.4 mg/dL
Microalb/Creat Ratio: 11 mg/g{creat} (ref 0–29)
Microalbumin, Urine: 4.9 ug/mL

## 2023-02-06 LAB — LIPID PANEL
Chol/HDL Ratio: 2.5 ratio (ref 0.0–4.4)
Cholesterol, Total: 141 mg/dL (ref 100–199)
HDL: 56 mg/dL (ref >39)
LDL Chol Calc (NIH): 63 mg/dL (ref 0–99)
Triglycerides: 127 mg/dL (ref 0–149)
VLDL Cholesterol Cal: 22 mg/dL (ref 5–40)

## 2023-02-06 LAB — HEMOGLOBIN A1C
Est. average glucose Bld gHb Est-mCnc: 160 mg/dL
Hgb A1c MFr Bld: 7.2 % — ABNORMAL HIGH (ref 4.8–5.6)

## 2023-02-08 ENCOUNTER — Encounter: Payer: Self-pay | Admitting: Family Medicine

## 2023-02-08 ENCOUNTER — Telehealth: Payer: Self-pay

## 2023-02-08 NOTE — Telephone Encounter (Signed)
Patient called and left message, she stated that she was here on Friday and that you were going to call in a shampoo for her. She has not heard  anything from pharmacy. If you have not sent it in whe would like it sent in to [facility].

## 2023-02-10 ENCOUNTER — Other Ambulatory Visit: Payer: Self-pay | Admitting: Family Medicine

## 2023-02-16 ENCOUNTER — Other Ambulatory Visit: Payer: Self-pay | Admitting: Family Medicine

## 2023-02-16 MED ORDER — PREGABALIN 150 MG PO CAPS: 150.0000 mg | ORAL_CAPSULE | Freq: Two times a day (BID) | ORAL | 1 refills | Status: DC

## 2023-02-18 ENCOUNTER — Other Ambulatory Visit: Payer: Self-pay

## 2023-02-18 ENCOUNTER — Other Ambulatory Visit: Payer: Self-pay | Admitting: Family Medicine

## 2023-02-18 DIAGNOSIS — N3941 Urge incontinence: Secondary | ICD-10-CM

## 2023-02-27 IMAGING — MG MM DIGITAL SCREENING BILAT W/ TOMO AND CAD
8 series · 8 of 24 positions shown · non-contrast
Comparison: Previous exam(s).

CLINICAL DATA: Screening.

EXAM:
DIGITAL SCREENING BILATERAL MAMMOGRAM WITH TOMOSYNTHESIS AND CAD
TECHNIQUE: Bilateral screening digital craniocaudal and mediolateral oblique
mammograms were obtained. Bilateral screening digital breast
tomosynthesis was performed. The images were evaluated with
computer-aided detection.

[R CC synth-2D]
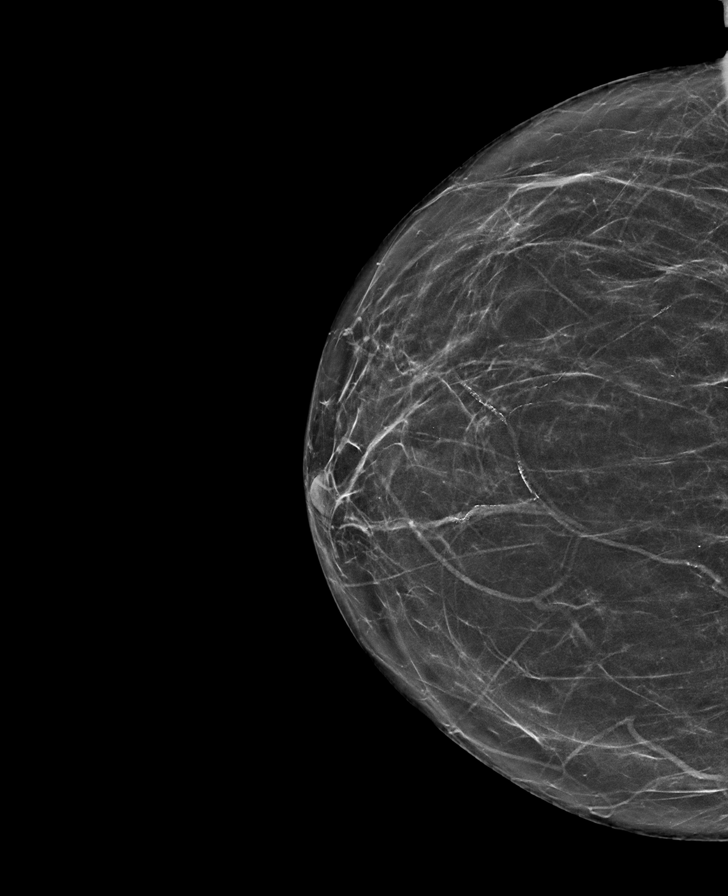

[R MLO synth-2D]
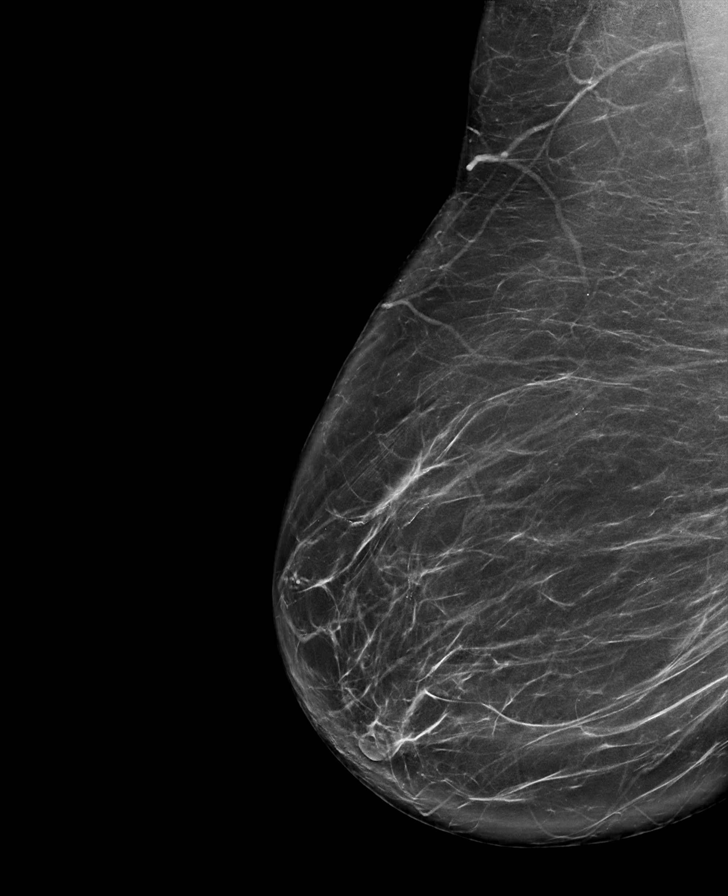

[L CC synth-2D]
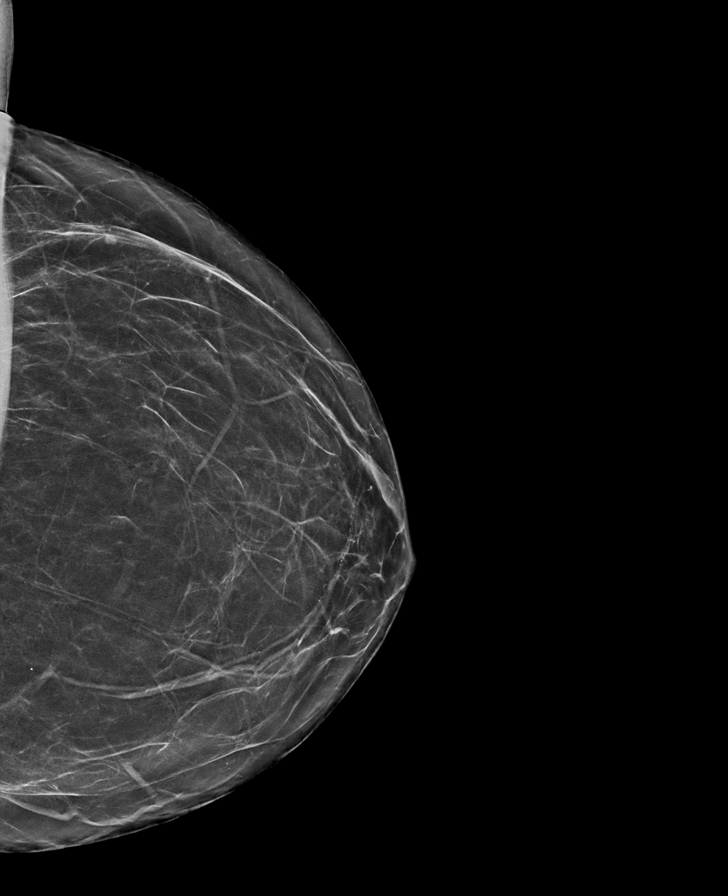

[L MLO synth-2D]
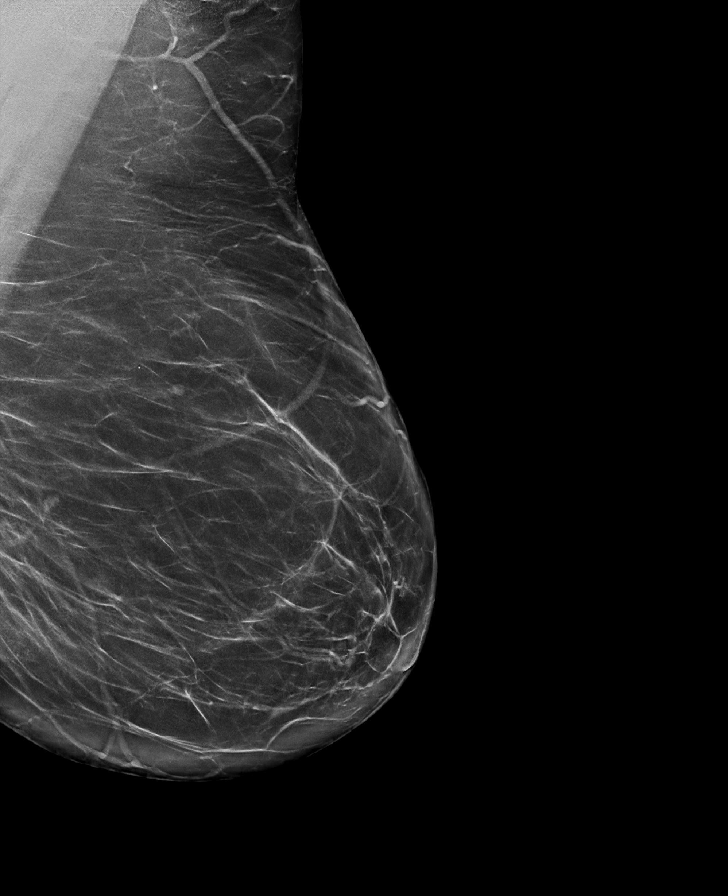

[L MLO tomo · tomo slice 45/88.0]
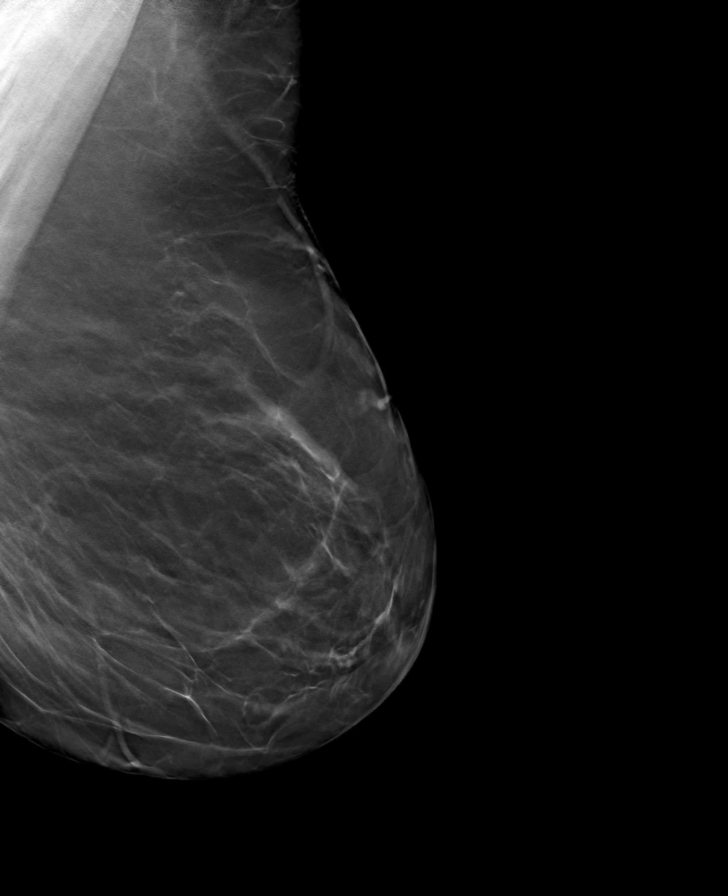

[L CC tomo · tomo slice 33/65.0]
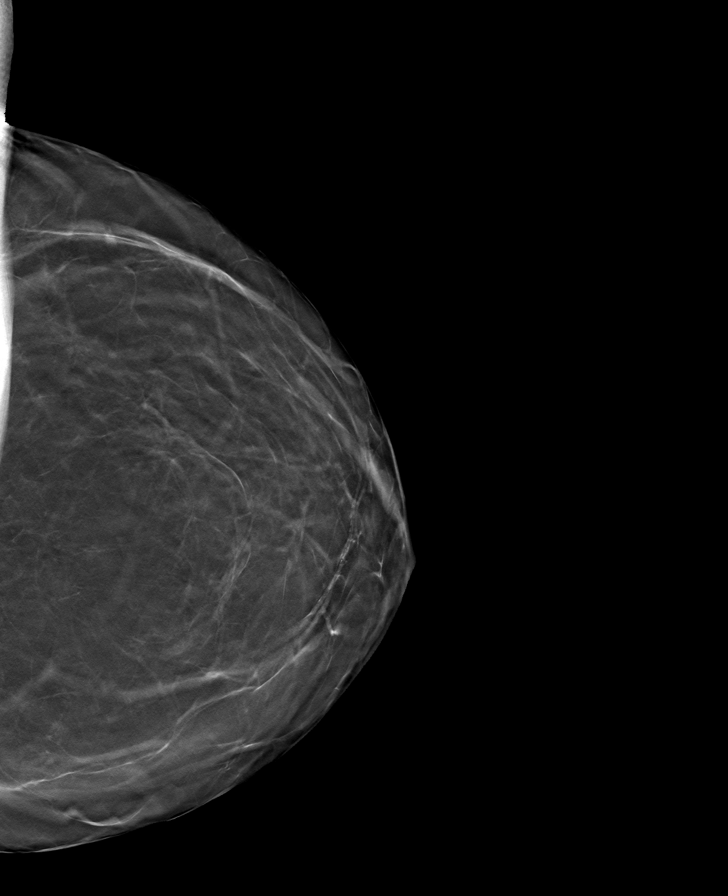

[R CC tomo · tomo slice 33/65.0]
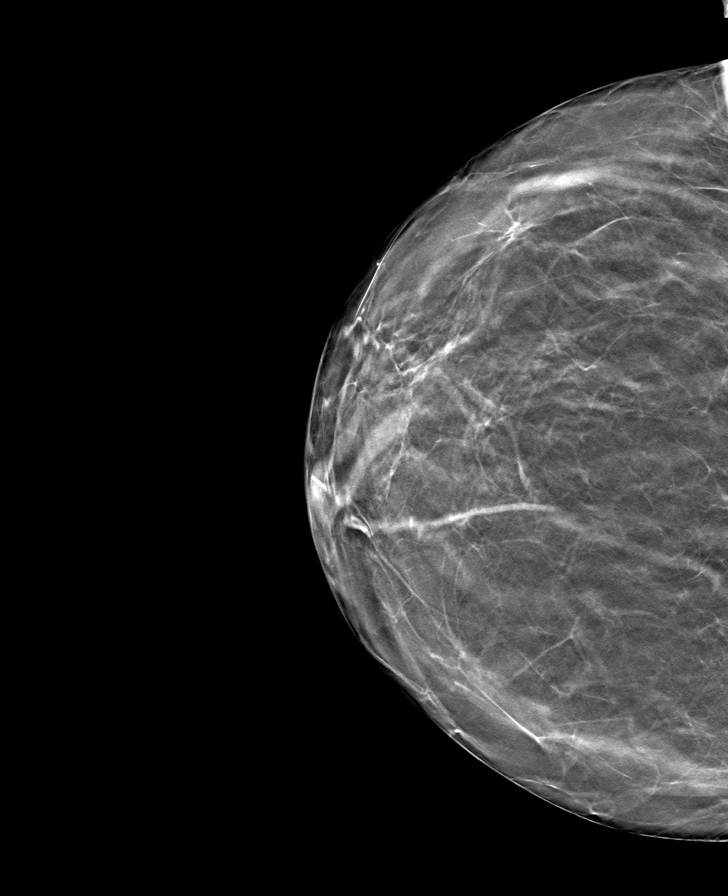

[R MLO tomo · tomo slice 43/86.0]
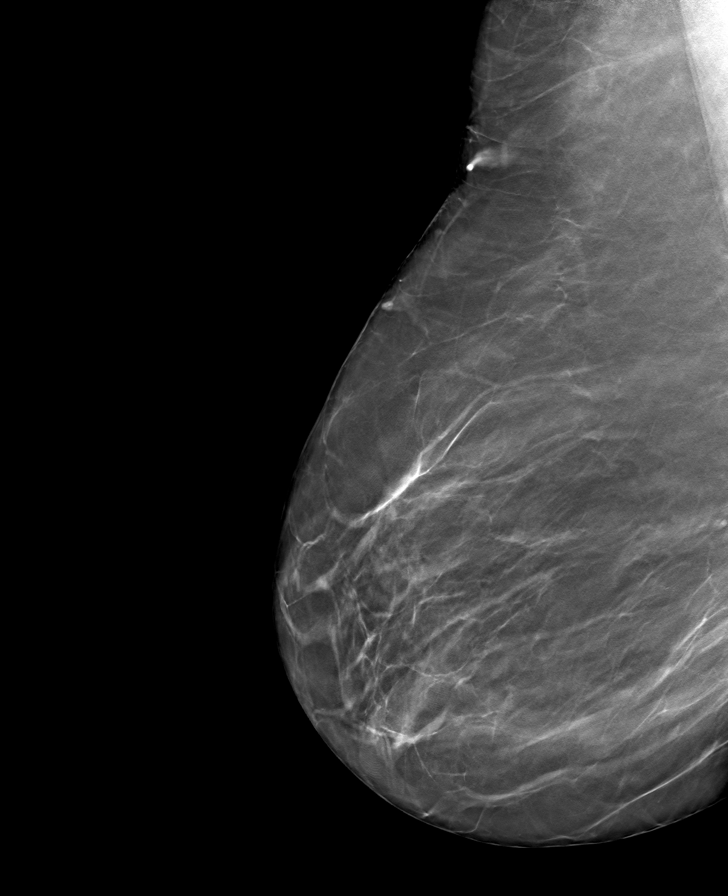

[8 of 24 positions shown; findings below may reference images not displayed]

ACR Breast Density Category b: There are scattered areas of
fibroglandular density.
FINDINGS: There are no findings suspicious for malignancy.
IMPRESSION: No mammographic evidence of malignancy. A result letter of this
screening mammogram will be mailed directly to the patient.

RECOMMENDATION:
Screening mammogram in one year. (Code:51-O-LD2)

BI-RADS CATEGORY  1: Negative.

## 2023-03-16 ENCOUNTER — Ambulatory Visit
Admission: RE | Admit: 2023-03-16 | Discharge: 2023-03-16 | Disposition: A | Discharge status: Home / Self Care | Payer: No Typology Code available for payment source | Source: Ambulatory Visit | Attending: Family Medicine | Admitting: Family Medicine

## 2023-03-16 ENCOUNTER — Other Ambulatory Visit: Payer: Self-pay | Admitting: Family Medicine

## 2023-03-16 DIAGNOSIS — Z1231 Encounter for screening mammogram for malignant neoplasm of breast: Secondary | ICD-10-CM | POA: Diagnosis not present

## 2023-03-20 ENCOUNTER — Encounter: Payer: Self-pay | Admitting: Family Medicine

## 2023-03-22 ENCOUNTER — Other Ambulatory Visit: Payer: Self-pay | Admitting: Family Medicine

## 2023-03-25 ENCOUNTER — Telehealth: Payer: Self-pay

## 2023-03-25 NOTE — Telephone Encounter (Unsigned)
Copied from CRM [id number]. Topic: Clinical - Medication Question >> Mar 25, 2023 11:18 AM Donita Brooks wrote: Reason for CRM: pt insurance dosen't cover medication- Vibegron (GEMTESA) 75 MG TABS pt would like to know if they is alternative for the medication that her insurance accepts.

## 2023-03-27 ENCOUNTER — Encounter: Payer: Self-pay | Admitting: Family Medicine

## 2023-03-27 ENCOUNTER — Other Ambulatory Visit: Payer: Self-pay | Admitting: Family Medicine

## 2023-03-27 MED ORDER — MIRABEGRON ER 25 MG PO TB24: 25.0000 mg | ORAL_TABLET | Freq: Every day | ORAL | 2 refills | Status: DC

## 2023-03-31 ENCOUNTER — Other Ambulatory Visit: Payer: Self-pay

## 2023-03-31 MED ORDER — MIRABEGRON ER 25 MG PO TB24: 25.0000 mg | ORAL_TABLET | Freq: Every day | ORAL | 2 refills | Status: DC

## 2023-04-12 ENCOUNTER — Other Ambulatory Visit: Payer: Self-pay | Admitting: Family Medicine

## 2023-04-19 ENCOUNTER — Ambulatory Visit: Payer: No Typology Code available for payment source | Admitting: *Deleted

## 2023-04-19 DIAGNOSIS — Z0000 Encounter for general adult medical examination without abnormal findings: Secondary | ICD-10-CM | POA: Diagnosis not present

## 2023-04-19 NOTE — Patient Instructions (Signed)
 Ms. Christy Crawford , Thank you for taking time to come for your Medicare Wellness Visit. I appreciate your ongoing commitment to your health goals. Please review the following plan we discussed and let me know if I can assist you in the future.   Screening recommendations/referrals: Colonoscopy: up to date Mammogram: up to date Bone Density: Education provided Recommended yearly ophthalmology/optometry visit for glaucoma screening and checkup Recommended yearly dental visit for hygiene and checkup  Vaccinations: Influenza vaccine: Education provided Pneumococcal vaccine: up to date Tdap vaccine: up to date      Preventive Care 65 Years and Older, Female Preventive care refers to lifestyle choices and visits with your health care provider that can promote health and wellness. What does preventive care include? A yearly physical exam. This is also called an annual well check. Dental exams once or twice a year. Routine eye exams. Ask your health care provider how often you should have your eyes checked. Personal lifestyle choices, including: Daily care of your teeth and gums. Regular physical activity. Eating a healthy diet. Avoiding tobacco and drug use. Limiting alcohol use. Practicing safe sex. Taking low-dose aspirin every day. Taking vitamin and mineral supplements as recommended by your health care provider. What happens during an annual well check? The services and screenings done by your health care provider during your annual well check will depend on your age, overall health, lifestyle risk factors, and family history of disease. Counseling  Your health care provider may ask you questions about your: Alcohol use. Tobacco use. Drug use. Emotional well-being. Home and relationship well-being. Sexual activity. Eating habits. History of falls. Memory and ability to understand (cognition). Work and work astronomer. Reproductive health. Screening  You may have the following  tests or measurements: Height, weight, and BMI. Blood pressure. Lipid and cholesterol levels. These may be checked every 5 years, or more frequently if you are over 80 years old. Skin check. Lung cancer screening. You may have this screening every year starting at age 51 if you have a 30-pack-year history of smoking and currently smoke or have quit within the past 15 years. Fecal occult blood test (FOBT) of the stool. You may have this test every year starting at age 46. Flexible sigmoidoscopy or colonoscopy. You may have a sigmoidoscopy every 5 years or a colonoscopy every 10 years starting at age 60. Hepatitis C blood test. Hepatitis B blood test. Sexually transmitted disease (STD) testing. Diabetes screening. This is done by checking your blood sugar (glucose) after you have not eaten for a while (fasting). You may have this done every 1-3 years. Bone density scan. This is done to screen for osteoporosis. You may have this done starting at age 46. Mammogram. This may be done every 1-2 years. Talk to your health care provider about how often you should have regular mammograms. Talk with your health care provider about your test results, treatment options, and if necessary, the need for more tests. Vaccines  Your health care provider may recommend certain vaccines, such as: Influenza vaccine. This is recommended every year. Tetanus, diphtheria, and acellular pertussis (Tdap, Td) vaccine. You may need a Td booster every 10 years. Zoster vaccine. You may need this after age 34. Pneumococcal 13-valent conjugate (PCV13) vaccine. One dose is recommended after age 59. Pneumococcal polysaccharide (PPSV23) vaccine. One dose is recommended after age 76. Talk to your health care provider about which screenings and vaccines you need and how often you need them. This information is not intended to replace advice  given to you by your health care provider. Make sure you discuss any questions you have with  your health care provider. Document Released: 04/18/2015 Document Revised: 12/10/2015 Document Reviewed: 01/21/2015 Elsevier Interactive Patient Education  2017 [employer].  Fall Prevention in the Home Falls can cause injuries. They can happen to people of all ages. There are many things you can do to make your home safe and to help prevent falls. What can I do on the outside of my home? Regularly fix the edges of walkways and driveways and fix any cracks. Remove anything that might make you trip as you walk through a door, such as a raised step or threshold. Trim any bushes or trees on the path to your home. Use bright outdoor lighting. Clear any walking paths of anything that might make someone trip, such as rocks or tools. Regularly check to see if handrails are loose or broken. Make sure that both sides of any steps have handrails. Any raised decks and porches should have guardrails on the edges. Have any leaves, snow, or ice cleared regularly. Use sand or salt on walking paths during winter. Clean up any spills in your garage right away. This includes oil or grease spills. What can I do in the bathroom? Use night lights. Install grab bars by the toilet and in the tub and shower. Do not use towel bars as grab bars. Use non-skid mats or decals in the tub or shower. If you need to sit down in the shower, use a plastic, non-slip stool. Keep the floor dry. Clean up any water that spills on the floor as soon as it happens. Remove soap buildup in the tub or shower regularly. Attach bath mats securely with double-sided non-slip rug tape. Do not have throw rugs and other things on the floor that can make you trip. What can I do in the bedroom? Use night lights. Make sure that you have a light by your bed that is easy to reach. Do not use any sheets or blankets that are too big for your bed. They should not hang down onto the floor. Have a firm chair that has side arms. You can use this  for support while you get dressed. Do not have throw rugs and other things on the floor that can make you trip. What can I do in the kitchen? Clean up any spills right away. Avoid walking on wet floors. Keep items that you use a lot in easy-to-reach places. If you need to reach something above you, use a strong step stool that has a grab bar. Keep electrical cords out of the way. Do not use floor polish or wax that makes floors slippery. If you must use wax, use non-skid floor wax. Do not have throw rugs and other things on the floor that can make you trip. What can I do with my stairs? Do not leave any items on the stairs. Make sure that there are handrails on both sides of the stairs and use them. Fix handrails that are broken or loose. Make sure that handrails are as long as the stairways. Check any carpeting to make sure that it is firmly attached to the stairs. Fix any carpet that is loose or worn. Avoid having throw rugs at the top or bottom of the stairs. If you do have throw rugs, attach them to the floor with carpet tape. Make sure that you have a light switch at the top of the stairs and the bottom of  the stairs. If you do not have them, ask someone to add them for you. What else can I do to help prevent falls? Wear shoes that: Do not have high heels. Have rubber bottoms. Are comfortable and fit you well. Are closed at the toe. Do not wear sandals. If you use a stepladder: Make sure that it is fully opened. Do not climb a closed stepladder. Make sure that both sides of the stepladder are locked into place. Ask someone to hold it for you, if possible. Clearly mark and make sure that you can see: Any grab bars or handrails. First and last steps. Where the edge of each step is. Use tools that help you move around (mobility aids) if they are needed. These include: Canes. Walkers. Scooters. Crutches. Turn on the lights when you go into a dark area. Replace any light bulbs as  soon as they burn out. Set up your furniture so you have a clear path. Avoid moving your furniture around. If any of your floors are uneven, fix them. If there are any pets around you, be aware of where they are. Review your medicines with your doctor. Some medicines can make you feel dizzy. This can increase your chance of falling. Ask your doctor what other things that you can do to help prevent falls. This information is not intended to replace advice given to you by your health care provider. Make sure you discuss any questions you have with your health care provider. Document Released: 01/16/2009 Document Revised: 08/28/2015 Document Reviewed: 04/26/2014 Elsevier Interactive Patient Education  2017 [employer].

## 2023-04-19 NOTE — Progress Notes (Signed)
 Subjective:   Christy Crawford is a 68 y.o. female who presents for Medicare Annual (Subsequent) preventive examination.  Visit Complete: Virtual I connected with  [name] on 04/19/23 by a audio enabled telemedicine application and verified that I am speaking with the correct person using two identifiers.  . Unable to connect to video  Patient Location: Home  Provider Location: Home Office  I discussed the limitations of evaluation and management by telemedicine. The patient expressed understanding and agreed to proceed.  Vital Signs: Because this visit was a virtual/telehealth visit, some criteria may be missing or patient reported. Any vitals not documented were not able to be obtained and vitals that have been documented are patient reported.  Cardiac Risk Factors include: advanced age (>17men, >78 women);diabetes mellitus;hypertension;obesity (BMI >30kg/m2);sedentary lifestyle     Objective:    Today's Vitals   04/19/23 1312  PainSc: 7    There is no height or weight on file to calculate BMI.     12/16/2020    3:50 PM 07/09/2019    4:37 PM  Advanced Directives  Does Patient Have a Medical Advance Directive? No Yes  Type of Advance Directive  Living will  Would patient like information on creating a medical advance directive? Yes (MAU/Ambulatory/Procedural Areas - Information given)     Current Medications (verified) Outpatient Encounter Medications as of 04/19/2023  Medication Sig   acetaminophen  (TYLENOL ) 325 MG tablet Take 325 mg by mouth every 6 (six) hours as needed.   ALPRAZolam (XANAX) 1 MG tablet Take 1 mg by mouth in the morning, at noon, in the evening, and at bedtime.    amitriptyline  (ELAVIL ) 25 MG tablet TAKE ONE TABLET BY MOUTH EVERYDAY AT BEDTIME   COMFORT EZ PEN NEEDLES 31G X 5 MM MISC E11.44 use new needle with each new injection   Dulaglutide  (TRULICITY ) 3 MG/0.5ML SOPN Inject 3 mg into the skin once a week.   finasteride  (PROSCAR ) 5 MG tablet TAKE  1 TABLET BY MOUTH DAILY   furosemide  (LASIX ) 20 MG tablet Take 1 tablet (20 mg total) by mouth 3 (three) times a week.   Glucagon  (GVOKE HYPOPEN  2-PACK) 0.5 MG/0.1ML SOAJ INJECT 0.5MG  SUBCUTANEOUSLY DAILY AS NEEDED FOR HYPOGLYCEMIA   insulin degludec  (TRESIBA  FLEXTOUCH) 100 UNIT/ML FlexTouch Pen INJECT 65 UNITS SUBCUTANEOUSLY DAILY   Lancets (ONETOUCH DELICA PLUS LANCET33G) MISC USE TO check blood glucose three times daily AS DIRECTED   lisinopril  (ZESTRIL ) 40 MG tablet TAKE 1 TABLET BY MOUTH TWICE DAILY AT BREAKFAST AND EVERY NIGHT AT BEDTIME   montelukast  (SINGULAIR ) 10 MG tablet TAKE ONE TABLET BY MOUTH EVERYDAY AT BEDTIME   NOVOFINE PEN NEEDLE 32G X 6 MM MISC USE AS DIRECTED   NOVOFINE PLUS PEN NEEDLE 32G X 4 MM MISC    ONETOUCH VERIO test strip USE TO check blood glucose three times daily AS DIRECTED   pregabalin  (LYRICA ) 150 MG capsule Take 1 capsule (150 mg total) by mouth 2 (two) times daily.   rosuvastatin  (CRESTOR ) 10 MG tablet TAKE ONE TABLET BY MOUTH ONCE DAILY   sertraline  (ZOLOFT ) 100 MG tablet Take 1 tablet (100 mg total) by mouth 2 (two) times daily. (Patient taking differently: Take 200 mg by mouth daily.)   Vibegron  (GEMTESA ) 75 MG TABS Take by mouth.   Vitamin D , Cholecalciferol, 25 MCG (1000 UT) CAPS Take 2,000 Int'l Units by mouth.   mirabegron  ER (MYRBETRIQ ) 25 MG TB24 tablet Take 1 tablet (25 mg total) by mouth daily. (Patient not taking:  Reported on 04/19/2023)   Multiple Vitamins-Minerals (CENTRUM SILVER PO) Take 1 tablet by mouth daily. (Patient not taking: Reported on 04/19/2023)   Selenium  Sulfide 2.25 % SHAM Apply 1 Application topically every 3 (three) days. (Patient not taking: Reported on 04/19/2023)   No facility-administered encounter medications on file as of 04/19/2023.    Allergies (verified) Patient has no known allergies.   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   lens implants Bilateral    Family History  Adopted: Yes  Problem Relation Age of Onset   Diabetes Maternal Aunt    Social History   Socioeconomic History   Marital status: Married    Spouse name: Not on file   Number of children: 1   Years of education: Not on file   Highest education level: Not on file  Occupational History   Occupation: disabled  Tobacco Use   Smoking status: Former    Current packs/day: 0.00    Types: Cigarettes    Quit date: 2000    Years since quitting: 25.0   Smokeless tobacco: Never  Vaping Use   Vaping status: Never Used  Substance and Sexual Activity   Alcohol use: Yes    Alcohol/week: 1.0 standard drink of alcohol    Types: 1 Glasses of wine per week    Comment: socially   Drug use: Never   Sexual activity: Not on file  Other Topics Concern   Not on file  Social History Narrative   Not on file   Social Drivers of Health   Financial Resource Strain: Low Risk  (04/19/2023)   Overall Financial Resource Strain (CARDIA)    Difficulty of Paying Living Expenses: Not hard at all  Food Insecurity: No Food Insecurity (04/19/2023)   Hunger Vital Sign    Worried About Running Out of Food in the Last Year: Never true    Ran Out of Food in the Last Year: Never true  Transportation Needs: No Transportation Needs (04/19/2023)   PRAPARE - Administrator, Civil Service (Medical): No    Lack of Transportation (Non-Medical): No  Physical Activity: Inactive (04/19/2023)   Exercise Vital  Sign    Days of Exercise per Week: 0 days    Minutes of Exercise per Session: 0 min  Stress: No Stress Concern Present (04/19/2023)   [employer] of Occupational Health - Occupational Stress Questionnaire    Feeling of Stress : Not at all  Social Connections: Moderately Isolated (04/19/2023)   Social Connection and Isolation Panel [NHANES]    Frequency of Communication with Friends and Family: More than three times a week    Frequency of Social Gatherings with Friends and Family: More than three times a week    Attends Religious Services: Never    Database Administrator or Organizations: No    Attends Banker Meetings: Never  Marital Status: Married    Tobacco Counseling Counseling given: Not Answered   Clinical Intake:  Pre-visit preparation completed: Yes  Pain : 0-10 Pain Score: 7  Pain Type: Chronic pain Pain Descriptors / Indicators: Constant, Burning, Aching, Dull Pain Onset: More than a month ago Pain Frequency: Constant Effect of Pain on Daily Activities: yes     Diabetes: Yes CBG done?: No Did pt. bring in CBG monitor from home?: No  How often do you need to have someone help you when you read instructions, pamphlets, or other written materials from your doctor or pharmacy?: 1 - Never  Interpreter Needed?: No  Information entered by :: Mliss Graff LPN   Activities of Daily Living    04/19/2023    1:40 PM  In your present state of health, do you have any difficulty performing the following activities:  Hearing? 1  Vision? 0  Difficulty concentrating or making decisions? 0  Walking or climbing stairs? 1  Dressing or bathing? 1  Doing errands, shopping? 1  Preparing Food and eating ? N  Using the Toilet? N  In the past six months, have you accidently leaked urine? N  Do you have problems with loss of bowel control? N  Managing your Medications? Y  Managing your Finances? Y  Housekeeping or managing your Housekeeping? Y     Patient Care Team: Sherre Clapper, MD as PCP - General (Family Medicine) Onita Duos, MD as Consulting Physician (Neurology) Vincente Grip, MD as Consulting Physician (Psychiatry) Erasmo Bernardino BRAVO, OD (Optometry)  Indicate any recent Medical Services you may have received from other than [facility] providers in the past year (date may be approximate).     Assessment:   This is a routine wellness examination for Mallard.  Hearing/Vision screen Hearing Screening - Comments:: Some trouble hearing Vision Screening - Comments:: Unsure of name   Goals Addressed             This Visit's Progress    Patient Stated       No goals at this time       Depression Screen    04/19/2023    1:16 PM 02/04/2023   11:03 AM 10/28/2022    3:03 PM 05/18/2022    1:38 PM 04/13/2022    1:30 PM 09/24/2021    3:17 PM 06/15/2021    2:05 PM  PHQ 2/9 Scores  PHQ - 2 Score 3 0 1 0 1 0 0  PHQ- 9 Score 8 2 6 7  1 1     Fall Risk    04/19/2023    1:29 PM 10/28/2022    3:03 PM 05/18/2022    1:38 PM 04/13/2022    1:18 PM 12/28/2021    1:40 PM  Fall Risk   Falls in the past year? 0 1 1 0 1  Number falls in past yr: 0 1 1 0 0  Injury with Fall? 0 1 0 0 0  Risk for fall due to :  History of fall(s) Impaired balance/gait  Impaired mobility  Follow up Falls evaluation completed;Education provided;Falls prevention discussed Falls evaluation completed;Falls prevention discussed Falls evaluation completed;Education provided  Falls evaluation completed    MEDICARE RISK AT HOME: Medicare Risk at Home Any stairs in or around the home?: No If so, are there any without handrails?: No Home free of loose throw rugs in walkways, pet beds, electrical cords, etc?: Yes Adequate lighting in your home to reduce risk of falls?: Yes Life alert?: No Use of  a cane, walker or w/c?: Yes Grab bars in the bathroom?: Yes Shower chair or bench in shower?: Yes Elevated toilet seat or a handicapped toilet?: Yes  TIMED UP AND GO:  Was  the test performed?  No    Cognitive Function:        04/19/2023    1:15 PM 12/16/2020    3:54 PM  6CIT Screen  What Year? 0 points 0 points  What month? 0 points 0 points  What time? 0 points 0 points  Count back from 20 0 points 0 points  Months in reverse 0 points 2 points  Repeat phrase 0 points 0 points  Total Score 0 points 2 points    Immunizations Immunization History  Administered Date(s) Administered   Fluad Quad(high Dose 65+) 01/01/2020, 12/02/2020, 12/28/2021   Influenza-Unspecified 08/25/2017, 02/20/2018, 02/12/2019   PNEUMOCOCCAL CONJUGATE-20 03/09/2021   Pneumococcal Polysaccharide-23 01/01/2020   Tdap 12/28/2021    TDAP status: Up to date  Flu Vaccine status: Due, Education has been provided regarding the importance of this vaccine. Advised may receive this vaccine at local pharmacy or Health Dept. Aware to provide a copy of the vaccination record if obtained from local pharmacy or Health Dept. Verbalized acceptance and understanding.  Pneumococcal vaccine status: Up to date  Covid-19 vaccine status: Information provided on how to obtain vaccines.   Qualifies for Shingles Vaccine? Yes   Zostavax completed No   Shingrix Completed?: No.    Education has been provided regarding the importance of this vaccine. Patient has been advised to call insurance company to determine out of pocket expense if they have not yet received this vaccine. Advised may also receive vaccine at local pharmacy or Health Dept. Verbalized acceptance and understanding.  Screening Tests Health Maintenance  Topic Date Due   Hepatitis C Screening  Never done   OPHTHALMOLOGY EXAM  12/05/2022   COVID-19 Vaccine (1) 04/30/2023 (Originally 08/13/1960)   INFLUENZA VACCINE  07/04/2023 (Originally 11/04/2022)   Zoster Vaccines- Shingrix (1 of 2) 07/18/2023 (Originally 08/12/1974)   DEXA SCAN  02/04/2024 (Originally 08/13/2019)   FOOT EXAM  05/19/2023   HEMOGLOBIN A1C  08/04/2023   Fecal DNA  (Cologuard)  12/18/2023   Diabetic kidney evaluation - eGFR measurement  02/04/2024   Diabetic kidney evaluation - Urine ACR  02/04/2024   MAMMOGRAM  03/15/2024   Medicare Annual Wellness (AWV)  04/18/2024   DTaP/Tdap/Td (2 - Td or Tdap) 12/29/2031   Pneumonia Vaccine 66+ Years old  Completed   HPV VACCINES  Aged Out    Health Maintenance  Health Maintenance Due  Topic Date Due   Hepatitis C Screening  Never done   OPHTHALMOLOGY EXAM  12/05/2022  Bone Density   Education provided  Colorectal cancer screening: Type of screening: Cologuard. Completed 2022. Repeat every 3 years  Mammogram status: Completed  . Repeat every year    Lung Cancer Screening: (Low Dose CT Chest recommended if Age 16-80 years, 20 pack-year currently smoking OR have quit w/in 15years.) does not qualify.   Lung Cancer Screening Referral:   Additional Screening:  Hepatitis C Screening:   never done  Vision Screening: Recommended annual ophthalmology exams for early detection of glaucoma and other disorders of the eye. Is the patient up to date with their annual eye exam?  No  Who is the provider or what is the name of the office in which the patient attends annual eye exams?  If pt is not established with a provider, would they like  to be referred to a provider to establish care? No .   Dental Screening: Recommended annual dental exams for proper oral hygiene  Nutrition Risk Assessment:  Has the patient had any N/V/D within the last 2 months?  No  Does the patient have any non-healing wounds?  No  Has the patient had any unintentional weight loss or weight gain?  No   Diabetes:  Is the patient diabetic?  Yes  If diabetic, was a CBG obtained today?  No  Did the patient bring in their glucometer from home?  No  How often do you monitor your CBG's? 3 x a day.   Financial Strains and Diabetes Management:  Are you having any financial strains with the device, your supplies or your medication? No .   Does the patient want to be seen by Chronic Care Management for management of their diabetes?  No  Would the patient like to be referred to a Nutritionist or for Diabetic Management?  No   Diabetic Exams:  Diabetic Eye Exam: .  Pt has been advised about the importance in completing this exam.   Diabetic Foot Exam: . Pt has been advised about the importance in completing this exam..    Community Resource Referral / Chronic Care Management: CRR required this visit?  No   CCM required this visit?  No     Plan:     I have personally reviewed and noted the following in the patient's chart:   Medical and social history Use of alcohol, tobacco or illicit drugs  Current medications and supplements including opioid prescriptions. Patient is not currently taking opioid prescriptions. Functional ability and status Nutritional status Physical activity Advanced directives List of other physicians Hospitalizations, surgeries, and ER visits in previous 12 months Vitals Screenings to include cognitive, depression, and falls Referrals and appointments  In addition, I have reviewed and discussed with patient certain preventive protocols, quality metrics, and best practice recommendations. A written personalized care plan for preventive services as well as general preventive health recommendations were provided to patient.     Mliss Graff, LPN   [id number]/[id number]   After Visit Summary: (MyChart) Due to this being a telephonic visit, the after visit summary with patients personalized plan was offered to patient via MyChart   Nurse Notes:

## 2023-04-22 ENCOUNTER — Other Ambulatory Visit: Payer: Self-pay

## 2023-05-11 ENCOUNTER — Encounter: Payer: No Typology Code available for payment source | Admitting: Family Medicine

## 2023-05-11 NOTE — Progress Notes (Deleted)
 Subjective:  Patient ID: Christy Crawford, female    DOB: 1956-03-14  Age: 68 y.o. MRN: [id number]  Chief Complaint  Patient presents with   Medical Management of Chronic Issues    HPI   Diabetes:  Complications: Diabetic macular edema. Amyotropy/neuropathy. Glucose checking: Patient checks sugars three times daily. Glucose logs: lowest 70s and highest 165 Hypoglycemia: a few readings in the 70's, but patient does not have symptoms or feel like its low.  Most recent A1C: 7.2 Current medications: Patient is  still currently taking Trulicity  3 mg once weekly. Also taking Tresiba  65 units qhs, Lyrica  300 mg one tablet twice daily. Last Eye Exam. 12/04/21. Seeing every 6 months. Foot checks: Patient checks her feet daily.  Hyperlipidemia: Current medications: Patient is currently taking Rosuvastatin  10 mg take 1 tablet daily.  Hypertension: Current medications: Patient is currently taking Lisinopril  40 mg twice daily.  Depression: Patient is currently taking Zoloft  100 mg twice daily. On xanax 1 mg oral three times daily.   Sees Dr Trinidad Aurora every 6 months.   Urge incontinence: on gemtesa  75 mg daily. Doing very well.  Diet: eating very healthy Exercise: Patient is unable to exercise due to Amyotrophy/Neuropathy.     04/19/2023    1:16 PM 02/04/2023   11:03 AM 10/28/2022    3:03 PM 05/18/2022    1:38 PM 04/13/2022    1:30 PM  Depression screen PHQ 2/9  Decreased Interest 1 0 1 0 1  Down, Depressed, Hopeless 2 0 0 0 0  PHQ - 2 Score 3 0 1 0 1  Altered sleeping 2 1 2 1    Tired, decreased energy  1 1 3    Change in appetite 1 0 2 1   Feeling bad or failure about yourself  0 0 0 1   Trouble concentrating  0 0 0   Moving slowly or fidgety/restless 2 0 0 1   Suicidal thoughts 0 0 0 0   PHQ-9 Score 8 2 6 7    Difficult doing work/chores Not difficult at all Not difficult at all Somewhat difficult Somewhat difficult         04/19/2023    1:29 PM  Fall Risk   Falls in the past  year? 0  Number falls in past yr: 0  Injury with Fall? 0  Follow up Falls evaluation completed;Education provided;Falls prevention discussed    Patient Care Team: Sherre Clapper, MD as PCP - General (Family Medicine) Onita Duos, MD as Consulting Physician (Neurology) Aurora Grip, MD as Consulting Physician (Psychiatry) Erasmo Bernardino BRAVO, OD (Optometry)   Review of Systems  Current Outpatient Medications on File Prior to Visit  Medication Sig Dispense Refill   acetaminophen  (TYLENOL ) 325 MG tablet Take 325 mg by mouth every 6 (six) hours as needed.     ALPRAZolam (XANAX) 1 MG tablet Take 1 mg by mouth in the morning, at noon, in the evening, and at bedtime.      amitriptyline  (ELAVIL ) 25 MG tablet TAKE 1 TABLET BY MOUTH EVERY DAY AT BEDTIME 30 tablet 1   COMFORT EZ PEN NEEDLES 31G X 5 MM MISC E11.44 use new needle with each new injection 100 each 2   Dulaglutide  (TRULICITY ) 3 MG/0.5ML SOPN Inject 3 mg into the skin once a week. 6 mL 1   finasteride  (PROSCAR ) 5 MG tablet TAKE 1 TABLET BY MOUTH DAILY 30 tablet 10   furosemide  (LASIX ) 20 MG tablet Take 1 tablet (20 mg total) by mouth 3 (  three) times a week.     Glucagon  (GVOKE HYPOPEN  2-PACK) 0.5 MG/0.1ML SOAJ INJECT 0.5MG  SUBCUTANEOUSLY DAILY AS NEEDED FOR HYPOGLYCEMIA 0.2 mL 2   insulin degludec  (TRESIBA  FLEXTOUCH) 100 UNIT/ML FlexTouch Pen INJECT 65 UNITS SUBCUTANEOUSLY DAILY 15 mL 2   Lancets (ONETOUCH DELICA PLUS LANCET33G) MISC USE TO check blood glucose three times daily AS DIRECTED 100 each 12   lisinopril  (ZESTRIL ) 40 MG tablet TAKE 1 TABLET BY MOUTH TWICE DAILY AT BREAKFAST AND EVERY NIGHT AT BEDTIME 60 tablet 2   mirabegron  ER (MYRBETRIQ ) 25 MG TB24 tablet Take 1 tablet (25 mg total) by mouth daily. (Patient not taking: Reported on 04/19/2023) 30 tablet 2   montelukast  (SINGULAIR ) 10 MG tablet TAKE 1 TABLET BY MOUTH EVERY DAY AT BEDTIME 30 tablet 10   Multiple Vitamins-Minerals (CENTRUM SILVER PO) Take 1 tablet by mouth daily.  (Patient not taking: Reported on 04/19/2023)     NOVOFINE PEN NEEDLE 32G X 6 MM MISC USE AS DIRECTED 100 each 10   NOVOFINE PLUS PEN NEEDLE 32G X 4 MM MISC      ONETOUCH VERIO test strip USE TO check blood glucose three times daily AS DIRECTED 100 strip 12   pregabalin  (LYRICA ) 150 MG capsule Take 1 capsule (150 mg total) by mouth 2 (two) times daily. 180 capsule 1   pregabalin  (LYRICA ) 300 MG capsule TAKE 1 CAPSULE BY MOUTH EVERY EVENING 30 capsule 1   rosuvastatin  (CRESTOR ) 10 MG tablet TAKE 1 TABLET BY MOUTH ONCE DAILY 30 tablet 10   Selenium  Sulfide 2.25 % SHAM Apply 1 Application topically every 3 (three) days. (Patient not taking: Reported on 04/19/2023) 180 mL 3   sertraline  (ZOLOFT ) 100 MG tablet Take 1 tablet (100 mg total) by mouth 2 (two) times daily. (Patient taking differently: Take 200 mg by mouth daily.) 180 tablet 1   Vibegron  (GEMTESA ) 75 MG TABS Take by mouth.     Vitamin D , Cholecalciferol, 25 MCG (1000 UT) CAPS Take 2,000 Int'l Units by mouth.     No current facility-administered medications on file prior to visit.   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   lens implants Bilateral     Family History  Adopted: Yes  Problem Relation Age of Onset   Diabetes Maternal Aunt    Social History   Socioeconomic History   Marital status: Married    Spouse name: Not on file   Number of children: 1   Years of education: Not on file    Highest education level: Not on file  Occupational History   Occupation: disabled  Tobacco Use   Smoking status: Former    Current packs/day: 0.00    Types: Cigarettes    Quit date: 2000    Years since quitting: 25.1   Smokeless tobacco: Never  Vaping Use   Vaping status: Never Used  Substance and Sexual  Activity   Alcohol use: Yes    Alcohol/week: 1.0 standard drink of alcohol    Types: 1 Glasses of wine per week    Comment: socially   Drug use: Never   Sexual activity: Not on file  Other Topics Concern   Not on file  Social History Narrative   Not on file   Social Drivers of Health   Financial Resource Strain: Low Risk  (04/19/2023)   Overall Financial Resource Strain (CARDIA)    Difficulty of Paying Living Expenses: Not hard at all  Food Insecurity: No Food Insecurity (04/19/2023)   Hunger Vital Sign    Worried About Running Out of Food in the Last Year: Never true    Ran Out of Food in the Last Year: Never true  Transportation Needs: No Transportation Needs (04/19/2023)   PRAPARE - Administrator, Civil Service (Medical): No    Lack of Transportation (Non-Medical): No  Physical Activity: Inactive (04/19/2023)   Exercise Vital Sign    Days of Exercise per Week: 0 days    Minutes of Exercise per Session: 0 min  Stress: No Stress Concern Present (04/19/2023)   [employer] of Occupational Health - Occupational Stress Questionnaire    Feeling of Stress : Not at all  Social Connections: Moderately Isolated (04/19/2023)   Social Connection and Isolation Panel [NHANES]    Frequency of Communication with Friends and Family: More than three times a week    Frequency of Social Gatherings with Friends and Family: More than three times a week    Attends Religious Services: Never    Database Administrator or Organizations: No    Attends Banker Meetings: Never    Marital Status: Married    Objective:  There were no vitals taken for this  visit.     02/04/2023   11:00 AM 10/28/2022    3:09 PM 10/28/2022    2:55 PM  BP/Weight  Systolic BP 136 142 154  Diastolic BP 80 84 100  Wt. (Lbs) 325 320   BMI 41.95 kg/m2 43.4 kg/m2     Physical Exam  Diabetic Foot Exam - Simple   No data filed      Lab Results  Component Value Date   WBC 7.0 02/04/2023   HGB 13.9 02/04/2023   HCT 43.8 02/04/2023   PLT 194 02/04/2023   GLUCOSE 125 (H) 02/04/2023   CHOL 141 02/04/2023   TRIG 127 02/04/2023   HDL 56 02/04/2023   LDLCALC 63 02/04/2023   ALT 23 02/04/2023   AST 34 02/04/2023   NA 142 02/04/2023   K 5.3 (H) 02/04/2023   CL 101 02/04/2023   CREATININE 0.83 02/04/2023   BUN 16 02/04/2023   CO2 30 (H) 02/04/2023   TSH 1.780 05/18/2022   HGBA1C 7.2 (H) 02/04/2023   MICROALBUR 10 12/02/2020      Assessment & Plan:    There are no diagnoses linked to this encounter.   No orders of the defined types were placed in this encounter.   No orders of the defined types were placed in this encounter.    Follow-up: No follow-ups on file.   I,Tyreona Panjwani I Leal-Borjas,acting as a scribe for Abigail Free, MD.,have documented all relevant documentation on the behalf of Abigail Free, MD,as directed by  Abigail Free, MD while in the presence of Abigail Free, MD.   An After Visit Summary was printed and given to the patient.  Abigail Free, MD [facility] [phone]

## 2023-05-14 NOTE — Progress Notes (Signed)
 This encounter was created in error - please disregard.

## 2023-05-17 DIAGNOSIS — H524 Presbyopia: Secondary | ICD-10-CM | POA: Diagnosis not present

## 2023-05-25 ENCOUNTER — Other Ambulatory Visit: Payer: Self-pay | Admitting: Family Medicine

## 2023-05-26 ENCOUNTER — Other Ambulatory Visit: Payer: Self-pay | Admitting: Family Medicine

## 2023-06-02 ENCOUNTER — Encounter: Payer: Self-pay | Admitting: Neurology

## 2023-06-02 ENCOUNTER — Ambulatory Visit (INDEPENDENT_AMBULATORY_CARE_PROVIDER_SITE_OTHER): Payer: No Typology Code available for payment source | Admitting: Neurology

## 2023-06-02 VITALS — BP 147/83 | HR 102 | Resp 16 | Ht 72.0 in

## 2023-06-02 DIAGNOSIS — R269 Unspecified abnormalities of gait and mobility: Secondary | ICD-10-CM

## 2023-06-02 DIAGNOSIS — Z1329 Encounter for screening for other suspected endocrine disorder: Secondary | ICD-10-CM | POA: Diagnosis not present

## 2023-06-02 DIAGNOSIS — E538 Deficiency of other specified B group vitamins: Secondary | ICD-10-CM | POA: Diagnosis not present

## 2023-06-02 DIAGNOSIS — E079 Disorder of thyroid, unspecified: Secondary | ICD-10-CM | POA: Diagnosis not present

## 2023-06-02 DIAGNOSIS — R32 Unspecified urinary incontinence: Secondary | ICD-10-CM | POA: Diagnosis not present

## 2023-06-02 DIAGNOSIS — R531 Weakness: Secondary | ICD-10-CM

## 2023-06-02 DIAGNOSIS — R799 Abnormal finding of blood chemistry, unspecified: Secondary | ICD-10-CM | POA: Diagnosis not present

## 2023-06-02 DIAGNOSIS — R700 Elevated erythrocyte sedimentation rate: Secondary | ICD-10-CM | POA: Diagnosis not present

## 2023-06-02 DIAGNOSIS — R413 Other amnesia: Secondary | ICD-10-CM | POA: Diagnosis not present

## 2023-06-02 DIAGNOSIS — E559 Vitamin D deficiency, unspecified: Secondary | ICD-10-CM | POA: Diagnosis not present

## 2023-06-02 DIAGNOSIS — R7309 Other abnormal glucose: Secondary | ICD-10-CM | POA: Diagnosis not present

## 2023-06-02 DIAGNOSIS — R7989 Other specified abnormal findings of blood chemistry: Secondary | ICD-10-CM | POA: Diagnosis not present

## 2023-06-02 DIAGNOSIS — R748 Abnormal levels of other serum enzymes: Secondary | ICD-10-CM | POA: Diagnosis not present

## 2023-06-02 DIAGNOSIS — R7982 Elevated C-reactive protein (CRP): Secondary | ICD-10-CM | POA: Diagnosis not present

## 2023-06-02 NOTE — Progress Notes (Signed)
 Chief Complaint  Patient presents with   New Patient (Initial Visit)    Rm14, alone, NP internal referral for Diabetic amyotrophy:pt stated that she saw dr. Terrace Arabia 15 plus years ago for mobility issues. Pt stated increase of pain in all joints of the body       ASSESSMENT AND PLAN  Christy Crawford is a 68 y.o. female   Long history of insulin-dependent diabetes, Reported subacute onset of bilateral lower extremity deep achy pain, worsening weakness,  Was given the diagnosis of diabetic amyotrophy Poor functional status, now presenting with worsening gait abnormality, urinary incontinence, memory loss, significant neck, low back pain, MRI of the brain, lumbar, cervical spine  Laboratory evaluations to rule out thyroid malfunction, intrinsic muscle disease Home health physical therapy  Return To Clinic virtual visit in 6 Months  DIAGNOSTIC DATA (LABS, IMAGING, TESTING) - I reviewed patient records, labs, notes, testing and imaging myself where available.   MEDICAL HISTORY:  Christy Crawford is a 68 year old female, seen in request by her primary care from [facility] Dr. Blane Ohara, for evaluation of subacute onset lower extremity deep achy pain, weakness  History is obtained from the patient and review of electronic medical records. I personally reviewed pertinent available imaging films in PACS.   PMHx of  HTN DM insulin dependent for more than 30 years, PN,  HLD Depression,  Obesity Hard of hearing,   She suffered significant depression since  she lost her younger daughter in 2011, husband was a truck driver, she went on the road with her husband, shortly afterwards she noticed difficulty getting in and out of truck, very painful from lower back to hip going down her leg to her ankle and feet, per patient,  She already had bilateral feet numbness from diabetic neuropathy for many years,  Since she developed weakness she had extensive evaluation,  she was given the diagnosis of  diabetic amyotrophy, she was not offered any treatment, she has no significant improvement from that weakness, but no worsening, at baseline, she was still able to take a few steps with walker transfer herself  Since July 2024 she noticed acute worsening with background of poor functional status, right hand numbness tingling, radiating pain from neck to occipital region, to her jawline, also to right shoulder, low back pain to bilateral lower extremity,  Also developed bowel and bladder incontinence,  PHYSICAL EXAM:   Vitals:   06/02/23 1350  BP: (!) 147/83  Pulse: (!) 102  Resp: 16  Height: 6' (1.829 m)   Not recorded     Body mass index is 44.08 kg/m.  PHYSICAL EXAMNIATION:  Gen: NAD, conversant, well nourised, well groomed                     Cardiovascular: Regular rate rhythm, no peripheral edema, warm, nontender. Eyes: Conjunctivae clear without exudates or hemorrhage Neck: Supple, no carotid bruits. Pulmonary: Clear to auscultation bilaterally   NEUROLOGICAL EXAM:  MENTAL STATUS: Speech/cognition: Depressed looking middle-age female, obese in her wheelchair, hard of hearing, awake, alert, oriented to history taking and casual conversation CRANIAL NERVES: CN II: Visual fields are full to confrontation. Pupils are round equal and briskly reactive to light. CN III, IV, VI: extraocular movement are normal. No ptosis. CN V: Facial sensation is intact to light touch CN VII: Face is symmetric with normal eye closure  CN VIII: Hard of hearing bilaterally CN IX, X: Phonation is normal. CN XI: Head turning and shoulder shrug  are intact  MOTOR: Upper extremity proximal and distal muscle strength is normal, significant discoloration, edema at bilateral lower extremity to mid shin level, antigravity movement of bilateral hip flexion knee flexion extension, and ankle dorsiflexion, examination is limited due to her big body habitus, pain,  REFLEXES: Areflexia  SENSORY:  Length-dependent sensory loss to knee level  COORDINATION: There is no trunk or limb dysmetria noted.  GAIT/STANCE: Deferred,  REVIEW OF SYSTEMS:  Full 14 system review of systems performed and notable only for as above All other review of systems were negative.   ALLERGIES: No Known Allergies  HOME MEDICATIONS: Current Outpatient Medications  Medication Sig Dispense Refill   acetaminophen (TYLENOL) 325 MG tablet Take 325 mg by mouth every 6 (six) hours as needed.     ALPRAZolam (XANAX) 1 MG tablet Take 1 mg by mouth in the morning, at noon, in the evening, and at bedtime.      amitriptyline (ELAVIL) 25 MG tablet TAKE 1 TABLET BY MOUTH EVERY DAY AT BEDTIME 30 tablet 1   COMFORT EZ PEN NEEDLES 31G X 5 MM MISC E11.44 use new needle with each new injection 100 each 2   finasteride (PROSCAR) 5 MG tablet TAKE 1 TABLET BY MOUTH DAILY 30 tablet 10   insulin degludec (TRESIBA FLEXTOUCH) 100 UNIT/ML FlexTouch Pen INJECT 65 UNITS SUBCUTANEOUSLY DAILY 15 mL 2   Lancets (ONETOUCH DELICA PLUS LANCET33G) MISC USE TO check blood glucose three times daily AS DIRECTED 100 each 12   lisinopril (ZESTRIL) 40 MG tablet TAKE ONE (1) TABLET BY MOUTH TWICE DAILY WITH BREAKFAST AND NIGHTLY AT BEDTIME 60 tablet 10   mirabegron ER (MYRBETRIQ) 25 MG TB24 tablet Take 1 tablet (25 mg total) by mouth daily. 30 tablet 2   montelukast (SINGULAIR) 10 MG tablet TAKE 1 TABLET BY MOUTH EVERY DAY AT BEDTIME 30 tablet 10   Multiple Vitamins-Minerals (CENTRUM SILVER PO) Take 1 tablet by mouth daily.     NOVOFINE PEN NEEDLE 32G X 6 MM MISC USE AS DIRECTED 100 each 10   NOVOFINE PLUS PEN NEEDLE 32G X 4 MM MISC      ONETOUCH VERIO test strip USE TO check blood glucose three times daily AS DIRECTED 100 strip 12   pregabalin (LYRICA) 300 MG capsule TAKE 1 CAPSULE BY MOUTH EVERY EVENING 30 capsule 1   rosuvastatin (CRESTOR) 10 MG tablet TAKE 1 TABLET BY MOUTH ONCE DAILY 30 tablet 10   sertraline (ZOLOFT) 100 MG tablet Take 1  tablet (100 mg total) by mouth 2 (two) times daily. (Patient taking differently: Take 200 mg by mouth daily.) 180 tablet 1   TRULICITY 3 MG/0.5ML SOAJ INJECT 3MG  SUBCUTANEOUSLY ONCE WEEKLY 2 mL 10   Vitamin D, Cholecalciferol, 25 MCG (1000 UT) CAPS Take 2,000 Int'l Units by mouth.     No current facility-administered medications for this visit.    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   lens implants Bilateral     FAMILY HISTORY: Family History  Adopted: Yes  Problem Relation Age of Onset   Diabetes Maternal Aunt     SOCIAL HISTORY: Social History   Socioeconomic History   Marital status: Married    Spouse name: Not on file   Number of children: 1   Years of education: Not on file   Highest education level: Not on file  Occupational History   Occupation: disabled  Tobacco Use   Smoking status: Former    Current packs/day: 0.00    Types: Cigarettes    Quit date: 2000    Years since quitting: 25.1   Smokeless tobacco: Never  Vaping Use   Vaping status: Never Used  Substance and Sexual Activity   Alcohol use: Yes    Alcohol/week: 1.0 standard drink of alcohol    Types: 1 Glasses of wine per week    Comment: socially   Drug use: Never   Sexual activity: Not on file  Other Topics Concern   Not on file  Social History Narrative   Not on file   Social Drivers of Health    Financial Resource Strain: Low Risk  (04/19/2023)   Overall Financial Resource Strain (CARDIA)    Difficulty of Paying Living Expenses: Not hard at all  Food Insecurity: No Food Insecurity (04/19/2023)   Hunger Vital Sign    Worried About Running Out of Food in the Last Year: Never true    Ran Out of Food in the Last Year: Never true  Transportation Needs: No Transportation Needs (04/19/2023)   PRAPARE - Administrator, Civil Service (Medical): No    Lack of Transportation (Non-Medical): No  Physical Activity: Inactive (04/19/2023)   Exercise Vital Sign    Days of Exercise per Week: 0 days    Minutes of Exercise per Session: 0 min  Stress: No Stress Concern Present (04/19/2023)   [employer] of Occupational Health - Occupational Stress Questionnaire    Feeling of Stress : Not at all  Social Connections: Moderately Isolated (04/19/2023)   Social Connection and Isolation Panel [NHANES]    Frequency of Communication with Friends and Family: More than three times a week    Frequency of Social Gatherings with Friends and Family: More than three times a week    Attends Religious Services: Never    Database administrator or Organizations: No    Attends Banker Meetings: Never    Marital Status: Married  Catering manager Violence: Not At Risk (04/19/2023)   Humiliation, Afraid, Rape, and Kick questionnaire    Fear of Current or Ex-Partner: No    Emotionally Abused: No    Physically Abused: No    Sexually Abused: No      Levert Feinstein, M.D. Ph.D.  [facility] [address], [address] [place], [place] [address] Ph: [phone] Fax: [phone]  CC:  Blane Ohara, MD [address] [place],  [place] [address]  Blane Ohara, MD

## 2023-06-03 ENCOUNTER — Telehealth: Payer: Self-pay | Admitting: Neurology

## 2023-06-03 NOTE — Telephone Encounter (Signed)
 [facility] will be taking this patient.

## 2023-06-06 LAB — MULTIPLE MYELOMA PANEL, SERUM
Albumin SerPl Elph-Mcnc: 3.9 g/dL (ref 2.9–4.4)
Albumin/Glob SerPl: 1.5 (ref 0.7–1.7)
Alpha 1: 0.2 g/dL (ref 0.0–0.4)
Alpha2 Glob SerPl Elph-Mcnc: 0.9 g/dL (ref 0.4–1.0)
B-Globulin SerPl Elph-Mcnc: 0.9 g/dL (ref 0.7–1.3)
Gamma Glob SerPl Elph-Mcnc: 0.7 g/dL (ref 0.4–1.8)
Globulin, Total: 2.7 g/dL (ref 2.2–3.9)
IgA/Immunoglobulin A, Serum: 181 mg/dL (ref 87–352)
IgG (Immunoglobin G), Serum: 656 mg/dL (ref 586–1602)
IgM (Immunoglobulin M), Srm: 97 mg/dL (ref 26–217)
Total Protein: 6.6 g/dL (ref 6.0–8.5)

## 2023-06-06 LAB — HGB A1C W/O EAG: Hgb A1c MFr Bld: 6.8 % — ABNORMAL HIGH (ref 4.8–5.6)

## 2023-06-06 LAB — VITAMIN D 25 HYDROXY (VIT D DEFICIENCY, FRACTURES): Vit D, 25-Hydroxy: 34.1 ng/mL (ref 30.0–100.0)

## 2023-06-06 LAB — C-REACTIVE PROTEIN: CRP: 3 mg/L (ref 0–10)

## 2023-06-06 LAB — SEDIMENTATION RATE: Sed Rate: 10 mm/h (ref 0–40)

## 2023-06-06 LAB — RPR: RPR Ser Ql: NONREACTIVE

## 2023-06-06 LAB — CK: Total CK: 62 U/L (ref 32–182)

## 2023-06-06 LAB — ANA W/REFLEX IF POSITIVE: Anti Nuclear Antibody (ANA): NEGATIVE

## 2023-06-06 LAB — VITAMIN B12: Vitamin B-12: 863 pg/mL (ref 232–1245)

## 2023-06-06 LAB — TSH: TSH: 2.35 u[IU]/mL (ref 0.450–4.500)

## 2023-06-07 ENCOUNTER — Encounter: Payer: Self-pay | Admitting: Neurology

## 2023-06-09 ENCOUNTER — Telehealth: Payer: Self-pay | Admitting: Neurology

## 2023-06-09 NOTE — Telephone Encounter (Signed)
 Return call to [employer], verbal order start date on 06/15/23 given

## 2023-06-09 NOTE — Telephone Encounter (Signed)
 Christy Crawford from [place] is needing VO for Start of care Dates. VM is secure.

## 2023-06-09 NOTE — Telephone Encounter (Signed)
 Docia Barrier: [id number] exp. 06/09/23-08/09/23 sent to [facility] [phone]

## 2023-06-13 ENCOUNTER — Other Ambulatory Visit: Payer: Self-pay | Admitting: Family Medicine

## 2023-06-15 NOTE — Telephone Encounter (Signed)
 Received call form jill, PT. They have been unable to reach patient or daughter, Gave verbal standing order for start of care once patient calls back

## 2023-06-20 ENCOUNTER — Encounter: Payer: Self-pay | Admitting: Family Medicine

## 2023-06-20 ENCOUNTER — Ambulatory Visit: Payer: No Typology Code available for payment source | Admitting: Family Medicine

## 2023-06-20 VITALS — BP 138/78 | HR 93 | Temp 98.3°F | Ht 73.0 in | Wt 321.0 lb

## 2023-06-20 DIAGNOSIS — R413 Other amnesia: Secondary | ICD-10-CM

## 2023-06-20 DIAGNOSIS — F330 Major depressive disorder, recurrent, mild: Secondary | ICD-10-CM

## 2023-06-20 DIAGNOSIS — Z6841 Body Mass Index (BMI) 40.0 and over, adult: Secondary | ICD-10-CM | POA: Diagnosis not present

## 2023-06-20 DIAGNOSIS — I10 Essential (primary) hypertension: Secondary | ICD-10-CM | POA: Diagnosis not present

## 2023-06-20 DIAGNOSIS — E1144 Type 2 diabetes mellitus with diabetic amyotrophy: Secondary | ICD-10-CM

## 2023-06-20 DIAGNOSIS — E1159 Type 2 diabetes mellitus with other circulatory complications: Secondary | ICD-10-CM

## 2023-06-20 DIAGNOSIS — E66813 Obesity, class 3: Secondary | ICD-10-CM

## 2023-06-20 DIAGNOSIS — K580 Irritable bowel syndrome with diarrhea: Secondary | ICD-10-CM | POA: Diagnosis not present

## 2023-06-20 DIAGNOSIS — E782 Mixed hyperlipidemia: Secondary | ICD-10-CM

## 2023-06-20 DIAGNOSIS — M797 Fibromyalgia: Secondary | ICD-10-CM

## 2023-06-20 DIAGNOSIS — I152 Hypertension secondary to endocrine disorders: Secondary | ICD-10-CM | POA: Diagnosis not present

## 2023-06-20 DIAGNOSIS — R7309 Other abnormal glucose: Secondary | ICD-10-CM | POA: Diagnosis not present

## 2023-06-20 MED ORDER — DICYCLOMINE HCL 10 MG PO CAPS
10.0000 mg | ORAL_CAPSULE | Freq: Three times a day (TID) | ORAL | 0 refills | Status: DC
Start: 2023-06-20 — End: 2023-07-27

## 2023-06-20 NOTE — Patient Instructions (Signed)
 Start dicyclomine 10 mg before meals and before bed for irritable bowel.  Check labs today.

## 2023-06-20 NOTE — Progress Notes (Signed)
 Subjective:  Patient ID: Christy Crawford, female    DOB: 1955-11-03  Age: 68 y.o. MRN: [id number]  Chief Complaint  Patient presents with   Medical Management of Chronic Issues   Discussed the use of AI scribe software for clinical note transcription with the patient, who gave verbal consent to proceed.  History of Present Illness   Christy Crawford is a 68 year old female with diabetes and neuropathy who presents with IBS symptoms and medication management.  She has been experiencing irritable bowel syndrome (IBS) symptoms for the past couple of months, characterized by diarrhea occurring every time she eats, regardless of the time of day. These symptoms began after her hernia surgery, and she notes the necessity of being near a bathroom due to her limited mobility.  Her diabetes is managed with Trulicity and Tresiba 65 units, with blood sugar levels ranging from the 70s to 165 mg/dL. She checks her blood sugar three times a day. Since her hernia surgery, she has gained better control over her bladder and no longer needs to wear adult diapers.  She takes Lyrica 300 mg twice a day, which helps with her neuropathy and sleep. Her medication regimen also includes rosuvastatin 10 mg daily for cholesterol, lisinopril 40 mg twice daily for high blood pressure, sertraline 200 mg twice daily, and Xanax three times a day. She notes a reduction in her Xanax dosage from four to three times a day over the years.   No fevers, chills, sweats, earaches, sore throat, or stuffy nose. She has been experiencing hair regrowth, possibly due to extra vitamins or zinc.     Diabetes:  Complications: Diabetic macular edema. Amyotropy/neuropathy. Glucose checking: Patient checks sugars three times daily. Glucose logs: lowest 70s and highest 165 Hypoglycemia: a few readings in the 70's, but patient does not have symptoms or feel like its low.  Most recent A1C: 6.8 Current medications: Patient is  still currently taking  Trulicity 3 mg once weekly. Also taking Tresiba 65 units qhs, Lyrica 300 mg one tablet twice daily. Helps with sleep due to easing neuropathy.  Last Eye Exam. 05/2023 Seeing every 6 months. Dr. Precious Bard Foot checks: Patient checks her feet daily.  Hyperlipidemia: Current medications: Patient is currently taking Rosuvastatin 10 mg take 1 tablet daily.  Hypertension: Current medications: Patient is currently taking Lisinopril 40 mg twice daily.  Depression: Patient is currently taking Zoloft 100 mg twice daily. On xanax 1 mg oral three times daily.   Sees Dr Barnett Abu every 6 months.   Urge incontinence: on mybetriq 25 mg daily. Doing very well.  Diet: eating very healthy Exercise: Patient is unable to exercise due to Amyotrophy/Neuropathy.       04/19/2023    1:16 PM 02/04/2023   11:03 AM 10/28/2022    3:03 PM 05/18/2022    1:38 PM 04/13/2022    1:30 PM  Depression screen PHQ 2/9  Decreased Interest 1 0 1 0 1  Down, Depressed, Hopeless 2 0 0 0 0  PHQ - 2 Score 3 0 1 0 1  Altered sleeping 2 1 2 1    Tired, decreased energy  1 1 3    Change in appetite 1 0 2 1   Feeling bad or failure about yourself  0 0 0 1   Trouble concentrating  0 0 0   Moving slowly or fidgety/restless 2 0 0 1   Suicidal thoughts 0 0 0 0   PHQ-9 Score 8 2 6  7  Difficult doing work/chores Not difficult at all Not difficult at all Somewhat difficult Somewhat difficult         04/19/2023    1:29 PM  Fall Risk   Falls in the past year? 0  Number falls in past yr: 0  Injury with Fall? 0  Follow up Falls evaluation completed;Education provided;Falls prevention discussed    Patient Care Team: Blane Ohara, MD as PCP - General (Family Medicine) Levert Feinstein, MD as Consulting Physician (Neurology) Milagros Evener, MD as Consulting Physician (Psychiatry) Albin Felling, OD (Optometry)   Review of Systems  Constitutional:  Negative for chills, fatigue and fever.  HENT:  Negative for congestion, ear pain,  rhinorrhea and sore throat.   Respiratory:  Negative for cough and shortness of breath.   Cardiovascular:  Negative for chest pain.  Gastrointestinal:  Negative for abdominal pain, constipation, diarrhea, nausea and vomiting.  Genitourinary:  Negative for dysuria and urgency.  Musculoskeletal:  Positive for arthralgias and back pain. Negative for myalgias.  Neurological:  Negative for dizziness, weakness, light-headedness and headaches.  Psychiatric/Behavioral:  Negative for dysphoric mood. The patient is not nervous/anxious.     Current Outpatient Medications on File Prior to Visit  Medication Sig Dispense Refill   acetaminophen (TYLENOL) 325 MG tablet Take 325 mg by mouth every 6 (six) hours as needed.     ALPRAZolam (XANAX) 1 MG tablet Take 1 mg by mouth in the morning, at noon, in the evening, and at bedtime.      COMFORT EZ PEN NEEDLES 31G X 5 MM MISC E11.44 use new needle with each new injection 100 each 2   Lancets (ONETOUCH DELICA PLUS LANCET33G) MISC USE TO check blood glucose three times daily AS DIRECTED 100 each 12   lisinopril (ZESTRIL) 40 MG tablet TAKE ONE (1) TABLET BY MOUTH TWICE DAILY WITH BREAKFAST AND NIGHTLY AT BEDTIME 60 tablet 10   montelukast (SINGULAIR) 10 MG tablet TAKE 1 TABLET BY MOUTH EVERY DAY AT BEDTIME 30 tablet 10   Multiple Vitamins-Minerals (CENTRUM SILVER PO) Take 1 tablet by mouth daily.     NOVOFINE PEN NEEDLE 32G X 6 MM MISC USE AS DIRECTED 100 each 10   NOVOFINE PLUS PEN NEEDLE 32G X 4 MM MISC      ONETOUCH VERIO test strip USE TO check blood glucose three times daily AS DIRECTED 100 strip 12   rosuvastatin (CRESTOR) 10 MG tablet TAKE 1 TABLET BY MOUTH ONCE DAILY 30 tablet 10   sertraline (ZOLOFT) 100 MG tablet Take 1 tablet (100 mg total) by mouth 2 (two) times daily. (Patient taking differently: Take 200 mg by mouth daily.) 180 tablet 1   TRESIBA FLEXTOUCH 100 UNIT/ML FlexTouch Pen INJECT 65 UNITS SUBCUTANEOUSLY EVERY DAY 15 mL 10   TRULICITY 3  MG/0.5ML SOAJ INJECT 3MG  SUBCUTANEOUSLY ONCE WEEKLY 2 mL 10   Vitamin D, Cholecalciferol, 25 MCG (1000 UT) CAPS Take 2,000 Int'l Units by mouth.     No current facility-administered medications on file prior to visit.   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   lens implants Bilateral     Family History  Adopted: Yes  Problem Relation Age of Onset   Diabetes Maternal Aunt    Social History   Socioeconomic History   Marital status: Married    Spouse name: Not on file   Number of children: 1   Years of education: Not on file   Highest education level: Not on file  Occupational History   Occupation: disabled  Tobacco Use   Smoking status: Former    Current packs/day: 0.00    Types: Cigarettes    Quit date: 2000    Years since quitting: 25.2   Smokeless tobacco: Never  Vaping Use   Vaping status: Never Used  Substance and Sexual Activity   Alcohol use: Yes    Alcohol/week: 1.0 standard drink of alcohol    Types: 1 Glasses of wine per week    Comment: socially   Drug use: Never   Sexual activity: Not on file  Other Topics Concern   Not on file  Social History Narrative   Not on file   Social Drivers of Health   Financial Resource Strain: Low Risk  (04/19/2023)   Overall Financial Resource Strain (CARDIA)    Difficulty of Paying Living Expenses: Not hard at all  Food Insecurity: No Food Insecurity (04/19/2023)   Hunger Vital  Sign    Worried About Running Out of Food in the Last Year: Never true    Ran Out of Food in the Last Year: Never true  Transportation Needs: No Transportation Needs (04/19/2023)   PRAPARE - Administrator, Civil Service (Medical): No    Lack of Transportation (Non-Medical): No  Physical Activity: Inactive (04/19/2023)   Exercise Vital Sign    Days of Exercise per Week: 0 days    Minutes of Exercise per Session: 0 min  Stress: No Stress Concern Present (04/19/2023)   [employer] of Occupational Health - Occupational Stress Questionnaire    Feeling of Stress : Not at all  Social Connections: Moderately Isolated (04/19/2023)   Social Connection and Isolation Panel [NHANES]    Frequency of Communication with Friends and Family: More than three times a week    Frequency of Social Gatherings with Friends and Family: More than three times a week    Attends Religious Services: Never    Database administrator or Organizations: No    Attends Engineer, structural: Never    Marital Status: Married    Objective:  BP 138/78   Pulse 93   Temp 98.3 F (36.8 C)   Ht 6\' 1"  (1.854 m)   Wt (!) 321 lb (145.6 kg)   SpO2 100%   BMI 42.35 kg/m      06/20/2023    2:34 PM 06/02/2023    1:50 PM 02/04/2023   11:00 AM  BP/Weight  Systolic BP 138 147 136  Diastolic BP 78 83 80  Wt. (Lbs) 321  325  BMI 42.35 kg/m2  41.95 kg/m2    Physical Exam Vitals reviewed.  Constitutional:      Appearance: Normal appearance. She is obese.  Neck:     Vascular: No carotid bruit.  Cardiovascular:     Rate and Rhythm: Normal rate and regular rhythm.     Heart sounds: Normal heart sounds.  Pulmonary:     Effort: Pulmonary effort is normal. No respiratory distress.     Breath sounds: Normal breath sounds.  Abdominal:     General: Abdomen is flat. Bowel sounds are normal.     Palpations: Abdomen is soft.     Tenderness: There is no abdominal tenderness.  Musculoskeletal:     Right  lower leg: Edema present.     Left lower leg: Edema present.  Skin:    Comments: Hair is thinning.  Neurological:     Mental Status: She is alert and oriented to person, place, and time.  Psychiatric:        Mood and Affect: Mood normal.        Behavior: Behavior normal.     Diabetic Foot Exam - Simple   Simple Foot Form Diabetic Foot exam was performed with the following findings: Yes 06/20/2023  9:26 PM  Visual Inspection No deformities, no ulcerations, no other skin breakdown bilaterally: Yes Sensation Testing See comments: Yes Pulse Check Posterior Tibialis and Dorsalis pulse intact bilaterally: Yes Comments      Lab Results  Component Value Date   WBC 6.4 06/20/2023   HGB 14.0 06/20/2023   HCT 42.9 06/20/2023   PLT 151 06/20/2023   GLUCOSE 166 (H) 06/20/2023   CHOL 139 06/20/2023   TRIG 133 06/20/2023   HDL 52 06/20/2023   LDLCALC 64 06/20/2023   ALT 27 06/20/2023   AST 34 06/20/2023   NA 141 06/20/2023   K 5.0 06/20/2023   CL 102 06/20/2023   CREATININE 0.73 06/20/2023   BUN 14 06/20/2023   CO2 28 06/20/2023   TSH 2.350 06/02/2023   HGBA1C 6.7 (H) 06/20/2023   MICROALBUR 10 12/02/2020      Assessment & Plan:  Assessment and Plan    Irritable Bowel Syndrome (IBS) Diarrhea occurs with every meal, at least three times daily, persisting for months post-hernia surgery. No recent medication changes to account for symptoms. Suspected IBS, managed with medication. - Prescribe dicyclomine (Bentyl) 30 minutes before meals and optionally before bed. - Send prescription to [facility] for a 90-day supply.  Diabetes Mellitus Blood glucose levels range from 70 to 165 mg/dL. Managed with Trulicity and [place]. Recent A1c of 6.8 indicates improved control.  Peripheral Neuropathy Neuropathy partially managed with Lyrica, which also aids sleep quality.  Hypertension Managed with lisinopril 40 mg twice daily.  Hyperlipidemia Managed with rosuvastatin 10 mg daily.  Previous cholesterol levels in November were satisfactory.  Depression and Anxiety Managed with sertraline (Zoloft) 200 mg twice daily and Xanax reduced to three times daily.  Urinary Incontinence Significant improvement in bladder control post-hernia surgery. Uncertain if Myrbetriq is still being taken for bladder management. - Confirm if taking Myrbetriq or Gemtesa when calling with lab results.  Follow-up Scheduled for three MRIs of the spine to investigate right hand and leg issues. Follow-up in three months. - Order three MRIs of the spine. - Schedule follow-up appointment in three months.         Hypertension associated with diabetes [facility]) Assessment & Plan: Well controlled.  Continue LISINOPRIL 40 MG twice DAILY.  Continue to work on eating a healthy diet and exercise.  Labs drawn today.  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 Recommend check feet daily. Recommend annual eye exams. Medicines: Continue  Trulicity 3 mg once weekly. Also taking Tresiba 65 units qhs, Lyrica 300 mg one tablet twice daily. Continue to work on eating a healthy diet and exercise.  Labs drawn today.    Orders: -     Hgb A1c w/o eAG  Mixed hyperlipidemia Assessment & Plan: Well controlled.  No changes to medicines. Rosuvastatin 10 mg take 1 tablet daily.  Continue to work on eating a healthy diet and exercise.  Labs drawn today.  Orders: -     Lipid panel  Fibromyalgia Assessment & Plan: Continue amitriptyline 25 mg before bed.    Memory loss  Essential hypertension, benign Assessment & Plan: Well controlled.  Continue LISINOPRIL 40 MG twice DAILY.  Continue to work on eating a healthy diet and exercise.  Labs drawn today.   Class 3 severe obesity due to excess calories with serious  comorbidity and body mass index (BMI) of 40.0 to 44.9 in adult [facility]) Assessment & Plan: Recommend continue to work on eating healthy diet and exercise.    Irritable bowel syndrome with diarrhea Assessment & Plan: Start dicyclomine 10 mg before meals and before bed for irritable bowel.   Orders: -     Dicyclomine HCl; Take 1 capsule (10 mg total) by mouth 4 (four) times daily -  before meals and at bedtime.  Dispense: 360 capsule; Refill: 0  Mild recurrent major depression (HCC) Assessment & Plan: The current medical regimen is effective;  continue present plan and medications. Continue Zoloft 100 mg twice daily and xanax 1 mg oral three times a day. Sees Dr Barnett Abu every 6 months.       Meds ordered this encounter  Medications   dicyclomine (BENTYL) 10 MG capsule    Sig: Take 1 capsule (10 mg total) by mouth 4 (four) times daily -  before meals and at bedtime.    Dispense:  360 capsule    Refill:  0    Orders Placed This Encounter  Procedures   CBC with Differential/Platelet   Comprehensive metabolic panel   Lipid panel   Hgb A1c w/o eAG     Follow-up: Return in about 3 months (around 09/20/2023) for chronic follow up.   I,Marla I Leal-Borjas,acting as a scribe for Blane Ohara, MD.,have documented all relevant documentation on the behalf of Blane Ohara, MD,as directed by  Blane Ohara, MD while in the presence of Blane Ohara, MD.   An After Visit Summary was printed and given to the patient.  I attest that I have reviewed this visit and agree with the plan scribed by my staff.   Blane Ohara, MD [facility] [phone]

## 2023-06-21 ENCOUNTER — Encounter: Payer: Self-pay | Admitting: Family Medicine

## 2023-06-21 ENCOUNTER — Other Ambulatory Visit: Payer: Self-pay | Admitting: Family Medicine

## 2023-06-21 LAB — COMPREHENSIVE METABOLIC PANEL
ALT: 27 IU/L (ref 0–32)
AST: 34 IU/L (ref 0–40)
Albumin: 4.1 g/dL (ref 3.9–4.9)
Alkaline Phosphatase: 138 IU/L — ABNORMAL HIGH (ref 44–121)
BUN/Creatinine Ratio: 19 (ref 12–28)
BUN: 14 mg/dL (ref 8–27)
Bilirubin Total: 0.3 mg/dL (ref 0.0–1.2)
CO2: 28 mmol/L (ref 20–29)
Calcium: 9.3 mg/dL (ref 8.7–10.3)
Chloride: 102 mmol/L (ref 96–106)
Creatinine, Ser: 0.73 mg/dL (ref 0.57–1.00)
Globulin, Total: 1.8 g/dL (ref 1.5–4.5)
Glucose: 166 mg/dL — ABNORMAL HIGH (ref 70–99)
Potassium: 5 mmol/L (ref 3.5–5.2)
Sodium: 141 mmol/L (ref 134–144)
Total Protein: 5.9 g/dL — ABNORMAL LOW (ref 6.0–8.5)
eGFR: 90 mL/min/{1.73_m2} (ref >59)

## 2023-06-21 LAB — CBC WITH DIFFERENTIAL/PLATELET
Basophils Absolute: 0.1 10*3/uL (ref 0.0–0.2)
Basos: 1 %
EOS (ABSOLUTE): 0.2 10*3/uL (ref 0.0–0.4)
Eos: 4 %
Hematocrit: 42.9 % (ref 34.0–46.6)
Hemoglobin: 14 g/dL (ref 11.1–15.9)
Immature Grans (Abs): 0.1 10*3/uL (ref 0.0–0.1)
Immature Granulocytes: 1 %
Lymphocytes Absolute: 1.1 10*3/uL (ref 0.7–3.1)
Lymphs: 18 %
MCH: 29 pg (ref 26.6–33.0)
MCHC: 32.6 g/dL (ref 31.5–35.7)
MCV: 89 fL (ref 79–97)
Monocytes Absolute: 0.4 10*3/uL (ref 0.1–0.9)
Monocytes: 6 %
Neutrophils Absolute: 4.6 10*3/uL (ref 1.4–7.0)
Neutrophils: 70 %
Platelets: 151 10*3/uL (ref 150–450)
RBC: 4.83 x10E6/uL (ref 3.77–5.28)
RDW: 13.7 % (ref 11.7–15.4)
WBC: 6.4 10*3/uL (ref 3.4–10.8)

## 2023-06-21 LAB — LIPID PANEL
Chol/HDL Ratio: 2.7 ratio (ref 0.0–4.4)
Cholesterol, Total: 139 mg/dL (ref 100–199)
HDL: 52 mg/dL (ref >39)
LDL Chol Calc (NIH): 64 mg/dL (ref 0–99)
Triglycerides: 133 mg/dL (ref 0–149)
VLDL Cholesterol Cal: 23 mg/dL (ref 5–40)

## 2023-06-22 ENCOUNTER — Other Ambulatory Visit: Payer: Self-pay

## 2023-06-23 MED ORDER — PREGABALIN 300 MG PO CAPS: 300.0000 mg | ORAL_CAPSULE | Freq: Every day | ORAL | 1 refills | Status: DC

## 2023-06-24 ENCOUNTER — Ambulatory Visit (HOSPITAL_COMMUNITY)
Admission: RE | Admit: 2023-06-24 | Discharge: 2023-06-24 | Disposition: A | Discharge status: Home / Self Care | Source: Ambulatory Visit | Attending: Neurology | Admitting: Neurology

## 2023-06-24 DIAGNOSIS — R9082 White matter disease, unspecified: Secondary | ICD-10-CM | POA: Diagnosis not present

## 2023-06-24 DIAGNOSIS — M51369 Other intervertebral disc degeneration, lumbar region without mention of lumbar back pain or lower extremity pain: Secondary | ICD-10-CM | POA: Diagnosis not present

## 2023-06-24 DIAGNOSIS — R413 Other amnesia: Secondary | ICD-10-CM | POA: Insufficient documentation

## 2023-06-24 DIAGNOSIS — R269 Unspecified abnormalities of gait and mobility: Secondary | ICD-10-CM

## 2023-06-24 DIAGNOSIS — M47816 Spondylosis without myelopathy or radiculopathy, lumbar region: Secondary | ICD-10-CM | POA: Diagnosis not present

## 2023-06-24 DIAGNOSIS — M4802 Spinal stenosis, cervical region: Secondary | ICD-10-CM | POA: Diagnosis not present

## 2023-06-24 DIAGNOSIS — R270 Ataxia, unspecified: Secondary | ICD-10-CM | POA: Diagnosis not present

## 2023-06-24 DIAGNOSIS — M4807 Spinal stenosis, lumbosacral region: Secondary | ICD-10-CM | POA: Diagnosis not present

## 2023-06-24 DIAGNOSIS — R531 Weakness: Secondary | ICD-10-CM

## 2023-06-24 DIAGNOSIS — M47812 Spondylosis without myelopathy or radiculopathy, cervical region: Secondary | ICD-10-CM | POA: Diagnosis not present

## 2023-06-24 DIAGNOSIS — M48061 Spinal stenosis, lumbar region without neurogenic claudication: Secondary | ICD-10-CM | POA: Diagnosis not present

## 2023-06-24 DIAGNOSIS — M4312 Spondylolisthesis, cervical region: Secondary | ICD-10-CM | POA: Diagnosis not present

## 2023-06-24 LAB — HGB A1C W/O EAG: Hgb A1c MFr Bld: 6.7 % — ABNORMAL HIGH (ref 4.8–5.6)

## 2023-06-25 DIAGNOSIS — K580 Irritable bowel syndrome with diarrhea: Secondary | ICD-10-CM | POA: Insufficient documentation

## 2023-06-25 DIAGNOSIS — R197 Diarrhea, unspecified: Secondary | ICD-10-CM | POA: Insufficient documentation

## 2023-06-25 NOTE — Assessment & Plan Note (Signed)
Continue amitriptyline 25 mg before bed.  

## 2023-06-25 NOTE — Assessment & Plan Note (Signed)
 Recommend continue to work on eating healthy diet and exercise.

## 2023-06-25 NOTE — Assessment & Plan Note (Signed)
 Well controlled.  Continue LISINOPRIL 40 MG twice DAILY.  Continue to work on eating a healthy diet and exercise.  Labs drawn today.

## 2023-06-25 NOTE — Assessment & Plan Note (Signed)
 The current medical regimen is effective;  continue present plan and medications. Continue Zoloft 100 mg twice daily and xanax 1 mg oral three times a day. Sees Dr Barnett Abu every 6 months.

## 2023-06-25 NOTE — Assessment & Plan Note (Signed)
Well controlled.  No changes to medicines. Rosuvastatin 10 mg take 1 tablet daily.  Continue to work on eating a healthy diet and exercise.  Labs drawn today. 

## 2023-06-25 NOTE — Assessment & Plan Note (Signed)
 Start dicyclomine 10 mg before meals and before bed for irritable bowel.

## 2023-06-25 NOTE — Assessment & Plan Note (Signed)
Control: good Recommend check sugars fasting daily. Recommend check feet daily. Recommend annual eye exams. Medicines: Continue Trulicity 3 mg once weekly. Also taking Tresiba 65 units qhs, Lyrica 300 mg one tablet twice daily. Continue to work on eating a healthy diet and exercise.  Labs drawn today.

## 2023-07-04 ENCOUNTER — Other Ambulatory Visit: Payer: Self-pay | Admitting: Family Medicine

## 2023-07-05 ENCOUNTER — Telehealth: Payer: Self-pay | Admitting: Family Medicine

## 2023-07-05 NOTE — Telephone Encounter (Signed)
 Please see below: pt requesting alternative medication due to cost    Copied from CRM [id number]. Topic: Clinical - Prescription Issue >> Jul 05, 2023 12:30 PM Shardie S wrote: Reason for CRM: Requesting an alternative medication for MYRBETRIQ 25 MG TB24 tablet, due to the cost. Patient's spouse states medication is not covered by insurance and they can not afford the cost.   Request that new medication be sent to: [facility] [address] in [place], [place]  Patient CB# [phone], spouse-Russell Rosette Reveal

## 2023-07-07 ENCOUNTER — Other Ambulatory Visit: Payer: Self-pay

## 2023-07-07 MED ORDER — SOLIFENACIN SUCCINATE 10 MG PO TABS: 10.0000 mg | ORAL_TABLET | Freq: Every day | ORAL | 1 refills | Status: DC

## 2023-07-07 NOTE — Progress Notes (Signed)
 Sent vesicare as she has not tried it before. Looks like she tried Oxybutynin, Gemtesa and myrbetriq but they are expensive or did not work for her. Could try vesicare.  thanks

## 2023-07-08 ENCOUNTER — Telehealth: Payer: Self-pay

## 2023-07-08 NOTE — Telephone Encounter (Signed)
 I set up an appointment.  Copied from CRM [id number]. Topic: Clinical - Medication Question >> Jul 07, 2023  1:17 PM Nyra Capes wrote: Reason for CRM: patient calling in asking for a prescription for a wheelchair.  Insurance stated patient provider needs to fax in prescription Insurance company [employer] [facility] who is under [facility]   [employer] phone # [phone]   fax # [phone]  Patient phone # [phone] ok to leave detailed message.

## 2023-07-12 ENCOUNTER — Other Ambulatory Visit: Payer: Self-pay | Admitting: Family Medicine

## 2023-07-12 ENCOUNTER — Telehealth: Payer: Self-pay | Admitting: *Deleted

## 2023-07-12 MED ORDER — SOLIFENACIN SUCCINATE 10 MG PO TABS: 10.0000 mg | ORAL_TABLET | Freq: Every day | ORAL | 1 refills | Status: DC

## 2023-07-12 NOTE — Telephone Encounter (Signed)
 Pt call for mri results. Please call [phone]

## 2023-07-12 NOTE — Telephone Encounter (Signed)
 Returned call to pt husband and stated: waiting on results once we get them and provider makes their recommendations we will be back in touch. Pt husband voiced gratitude and understanding

## 2023-07-18 ENCOUNTER — Encounter: Payer: Self-pay | Admitting: Neurology

## 2023-07-19 ENCOUNTER — Encounter: Payer: Self-pay | Admitting: Neurology

## 2023-07-20 ENCOUNTER — Telehealth: Payer: Self-pay

## 2023-07-20 NOTE — Telephone Encounter (Signed)
 Please call patient MRI of brain showed small vessel disease no acute abnormality  MRI of cervical and lumbar spine showed multilevel degenerative changes, there was no evidence of nerve root compression  If she has a lot of question about her MRI findings may move up her virtual visit for us  to go over the result in detail  IMPRESSION: 1. No acute intracranial abnormality.  No acute infarct. 2. Chronic small vessel changes. 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   IMPRESSION: 1. Mild-to-moderate canal stenoses at C4-C5 and C5-C6 secondary to disc osteophyte complexes and facet arthropathy. 2. Severe foraminal stenoses at C4-C5, C5-C6, and C6-C7 secondary to uncovertebral joint disease. 3. No signal abnormality or compression of the cord.   IMPRESSION: 1. Moderate canal stenosis at L2-L3 and L3-L4 secondary to disc bulging and facet arthropathy. 2. Mild foraminal stenosis bilaterally at L5-S1.

## 2023-07-20 NOTE — Telephone Encounter (Signed)
 Call to husband and reviewed MRI of brain and spine. He verbalized understanding and had no further questions

## 2023-07-26 DIAGNOSIS — E1169 Type 2 diabetes mellitus with other specified complication: Secondary | ICD-10-CM | POA: Diagnosis not present

## 2023-07-26 DIAGNOSIS — E785 Hyperlipidemia, unspecified: Secondary | ICD-10-CM | POA: Diagnosis not present

## 2023-07-26 DIAGNOSIS — R2681 Unsteadiness on feet: Secondary | ICD-10-CM | POA: Diagnosis not present

## 2023-07-26 DIAGNOSIS — Z008 Encounter for other general examination: Secondary | ICD-10-CM | POA: Diagnosis not present

## 2023-07-26 DIAGNOSIS — Z6841 Body Mass Index (BMI) 40.0 and over, adult: Secondary | ICD-10-CM | POA: Diagnosis not present

## 2023-07-26 DIAGNOSIS — I10 Essential (primary) hypertension: Secondary | ICD-10-CM | POA: Diagnosis not present

## 2023-07-26 DIAGNOSIS — Z794 Long term (current) use of insulin: Secondary | ICD-10-CM | POA: Diagnosis not present

## 2023-07-27 ENCOUNTER — Other Ambulatory Visit: Payer: Self-pay | Admitting: Family Medicine

## 2023-07-27 DIAGNOSIS — K580 Irritable bowel syndrome with diarrhea: Secondary | ICD-10-CM

## 2023-08-04 DIAGNOSIS — E113393 Type 2 diabetes mellitus with moderate nonproliferative diabetic retinopathy without macular edema, bilateral: Secondary | ICD-10-CM | POA: Diagnosis not present

## 2023-08-09 NOTE — Progress Notes (Unsigned)
 Subjective:  Patient ID: Christy Crawford, female    DOB: 25-Oct-1955  Age: 68 y.o. MRN: [id number]  No chief complaint on file.   HPI:       04/19/2023    1:16 PM 02/04/2023   11:03 AM 10/28/2022    3:03 PM 05/18/2022    1:38 PM 04/13/2022    1:30 PM  Depression screen PHQ 2/9  Decreased Interest 1 0 1 0 1  Down, Depressed, Hopeless 2 0 0 0 0  PHQ - 2 Score 3 0 1 0 1  Altered sleeping 2 1 2 1    Tired, decreased energy  1 1 3    Change in appetite 1 0 2 1   Feeling bad or failure about yourself  0 0 0 1   Trouble concentrating  0 0 0   Moving slowly or fidgety/restless 2 0 0 1   Suicidal thoughts 0 0 0 0   PHQ-9 Score 8 2 6 7    Difficult doing work/chores Not difficult at all Not difficult at all Somewhat difficult Somewhat difficult         04/19/2023    1:29 PM  Fall Risk   Falls in the past year? 0  Number falls in past yr: 0  Injury with Fall? 0  Follow up Falls evaluation completed;Education provided;Falls prevention discussed    Patient Care Team: Mercy Stall, MD as PCP - General (Family Medicine) Phebe Brasil, MD as Consulting Physician (Neurology) Daphine Eagle, MD as Consulting Physician (Psychiatry) Murtis Arthur, OD (Optometry)   Review of Systems  Current Outpatient Medications on File Prior to Visit  Medication Sig Dispense Refill   acetaminophen  (TYLENOL ) 325 MG tablet Take 325 mg by mouth every 6 (six) hours as needed.     ALPRAZolam (XANAX) 1 MG tablet Take 1 mg by mouth in the morning, at noon, in the evening, and at bedtime.      amitriptyline  (ELAVIL ) 25 MG tablet TAKE 1 TABLET BY MOUTH EVERY NIGHT AT BEDTIME 30 tablet 10   COMFORT EZ PEN NEEDLES 31G X 5 MM MISC E11.44 use new needle with each new injection 100 each 2   dicyclomine  (BENTYL ) 10 MG capsule TAKE 1 CAPSULE BY MOUTH FOUR TIMES DAILY BEFORE MEALS AND AT BEDTIME 360 capsule 1   Lancets (ONETOUCH DELICA PLUS LANCET33G) MISC USE TO check blood glucose three times daily AS DIRECTED 100 each 12    lisinopril  (ZESTRIL ) 40 MG tablet TAKE ONE (1) TABLET BY MOUTH TWICE DAILY WITH BREAKFAST AND NIGHTLY AT BEDTIME 60 tablet 10   montelukast  (SINGULAIR ) 10 MG tablet TAKE 1 TABLET BY MOUTH EVERY DAY AT BEDTIME 30 tablet 10   Multiple Vitamins-Minerals (CENTRUM SILVER PO) Take 1 tablet by mouth daily.     NOVOFINE PEN NEEDLE 32G X 6 MM MISC USE AS DIRECTED 100 each 10   NOVOFINE PLUS PEN NEEDLE 32G X 4 MM MISC      ONETOUCH VERIO test strip USE TO check blood glucose three times daily AS DIRECTED 100 strip 12   pregabalin  (LYRICA ) 300 MG capsule Take 1 capsule (300 mg total) by mouth daily. 30 capsule 1   rosuvastatin  (CRESTOR ) 10 MG tablet TAKE 1 TABLET BY MOUTH ONCE DAILY 30 tablet 10   sertraline  (ZOLOFT ) 100 MG tablet Take 1 tablet (100 mg total) by mouth 2 (two) times daily. (Patient taking differently: Take 200 mg by mouth daily.) 180 tablet 1   solifenacin  (VESICARE ) 10 MG tablet Take 1 tablet (  10 mg total) by mouth daily. 90 tablet 1   TRESIBA  FLEXTOUCH 100 UNIT/ML FlexTouch Pen INJECT 65 UNITS SUBCUTANEOUSLY EVERY DAY 15 mL 10   TRULICITY  3 MG/0.5ML SOAJ INJECT 3MG  SUBCUTANEOUSLY ONCE WEEKLY 2 mL 10   Vitamin D , Cholecalciferol, 25 MCG (1000 UT) CAPS Take 2,000 Int'l Units by mouth.     No current facility-administered medications on file prior to visit.   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   lens implants Bilateral     Family History   Adopted: Yes  Problem Relation Age of Onset   Diabetes Maternal Aunt    Social History   Socioeconomic History   Marital status: Married    Spouse name: Not on file   Number of children: 1   Years of education: Not on file   Highest education level: Not on file  Occupational History   Occupation: disabled  Tobacco Use   Smoking status: Former    Current packs/day: 0.00    Types: Cigarettes    Quit date: 2000    Years since quitting: 25.3   Smokeless tobacco: Never  Vaping Use   Vaping status: Never Used  Substance and Sexual Activity   Alcohol use: Yes    Alcohol/week: 1.0 standard drink of alcohol    Types: 1 Glasses of wine per week    Comment: socially   Drug use: Never   Sexual activity: Not on file  Other Topics Concern   Not on file  Social History Narrative   Not on file   Social Drivers of Health   Financial Resource Strain: Low Risk  (04/19/2023)   Overall Financial Resource Strain (CARDIA)    Difficulty of Paying Living Expenses: Not hard at all  Food Insecurity: No Food Insecurity (04/19/2023)   Hunger Vital Sign    Worried About Running Out of Food in the Last Year: Never true    Ran Out of Food in the Last Year: Never true  Transportation Needs: No Transportation Needs (04/19/2023)   PRAPARE - Administrator, Civil Service (Medical): No    Lack of Transportation (Non-Medical): No  Physical Activity: Inactive (04/19/2023)   Exercise Vital Sign    Days of Exercise per Week: 0 days    Minutes of Exercise per Session: 0 min  Stress: No Stress Concern Present (04/19/2023)   [employer] of Occupational Health - Occupational Stress Questionnaire    Feeling of Stress : Not at all  Social Connections: Moderately Isolated (04/19/2023)   Social Connection and Isolation Panel [NHANES]    Frequency of Communication with Friends and Family: More than three times a week    Frequency of Social Gatherings with Friends and Family: More than three  times a week    Attends Religious Services: Never    Database administrator or Organizations: No    Attends  Club or Organization Meetings: Never    Marital Status: Married    Objective:  There were no vitals taken for this visit.     06/20/2023    2:34 PM 06/02/2023    1:50 PM 02/04/2023   11:00 AM  BP/Weight  Systolic BP 138 147 136  Diastolic BP 78 83 80  Wt. (Lbs) 321  325  BMI 42.35 kg/m2  41.95 kg/m2    Physical Exam  Diabetic Foot Exam - Simple   No data filed      Lab Results  Component Value Date   WBC 6.4 06/20/2023   HGB 14.0 06/20/2023   HCT 42.9 06/20/2023   PLT 151 06/20/2023   GLUCOSE 166 (H) 06/20/2023   CHOL 139 06/20/2023   TRIG 133 06/20/2023   HDL 52 06/20/2023   LDLCALC 64 06/20/2023   ALT 27 06/20/2023   AST 34 06/20/2023   NA 141 06/20/2023   K 5.0 06/20/2023   CL 102 06/20/2023   CREATININE 0.73 06/20/2023   BUN 14 06/20/2023   CO2 28 06/20/2023   TSH 2.350 06/02/2023   HGBA1C 6.7 (H) 06/20/2023   MICROALBUR 10 12/02/2020      Assessment & Plan:  There are no diagnoses linked to this encounter.   No orders of the defined types were placed in this encounter.   No orders of the defined types were placed in this encounter.    Follow-up: No follow-ups on file.   I,Katherina A Bramblett,acting as a scribe for Mercy Stall, MD.,have documented all relevant documentation on the behalf of Mercy Stall, MD,as directed by  Mercy Stall, MD while in the presence of Mercy Stall, MD.   An After Visit Summary was printed and given to the patient.  Mercy Stall, MD [facility] [phone]

## 2023-08-10 ENCOUNTER — Ambulatory Visit: Admitting: Family Medicine

## 2023-08-10 ENCOUNTER — Other Ambulatory Visit: Payer: Self-pay | Admitting: Family Medicine

## 2023-08-10 ENCOUNTER — Encounter: Payer: Self-pay | Admitting: Family Medicine

## 2023-08-10 VITALS — BP 130/68 | HR 90 | Temp 98.2°F | Ht 73.0 in

## 2023-08-10 DIAGNOSIS — E1144 Type 2 diabetes mellitus with diabetic amyotrophy: Secondary | ICD-10-CM

## 2023-08-10 MED ORDER — ONETOUCH VERIO VI STRP: ORAL_STRIP | 12 refills | Status: DC

## 2023-08-10 NOTE — Telephone Encounter (Signed)
 Last Fill: 08/05/22  Last OV: 06/20/23 Next OV: 08/10/23  Routing to provider for review/authorization.

## 2023-08-10 NOTE — Assessment & Plan Note (Signed)
 UNABLE TO MOVE HER LEGS INDEPENDENTLY.  ORDER BARIATRIC/LARGE MANUAL WHEELCHAIR.  MAY END UP NEEDING A POW, BUT FOR NOW WISHES TO TRY MANUAL WHEELCHAIR.

## 2023-08-10 NOTE — Telephone Encounter (Signed)
 Copied from CRM [id number]. Topic: Clinical - Medication Refill >> Aug 10, 2023 12:33 PM Sophia H wrote: Medication: Lancets (ONETOUCH DELICA PLUS LANCET33G) MISC  ONETOUCH VERIO test strip   Has the patient contacted their pharmacy? Yes (Agent: If no, request that the patient contact the pharmacy for the refill. If patient does not wish to contact the pharmacy document the reason why and proceed with request.) (Agent: If yes, when and what did the pharmacy advise?)  This is the patient's preferred pharmacy:  [facility], [place] - [address] [place] [address] Phone: [phone] Fax: [phone]  Is this the correct pharmacy for this prescription? Yes If no, delete pharmacy and type the correct one.   Has the prescription been filled recently? Yes  Is the patient out of the medication? Yes  Has the patient been seen for an appointment in the last year OR does the patient have an upcoming appointment? Yes  Can we respond through MyChart? Yes  Agent: Please be advised that Rx refills may take up to 3 business days. We ask that you follow-up with your pharmacy.

## 2023-08-11 ENCOUNTER — Other Ambulatory Visit: Payer: Self-pay

## 2023-08-11 ENCOUNTER — Telehealth: Payer: Self-pay | Admitting: Family Medicine

## 2023-08-11 MED ORDER — ONETOUCH VERIO VI STRP: ORAL_STRIP | 3 refills | Status: DC

## 2023-08-11 NOTE — Telephone Encounter (Signed)
 Rx sent with testing frequency.

## 2023-08-11 NOTE — Telephone Encounter (Signed)
 Copied from CRM [id number]. Topic: Clinical - Prescription Issue >> Aug 11, 2023  2:58 PM Santiya F wrote: Reason for CRM: [facility] is calling in because they need clarity on the onetouch verio test strips , they need to know how frequently patient is supposed to test.  Unable to reach patient to see how many times a day she checks her bgl.  Message forwarded to PCP office for clarification.

## 2023-08-18 ENCOUNTER — Other Ambulatory Visit: Payer: Self-pay | Admitting: Family Medicine

## 2023-08-24 ENCOUNTER — Encounter: Payer: Self-pay | Admitting: Family Medicine

## 2023-08-25 ENCOUNTER — Encounter: Payer: Self-pay | Admitting: Family Medicine

## 2023-09-27 NOTE — Progress Notes (Signed)
 Subjective:  Patient ID: Christy Crawford School, female    DOB: Apr 30, 1955  Age: 68 y.o. MRN: [id number]  Chief Complaint  Patient presents with   Medical Management of Chronic Issues    HPI:  Diabetes:  Complications: Diabetic macular edema. Amyotropy/neuropathy. Glucose checking: Patient checks sugars three times daily. Glucose logs: 76-159 this a.m. was 112 Hypoglycemia: a few readings in the 70's, but patient does not have symptoms or feel like its low.  Most recent A1C: 6.7 Current medications: Also taking Tresiba  65 units qhs, Lyrica  300 mg one tablet twice daily. Helps with sleep due to easing neuropathy. Trulicity  too expensive. Has not taken for 2 months.  Last Eye Exam. Seen 6 weeks ago. Dr. Erasmo referred to doctor here in [place]..Capillaries behind left eye are bleeding Foot checks: Patient checks her feet daily.  Hyperlipidemia: Current medications: Patient is currently taking Rosuvastatin  10 mg take 1 tablet daily.  Hypertension: Current medications: Patient is currently taking Lisinopril  40 mg twice daily.  Depression: Patient is currently taking Zoloft  100 mg twice daily. On xanax 1 mg oral three times daily. Sees Dr Trinidad Aurora every 6 months.   Urge incontinence: on mybetriq 25 mg daily. Doing very well.  Diet: eating very healthy Exercise: Patient is unable to exercise due to Amyotrophy/Neuropathy.  Neurologist-Dr. Onita      08/10/2023    4:17 PM 04/19/2023    1:16 PM 02/04/2023   11:03 AM 10/28/2022    3:03 PM 05/18/2022    1:38 PM  Depression screen PHQ 2/9  Decreased Interest 0 1 0 1 0  Down, Depressed, Hopeless 0 2 0 0 0  PHQ - 2 Score 0 3 0 1 0  Altered sleeping 0 2 1 2 1   Tired, decreased energy 1  1 1 3   Change in appetite 0 1 0 2 1  Feeling bad or failure about yourself  1 0 0 0 1  Trouble concentrating 1  0 0 0  Moving slowly or fidgety/restless 1 2 0 0 1  Suicidal thoughts 0 0 0 0 0  PHQ-9 Score 4 8 2 6 7   Difficult doing work/chores Very  difficult Not difficult at all Not difficult at all Somewhat difficult Somewhat difficult        04/19/2023    1:29 PM  Fall Risk   Falls in the past year? 0  Number falls in past yr: 0  Injury with Fall? 0  Follow up Falls evaluation completed;Education provided;Falls prevention discussed    Patient Care Team: Sherre Clapper, MD as PCP - General (Family Medicine) Onita Duos, MD as Consulting Physician (Neurology) Aurora Grip, MD as Consulting Physician (Psychiatry) Erasmo Bernardino BRAVO, OD (Optometry)   Review of Systems  Constitutional:  Negative for chills, fatigue and fever.  HENT:  Negative for congestion, ear pain and sore throat.   Respiratory:  Negative for cough and shortness of breath.   Cardiovascular:  Negative for chest pain.  Gastrointestinal:  Negative for abdominal pain, constipation, diarrhea, nausea and vomiting.  Genitourinary:  Positive for frequency and urgency. Negative for dysuria.  Musculoskeletal:  Positive for gait problem. Negative for arthralgias and myalgias.  Skin:  Negative for rash.  Neurological:  Positive for weakness and numbness. Negative for dizziness and headaches.  Psychiatric/Behavioral:  Negative for dysphoric mood. The patient is not nervous/anxious.     Current Outpatient Medications on File Prior to Visit  Medication Sig Dispense Refill   acetaminophen  (TYLENOL ) 325 MG tablet Take 325 mg  by mouth every 6 (six) hours as needed.     ALPRAZolam (XANAX) 1 MG tablet Take 1 mg by mouth in the morning, at noon, in the evening, and at bedtime.      amitriptyline  (ELAVIL ) 25 MG tablet TAKE 1 TABLET BY MOUTH EVERY NIGHT AT BEDTIME 30 tablet 10   COMFORT EZ PEN NEEDLES 31G X 5 MM MISC E11.44 use new needle with each new injection 100 each 2   dicyclomine  (BENTYL ) 10 MG capsule TAKE 1 CAPSULE BY MOUTH FOUR TIMES DAILY BEFORE MEALS AND AT BEDTIME 360 capsule 1   glucose blood (ONETOUCH VERIO) test strip Use new strip to test FBS 3 times a day. 300 strip  3   Lancets (ONETOUCH DELICA PLUS LANCET33G) MISC USE TO check blood glucose three times daily AS DIRECTED 100 each 12   lisinopril  (ZESTRIL ) 40 MG tablet TAKE ONE (1) TABLET BY MOUTH TWICE DAILY WITH BREAKFAST AND NIGHTLY AT BEDTIME 60 tablet 10   montelukast  (SINGULAIR ) 10 MG tablet TAKE 1 TABLET BY MOUTH EVERY DAY AT BEDTIME 30 tablet 10   Multiple Vitamins-Minerals (CENTRUM SILVER PO) Take 1 tablet by mouth daily.     NOVOFINE PEN NEEDLE 32G X 6 MM MISC USE AS DIRECTED 100 each 10   NOVOFINE PLUS PEN NEEDLE 32G X 4 MM MISC      pregabalin  (LYRICA ) 300 MG capsule TAKE 1 CAPSULE BY MOUTH DAILY 30 capsule 2   rosuvastatin  (CRESTOR ) 10 MG tablet TAKE 1 TABLET BY MOUTH ONCE DAILY 30 tablet 10   sertraline  (ZOLOFT ) 100 MG tablet Take 1 tablet (100 mg total) by mouth 2 (two) times daily. (Patient taking differently: Take 200 mg by mouth daily.) 180 tablet 1   solifenacin  (VESICARE ) 10 MG tablet Take 1 tablet (10 mg total) by mouth daily. 90 tablet 1   TRESIBA  FLEXTOUCH 100 UNIT/ML FlexTouch Pen INJECT 65 UNITS SUBCUTANEOUSLY EVERY DAY 15 mL 10   TRULICITY  3 MG/0.5ML SOAJ INJECT 3MG  SUBCUTANEOUSLY ONCE WEEKLY 2 mL 10   Vitamin D , Cholecalciferol, 25 MCG (1000 UT) CAPS Take 2,000 Int'l Units by mouth.     No current facility-administered medications on file prior to visit.   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   lens implants Bilateral     Family  History  Adopted: Yes  Problem Relation Age of Onset   Diabetes Maternal Aunt    Social History   Socioeconomic History   Marital status: Married    Spouse name: Not on file   Number of children: 1   Years of education: Not on file   Highest education level: Not on file  Occupational History   Occupation: disabled  Tobacco Use   Smoking status: Former    Current packs/day: 0.00    Types: Cigarettes    Quit date: 2000    Years since quitting: 25.5  Smokeless tobacco: Never  Vaping Use   Vaping status: Never Used  Substance and Sexual Activity   Alcohol use: Yes    Alcohol/week: 1.0 standard drink of alcohol    Types: 1 Glasses of wine per week    Comment: socially   Drug use: Never   Sexual activity: Not on file  Other Topics Concern   Not on file  Social History Narrative   Not on file   Social Drivers of Health   Financial Resource Strain: Low Risk  (04/19/2023)   Overall Financial Resource Strain (CARDIA)    Difficulty of Paying Living Expenses: Not hard at all  Food Insecurity: No Food Insecurity (04/19/2023)   Hunger Vital Sign    Worried About Running Out of Food in the Last Year: Never true    Ran Out of Food in the Last Year: Never true  Transportation Needs: No Transportation Needs (04/19/2023)   PRAPARE - Administrator, Civil Service (Medical): No    Lack of Transportation (Non-Medical): No  Physical Activity: Inactive (04/19/2023)   Exercise Vital Sign    Days of Exercise per Week: 0 days    Minutes of Exercise per Session: 0 min  Stress: No Stress Concern Present (04/19/2023)   [employer] of Occupational Health - Occupational Stress Questionnaire    Feeling of Stress : Not at all  Social Connections: Moderately Isolated (04/19/2023)   Social Connection and Isolation Panel    Frequency of Communication with Friends and Family: More than three times a week    Frequency of Social Gatherings with Friends and Family: More than three times  a week    Attends Religious Services: Never    Database administrator or Organizations: No    Attends Banker Meetings: Never    Marital Status: Married    Objective:  BP 136/78   Pulse 90   Temp 98.2 F (36.8 C)   Ht 6' 1 (1.854 m)   SpO2 98%   BMI 42.35 kg/m      09/28/2023    1:19 PM 08/10/2023    3:06 PM 06/20/2023    2:34 PM  BP/Weight  Systolic BP 136 130 138  Diastolic BP 78 68 78  Wt. (Lbs)  -- 321  BMI   42.35 kg/m2    Physical Exam Vitals reviewed.  Constitutional:      Appearance: Normal appearance. She is obese.  Neck:     Vascular: No carotid bruit.   Cardiovascular:     Rate and Rhythm: Normal rate and regular rhythm.     Heart sounds: Normal heart sounds.  Pulmonary:     Effort: Pulmonary effort is normal. No respiratory distress.     Breath sounds: Normal breath sounds.  Abdominal:     General: Abdomen is flat. Bowel sounds are normal.     Palpations: Abdomen is soft.     Tenderness: There is no abdominal tenderness.   Musculoskeletal:     Right lower leg: Edema present.     Left lower leg: Edema present.   Neurological:     Mental Status: She is alert and oriented to person, place, and time.     Sensory: Sensory deficit present.     Motor: Weakness present.     Coordination: Coordination abnormal.     Gait: Gait abnormal.     Comments: In large wheelchair.   Psychiatric:        Mood and Affect: Mood normal.  Behavior: Behavior normal.      Diabetic foot exam was performed with the following findings:   Intact posterior tibialis and dorsalis pedis pulses No sensation on feet. Mild edema BL.  Thickened nails.       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  Continue Tresiba  65 units qhs, Lyrica  300 mg one tablet twice daily. Recommend meet with our pharmacist to work on getting trulicity  approved on PAP.  Continue to check sugars three times a day.  Still waiting for manual wheelchair.   Orders: -     Hemoglobin A1c -     AMB Referral [facility] Care Management -     Microalbumin / creatinine urine ratio  Essential hypertension, benign -     CBC with Differential/Platelet -     Comprehensive metabolic panel with GFR -     AMB Referral [facility] Care Management  Mixed hyperlipidemia Assessment & Plan: Well controlled.  No changes to medicines. Rosuvastatin  10 mg take 1 tablet daily.  Continue to work on eating a healthy diet and exercise.  Labs drawn today.  Orders: -     Lipid panel -     AMB Referral [facility] Care Management  Fibromyalgia Assessment & Plan: Continue amitriptyline  25 mg before bed.    Other urinary incontinence Assessment & Plan: Improved on myrbetriq .   Orders: -     POCT URINALYSIS DIP (CLINITEK) -     Urine Culture  Acute cystitis with hematuria Assessment & Plan: Prescription cipro  sent.    Other orders -     Fluconazole ; Take 1 tablet (150 mg total) by mouth once for 1 dose.  Dispense: 1 tablet; Refill: 0     Meds ordered this encounter  Medications   fluconazole  (DIFLUCAN ) 150 MG tablet    Sig: Take 1 tablet (150 mg total) by mouth once for 1 dose.    Dispense:  1 tablet    Refill:  0    Orders Placed This Encounter  Procedures   Urine Culture   CBC with Differential/Platelet   Comprehensive metabolic panel with GFR   Hemoglobin A1c   Lipid panel   Microalbumin /  creatinine urine ratio   AMB Referral [facility]BCI Care Management   POCT URINALYSIS DIP (CLINITEK)     Follow-up: No follow-ups on file.   I,Marla I Leal-Borjas,acting as a scribe for Abigail Free, MD.,have documented all relevant documentation on the behalf of Abigail Free, MD,as directed by  Abigail Free, MD while in the presence of Abigail Free, MD.   An After Visit Summary was printed and given to the patient.  Abigail Free, MD [facility] [phone]

## 2023-09-28 ENCOUNTER — Ambulatory Visit: Payer: Self-pay | Admitting: Family Medicine

## 2023-09-28 ENCOUNTER — Encounter: Payer: Self-pay | Admitting: Family Medicine

## 2023-09-28 ENCOUNTER — Ambulatory Visit: Admitting: Family Medicine

## 2023-09-28 VITALS — BP 136/78 | HR 90 | Temp 98.2°F | Ht 73.0 in

## 2023-09-28 DIAGNOSIS — M797 Fibromyalgia: Secondary | ICD-10-CM

## 2023-09-28 DIAGNOSIS — E1144 Type 2 diabetes mellitus with diabetic amyotrophy: Secondary | ICD-10-CM | POA: Diagnosis not present

## 2023-09-28 DIAGNOSIS — I10 Essential (primary) hypertension: Secondary | ICD-10-CM | POA: Diagnosis not present

## 2023-09-28 DIAGNOSIS — R413 Other amnesia: Secondary | ICD-10-CM

## 2023-09-28 DIAGNOSIS — N3001 Acute cystitis with hematuria: Secondary | ICD-10-CM

## 2023-09-28 DIAGNOSIS — E1159 Type 2 diabetes mellitus with other circulatory complications: Secondary | ICD-10-CM | POA: Diagnosis not present

## 2023-09-28 DIAGNOSIS — E782 Mixed hyperlipidemia: Secondary | ICD-10-CM | POA: Diagnosis not present

## 2023-09-28 DIAGNOSIS — N39498 Other specified urinary incontinence: Secondary | ICD-10-CM

## 2023-09-28 DIAGNOSIS — I152 Hypertension secondary to endocrine disorders: Secondary | ICD-10-CM

## 2023-09-28 LAB — POCT URINALYSIS DIP (CLINITEK)
Bilirubin, UA: NEGATIVE
Glucose, UA: NEGATIVE mg/dL
Ketones, POC UA: NEGATIVE mg/dL
Nitrite, UA: NEGATIVE
Spec Grav, UA: 1.01 (ref 1.010–1.025)
Urobilinogen, UA: 0.2 U/dL
pH, UA: 6 (ref 5.0–8.0)

## 2023-09-28 MED ORDER — FLUCONAZOLE 150 MG PO TABS: 150.0000 mg | ORAL_TABLET | Freq: Once | ORAL | 0 refills | Status: AC

## 2023-09-28 MED ORDER — CIPROFLOXACIN HCL 250 MG PO TABS: 250.0000 mg | ORAL_TABLET | Freq: Two times a day (BID) | ORAL | 0 refills | Status: AC

## 2023-09-29 ENCOUNTER — Encounter: Payer: Self-pay | Admitting: Family Medicine

## 2023-09-29 LAB — CBC WITH DIFFERENTIAL/PLATELET
Basophils Absolute: 0.1 10*3/uL (ref 0.0–0.2)
Basos: 1 %
EOS (ABSOLUTE): 0.2 10*3/uL (ref 0.0–0.4)
Eos: 3 %
Hematocrit: 44.4 % (ref 34.0–46.6)
Hemoglobin: 14.4 g/dL (ref 11.1–15.9)
Immature Grans (Abs): 0 10*3/uL (ref 0.0–0.1)
Immature Granulocytes: 0 %
Lymphocytes Absolute: 1.3 10*3/uL (ref 0.7–3.1)
Lymphs: 20 %
MCH: 29.3 pg (ref 26.6–33.0)
MCHC: 32.4 g/dL (ref 31.5–35.7)
MCV: 90 fL (ref 79–97)
Monocytes Absolute: 0.5 10*3/uL (ref 0.1–0.9)
Monocytes: 8 %
Neutrophils Absolute: 4.3 10*3/uL (ref 1.4–7.0)
Neutrophils: 67 %
Platelets: 168 10*3/uL (ref 150–450)
RBC: 4.91 x10E6/uL (ref 3.77–5.28)
RDW: 13.7 % (ref 11.7–15.4)
WBC: 6.4 10*3/uL (ref 3.4–10.8)

## 2023-09-29 LAB — COMPREHENSIVE METABOLIC PANEL WITH GFR
ALT: 19 IU/L (ref 0–32)
AST: 32 IU/L (ref 0–40)
Albumin: 4.2 g/dL (ref 3.9–4.9)
Alkaline Phosphatase: 133 IU/L — ABNORMAL HIGH (ref 44–121)
BUN/Creatinine Ratio: 18 (ref 12–28)
BUN: 14 mg/dL (ref 8–27)
Bilirubin Total: 0.3 mg/dL (ref 0.0–1.2)
CO2: 24 mmol/L (ref 20–29)
Calcium: 9.1 mg/dL (ref 8.7–10.3)
Chloride: 102 mmol/L (ref 96–106)
Creatinine, Ser: 0.8 mg/dL (ref 0.57–1.00)
Globulin, Total: 1.9 g/dL (ref 1.5–4.5)
Glucose: 96 mg/dL (ref 70–99)
Potassium: 4.5 mmol/L (ref 3.5–5.2)
Sodium: 143 mmol/L (ref 134–144)
Total Protein: 6.1 g/dL (ref 6.0–8.5)
eGFR: 80 mL/min/{1.73_m2} (ref >59)

## 2023-09-29 LAB — LIPID PANEL
Chol/HDL Ratio: 2.8 ratio (ref 0.0–4.4)
Cholesterol, Total: 142 mg/dL (ref 100–199)
HDL: 51 mg/dL (ref >39)
LDL Chol Calc (NIH): 69 mg/dL (ref 0–99)
Triglycerides: 123 mg/dL (ref 0–149)
VLDL Cholesterol Cal: 22 mg/dL (ref 5–40)

## 2023-09-29 LAB — HEMOGLOBIN A1C
Est. average glucose Bld gHb Est-mCnc: 180 mg/dL
Hgb A1c MFr Bld: 7.9 % — ABNORMAL HIGH (ref 4.8–5.6)

## 2023-09-30 LAB — MICROALBUMIN / CREATININE URINE RATIO
Creatinine, Urine: 69 mg/dL
Microalb/Creat Ratio: 180 mg/g{creat} — ABNORMAL HIGH (ref 0–29)
Microalbumin, Urine: 124.2 ug/mL

## 2023-10-02 DIAGNOSIS — N3001 Acute cystitis with hematuria: Secondary | ICD-10-CM | POA: Insufficient documentation

## 2023-10-02 NOTE — Assessment & Plan Note (Signed)
 Prescription cipro  sent.

## 2023-10-02 NOTE — Assessment & Plan Note (Signed)
Continue amitriptyline 25 mg before bed.  

## 2023-10-02 NOTE — Assessment & Plan Note (Signed)
 Diabetes worsened.  Continue Tresiba  65 units qhs, Lyrica  300 mg one tablet twice daily. Recommend meet with our pharmacist to work on getting trulicity  approved on PAP.  Continue to check sugars three times a day.  Still waiting for manual wheelchair.

## 2023-10-02 NOTE — Assessment & Plan Note (Signed)
Improved on myrbetriq

## 2023-10-02 NOTE — Assessment & Plan Note (Signed)
Well controlled.  No changes to medicines. Rosuvastatin 10 mg take 1 tablet daily.  Continue to work on eating a healthy diet and exercise.  Labs drawn today. 

## 2023-10-02 NOTE — Assessment & Plan Note (Signed)
 Chjeck labs.

## 2023-10-04 LAB — URINE CULTURE

## 2023-10-10 ENCOUNTER — Other Ambulatory Visit: Payer: Self-pay | Admitting: Family Medicine

## 2023-10-19 ENCOUNTER — Telehealth: Payer: Self-pay | Admitting: Family Medicine

## 2023-10-19 NOTE — Telephone Encounter (Signed)
 [facility]

## 2023-10-20 ENCOUNTER — Other Ambulatory Visit: Payer: Self-pay | Admitting: Family Medicine

## 2023-10-20 ENCOUNTER — Telehealth: Payer: Self-pay

## 2023-10-20 MED ORDER — MIRABEGRON ER 25 MG PO TB24: 25.0000 mg | ORAL_TABLET | Freq: Every day | ORAL | 0 refills | Status: DC

## 2023-10-20 NOTE — Progress Notes (Signed)
 Complex Care Management Note Care Guide Note  10/20/2023 Name: Christy Crawford MRN: [id number] DOB: December 25, 1955   Complex Care Management Outreach Attempts: An unsuccessful telephone outreach was attempted today to offer the patient information about available complex care management services.  Follow Up Plan:  Additional outreach attempts will be made to offer the patient complex care management information and services.   Encounter Outcome:  No Answer  Dreama [facility]  [facility], [facility] Care Management Assistant Direct Dial: [phone]  Fax: [phone]

## 2023-10-20 NOTE — Telephone Encounter (Unsigned)
 Copied from CRM [id number]. Topic: Clinical - Medication Question >> Oct 20, 2023 12:51 PM Donna BRAVO wrote: Reason for CRM:  Crystal with [facility] [phone] ext [phone] Patient doesn't understand why she is take finasteride  (PROSCAR ) 5 MG tablet  Patient wants to switch back from solifenacin  (VESICARE ) 10 MG tablet to MYRBETRIQ  25 MG TB24 tablet  Crystal sent fax 10/18/23 and will refax again 10/20/23  Crystal asked for patient next appt

## 2023-10-24 ENCOUNTER — Telehealth: Payer: Self-pay

## 2023-10-24 NOTE — Telephone Encounter (Unsigned)
 Spoke to [employer] and gave her information below from provider.  Copied from CRM [id number]. Topic: Clinical - Medication Question >> Oct 24, 2023 12:44 PM Fonda T wrote: Reason for CRM: Reason for CRM: Received call from [facility] with [facility], [phone], ext [phone], calling to check status of previous fax regarding medications, Solifenacin , to change to a safer alternative medication. And Finasteride , as patient is unsure as to why she is taking this medication.   Per Crystal faxed Thursday 10/20/23, regarding problems identified, and recommendations and requests.   Please call back to discuss further at above contact number.  Thank you

## 2023-10-26 NOTE — Progress Notes (Signed)
 Complex Care Management Note Care Guide Note  10/26/2023 Name: Christy Crawford MRN: [id number] DOB: 1955/09/12   Complex Care Management Outreach Attempts: A second unsuccessful outreach was attempted today to offer the patient with information about available complex care management services.  Follow Up Plan:  Additional outreach attempts will be made to offer the patient complex care management information and services.   Encounter Outcome:  No Answer  [facility]  [facility], [facility] Care Management Assistant Direct Dial: [phone]  Fax: [phone]

## 2023-10-27 NOTE — Telephone Encounter (Signed)
 Called Christy Crawford left message for her to call back with my direct number

## 2023-10-28 NOTE — Telephone Encounter (Signed)
 Copied from CRM [id number]. Topic: Clinical - Medical Advice >> Oct 28, 2023 11:30 AM Sophia H wrote: Reason for CRM: Patient states a couple of weeks ago she had a UTI and was given a 7 day prescription but it didn't start working till around the 5th day and did clear up for a week or so but feels like it has come back full force. Patient is wanting to know if there is anything else that can be called in to help, either stronger or longer to take. Please advise # [phone] (husbands phone, patient is having issues with hers)  [id number] - [place], [place] - [address]

## 2023-10-31 ENCOUNTER — Other Ambulatory Visit: Payer: Self-pay | Admitting: Family Medicine

## 2023-10-31 MED ORDER — CIPROFLOXACIN HCL 250 MG PO TABS: 250.0000 mg | ORAL_TABLET | Freq: Two times a day (BID) | ORAL | 0 refills | Status: DC

## 2023-10-31 NOTE — Progress Notes (Signed)
 Complex Care Management Note Care Guide Note  10/31/2023 Name: Christy Crawford MRN: [id number] DOB: Sep 08, 1955   Complex Care Management Outreach Attempts: A third unsuccessful outreach was attempted today to offer the patient with information about available complex care management services.  Follow Up Plan:  No further outreach attempts will be made at this time. We have been unable to contact the patient to offer or enroll patient in complex care management services.  Encounter Outcome:  No Answer  [facility]ream[facility]  [facility], [facility] Health Care Management Assistant Direct Dial: [phone]  Fax: [phone]

## 2023-11-08 ENCOUNTER — Other Ambulatory Visit: Payer: Self-pay | Admitting: Family Medicine

## 2023-11-08 NOTE — Telephone Encounter (Unsigned)
 Copied from CRM #[id number]. Topic: Clinical - Medication Refill >> Nov 08, 2023  4:46 PM Turkey B wrote: Medication: mirabegron  ER (MYRBETRIQ ) 25 MG TB24 tablet  Has the patient contacted their pharmacy? Yes  (Agent: If yes, when and what did the pharmacy advise?)[facility] for patient   [id number] [place], [place] - [address] [place] [address] Phone: [phone] Fax: [phone]  Is this the correct pharmacy for this prescription? yes .   Has the prescription been filled recently? No Med was [facility] to [facility]  Is the patient out of the medication? no  Has the patient been seen for an appointment in the last year OR does the patient have an upcoming appointment? yes  Can we respond through MyChart? yes  Agent: Please be advised that Rx refills may take up to 3 business days. We ask that you follow-up with your pharmacy.

## 2023-11-10 MED ORDER — MIRABEGRON ER 25 MG PO TB24: 25.0000 mg | ORAL_TABLET | Freq: Every day | ORAL | 0 refills | Status: DC

## 2023-11-17 ENCOUNTER — Other Ambulatory Visit: Payer: Self-pay | Admitting: Family Medicine

## 2023-11-28 ENCOUNTER — Telehealth (INDEPENDENT_AMBULATORY_CARE_PROVIDER_SITE_OTHER): Payer: No Typology Code available for payment source | Admitting: Neurology

## 2023-11-28 DIAGNOSIS — R269 Unspecified abnormalities of gait and mobility: Secondary | ICD-10-CM

## 2023-11-28 DIAGNOSIS — R531 Weakness: Secondary | ICD-10-CM | POA: Diagnosis not present

## 2023-11-28 DIAGNOSIS — R32 Unspecified urinary incontinence: Secondary | ICD-10-CM | POA: Diagnosis not present

## 2023-11-28 NOTE — Progress Notes (Signed)
 Chief Complaint  Patient presents with   Gait Problem    Pt husband stated that pt has rollator and that is very helpful .     Urinary Incontinence    Pt husband stated that she just finished cipro  for uti    Memory Loss    Pt husband stated that her memory has been better lately    Follow-up    Called and spoke to pt husband and informed Dr. Onita is running behind. Pt husband was understanding.     ASSESSMENT AND PLAN  Christy Crawford is a 68 y.o. 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  DIAGNOSTIC DATA (LABS, IMAGING, TESTING) - I reviewed patient records, labs, notes, testing and imaging myself where available.   MEDICAL HISTORY:  Christy Crawford is a 68 year old female, seen in request by her primary care from [facility] Dr. Sherre Clapper, for evaluation of subacute onset lower extremity deep achy pain, weakness  History is obtained from the patient and review of electronic medical records. I personally reviewed pertinent available imaging films in PACS.   PMHx of  HTN DM insulin dependent for more than 30 years, PN,  HLD Depression,  Obesity Hard of hearing,   She suffered significant depression since  she lost her younger daughter in 2011, husband was a truck driver, she went on the road with her husband, shortly afterwards she noticed  difficulty getting in and out of truck, very painful from lower back to hip going down her leg to her ankle and feet, per patient,  She already had bilateral feet numbness from diabetic neuropathy for many years,  Since she developed weakness she had extensive evaluation,  she was given the diagnosis of diabetic amyotrophy, she was not offered any treatment, she has no significant improvement from that weakness, but no worsening, at baseline, she was still able to take a few steps with walker transfer herself  Since July 2024 she noticed acute worsening with background of poor functional status, right hand numbness tingling, radiating pain from neck to occipital region, to her jawline, also to right shoulder, low back pain to bilateral lower extremity,  Also developed bowel and bladder incontinence,  Virtual Visit via Video on August 25th 2025:  I connected with [name] on 11/28/23 at  by Video and verified that I am speaking with the correct person using two identifiers.  Location: Physician, office, Patient with her husband at home   I discussed the limitations, risks, security and privacy concerns of performing an evaluation and management service by video and the availability of in person appointments. I also discussed with the patient that there may be a patient responsible charge related to this service. The patient expressed understanding and agreed to proceed.  History of Present Illness: She is overall stable, lives at home with her husband, need help getting up and down, walking with walker  In June 2025, she suffered E. 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 Awake, alert, oriented to history taking and casual conversation, facial symmetric, no dysarthria, no aphasia, tired looking elderly female, able to pull herself up, unsteady gait with walker      I discussed the assessment and treatment plan with the patient. The patient was provided an opportunity to ask questions and all were answered. The patient agreed with the plan and demonstrated an understanding of the instructions.   The patient was advised to call back or seek an in-person evaluation if the symptoms worsen or if the condition fails to improve as anticipated.  I provided 30 minutes of non-face-to-face time during this encounter.   [name], MD    REVIEW OF SYSTEMS:  Full 14 system review of systems performed and notable only for as above All other review of systems were negative.   ALLERGIES: No Known Allergies  HOME MEDICATIONS: Current Outpatient Medications  Medication Sig Dispense Refill   acetaminophen  (TYLENOL ) 325 MG tablet Take 325 mg by mouth every 6 (six) hours as needed.     ALPRAZolam (XANAX) 1 MG tablet Take 1 mg by mouth 3 (three) times daily.     amitriptyline  (ELAVIL ) 25 MG tablet TAKE 1 TABLET BY MOUTH EVERY NIGHT AT BEDTIME 30 tablet 10   COMFORT EZ PEN NEEDLES 31G X 5 MM MISC E11.44 use new needle with each new injection 100 each 2   dicyclomine  (BENTYL ) 10 MG capsule TAKE 1 CAPSULE BY MOUTH FOUR TIMES DAILY BEFORE MEALS AND AT BEDTIME 360 capsule 1   finasteride  (PROSCAR ) 5 MG tablet Take 5 mg by mouth daily.     glucose blood (ONETOUCH VERIO) test strip Use new strip to test FBS 3 times a day. 300 strip 3   Lancets (ONETOUCH DELICA PLUS LANCET33G) MISC USE TO check blood glucose three  times daily AS DIRECTED 100 each 12   lisinopril  (ZESTRIL ) 40 MG tablet TAKE ONE (1) TABLET BY MOUTH TWICE DAILY WITH BREAKFAST AND NIGHTLY AT BEDTIME 60 tablet 10   mirabegron  ER (MYRBETRIQ ) 25 MG TB24 tablet Take 1 tablet (25 mg total) by mouth daily. 90 tablet 0   montelukast  (SINGULAIR ) 10 MG tablet TAKE 1 TABLET BY MOUTH EVERY DAY AT BEDTIME 30 tablet 10   NOVOFINE PEN NEEDLE 32G X 6 MM MISC USE AS DIRECTED 100 each 10   NOVOFINE PLUS PEN NEEDLE 32G X 4 MM MISC      pregabalin  (LYRICA ) 300 MG capsule TAKE 1 CAPSULE BY MOUTH DAILY 90 capsule 1   rosuvastatin  (CRESTOR ) 10 MG tablet TAKE 1 TABLET BY MOUTH ONCE DAILY 30 tablet 10   sertraline  (ZOLOFT ) 100 MG tablet Take 1 tablet (100 mg total) by mouth 2 (two) times daily. 180 tablet 1   TRESIBA  FLEXTOUCH 100 UNIT/ML FlexTouch Pen INJECT 65 UNITS SUBCUTANEOUSLY EVERY DAY 15 mL 10   Vitamin D , Cholecalciferol, 25 MCG (1000 UT) CAPS Take 2,000 Int'l Units by mouth.     No current facility-administered medications for this visit.    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   lens implants Bilateral     FAMILY HISTORY: Family History  Adopted: Yes  Problem Relation Age of Onset   Diabetes Maternal Aunt     SOCIAL HISTORY: Social History   Socioeconomic History   Marital status: Married    Spouse name: Not on file   Number of children: 1    Years of education: Not on file   Highest education level: Not on file  Occupational History   Occupation: disabled  Tobacco Use   Smoking status: Former    Current packs/day: 0.00    Types: Cigarettes    Quit date: 2000    Years since quitting: 25.6   Smokeless tobacco: Never  Vaping Use   Vaping status: Never Used  Substance and Sexual Activity   Alcohol use: Yes    Alcohol/week: 1.0 standard drink of alcohol    Types: 1 Glasses of wine per week    Comment: socially   Drug use: Never   Sexual activity: Not on file  Other Topics Concern   Not on file  Social History Narrative   Not on file   Social Drivers of Health   Financial Resource Strain: Low Risk  (04/19/2023)   Overall Financial Resource Strain (CARDIA)    Difficulty of Paying Living Expenses: Not hard at all  Food Insecurity: No Food Insecurity (04/19/2023)   Hunger Vital Sign    Worried About Running Out of Food in the Last Year: Never true    Ran Out of Food in the Last Year: Never true  Transportation Needs: No Transportation Needs (04/19/2023)   PRAPARE - Administrator, Civil Service (Medical): No    Lack of Transportation (Non-Medical): No  Physical Activity: Inactive (04/19/2023)   Exercise Vital Sign    Days of Exercise per Week: 0 days    Minutes of Exercise per Session: 0 min  Stress: No Stress Concern Present (04/19/2023)   [employer] of Occupational Health - Occupational Stress Questionnaire    Feeling of Stress : Not at all  Social Connections: Moderately Isolated (04/19/2023)   Social Connection and Isolation Panel    Frequency of Communication with Friends and Family: More than three times a week    Frequency of Social Gatherings with Friends and Family: More than three times a week    Attends Religious Services: Never    Database administrator or Organizations: No    Attends Banker Meetings: Never    Marital Status: Married  Catering manager Violence: Not At Risk  (04/19/2023)   Humiliation, Afraid, Rape, and Kick questionnaire    Fear of Current or Ex-Partner: No    Emotionally Abused: No    Physically Abused: No    Sexually Abused: No      [name],  M.D. Ph.D.  [facility] [address], [address] [place], [place] [address] Ph: [phone] Fax: [phone]  CC:  Sherre Clapper, MD [address] [place],  [place] [address]  Sherre Clapper, MD

## 2024-01-01 NOTE — Assessment & Plan Note (Signed)
 SABRA

## 2024-01-01 NOTE — Assessment & Plan Note (Signed)
 Christy Crawford

## 2024-01-01 NOTE — Progress Notes (Unsigned)
 Subjective:  Patient ID: Christy Crawford, female    DOB: 07-17-1955  Age: 68 y.o. MRN: [id number]  No chief complaint on file.   HPI: Discussed the use of AI scribe software for clinical note transcription with the patient, who gave verbal consent to proceed.  History of Present Illness        08/10/2023    4:17 PM 04/19/2023    1:16 PM 02/04/2023   11:03 AM 10/28/2022    3:03 PM 05/18/2022    1:38 PM  Depression screen PHQ 2/9  Decreased Interest 0 1 0 1 0  Down, Depressed, Hopeless 0 2 0 0 0  PHQ - 2 Score 0 3 0 1 0  Altered sleeping 0 2 1 2 1   Tired, decreased energy 1  1 1 3   Change in appetite 0 1 0 2 1  Feeling bad or failure about yourself  1 0 0 0 1  Trouble concentrating 1  0 0 0  Moving slowly or fidgety/restless 1 2 0 0 1  Suicidal thoughts 0 0 0 0 0  PHQ-9 Score 4 8 2 6 7   Difficult doing work/chores Very difficult Not difficult at all Not difficult at all Somewhat difficult Somewhat difficult        04/19/2023    1:29 PM  Fall Risk   Falls in the past year? 0  Number falls in past yr: 0  Injury with Fall? 0  Follow up Falls evaluation completed;Education provided;Falls prevention discussed    Patient Care Team: Sherre Clapper, MD as PCP - General (Family Medicine) Onita Duos, MD as Consulting Physician (Neurology) Vincente Grip, MD as Consulting Physician (Psychiatry) Erasmo Bernardino BRAVO, OD (Optometry)   Review of Systems  Current Outpatient Medications on File Prior to Visit  Medication Sig Dispense Refill   acetaminophen  (TYLENOL ) 325 MG tablet Take 325 mg by mouth every 6 (six) hours as needed.     ALPRAZolam (XANAX) 1 MG tablet Take 1 mg by mouth 3 (three) times daily.     amitriptyline  (ELAVIL ) 25 MG tablet TAKE 1 TABLET BY MOUTH EVERY NIGHT AT BEDTIME 30 tablet 10   COMFORT EZ PEN NEEDLES 31G X 5 MM MISC E11.44 use new needle with each new injection 100 each 2   dicyclomine  (BENTYL ) 10 MG capsule TAKE 1 CAPSULE BY MOUTH FOUR TIMES DAILY BEFORE MEALS  AND AT BEDTIME 360 capsule 1   finasteride  (PROSCAR ) 5 MG tablet Take 5 mg by mouth daily.     glucose blood (ONETOUCH VERIO) test strip Use new strip to test FBS 3 times a day. 300 strip 3   Lancets (ONETOUCH DELICA PLUS LANCET33G) MISC USE TO check blood glucose three times daily AS DIRECTED 100 each 12   lisinopril  (ZESTRIL ) 40 MG tablet TAKE ONE (1) TABLET BY MOUTH TWICE DAILY WITH BREAKFAST AND NIGHTLY AT BEDTIME 60 tablet 10   mirabegron  ER (MYRBETRIQ ) 25 MG TB24 tablet Take 1 tablet (25 mg total) by mouth daily. 90 tablet 0   montelukast  (SINGULAIR ) 10 MG tablet TAKE 1 TABLET BY MOUTH EVERY DAY AT BEDTIME 30 tablet 10   NOVOFINE PEN NEEDLE 32G X 6 MM MISC USE AS DIRECTED 100 each 10   NOVOFINE PLUS PEN NEEDLE 32G X 4 MM MISC      pregabalin  (LYRICA ) 300 MG capsule TAKE 1 CAPSULE BY MOUTH DAILY 90 capsule 1   rosuvastatin  (CRESTOR ) 10 MG tablet TAKE 1 TABLET BY MOUTH ONCE DAILY 30 tablet 10  sertraline  (ZOLOFT ) 100 MG tablet Take 1 tablet (100 mg total) by mouth 2 (two) times daily. 180 tablet 1   TRESIBA  FLEXTOUCH 100 UNIT/ML FlexTouch Pen INJECT 65 UNITS SUBCUTANEOUSLY EVERY DAY 15 mL 10   Vitamin D , Cholecalciferol, 25 MCG (1000 UT) CAPS Take 2,000 Int'l Units by mouth.     No current facility-administered medications on file prior to visit.   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   lens implants Bilateral     Family History  Adopted: Yes   Problem Relation Age of Onset   Diabetes Maternal Aunt    Social History   Socioeconomic History   Marital status: Married    Spouse name: Not on file   Number of children: 1   Years of education: Not on file   Highest education level: Not on file  Occupational History   Occupation: disabled  Tobacco Use   Smoking status: Former    Current packs/day: 0.00    Types: Cigarettes    Quit date: 2000    Years since quitting: 25.7   Smokeless tobacco: Never  Vaping Use   Vaping status: Never Used  Substance and Sexual Activity   Alcohol use: Yes    Alcohol/week: 1.0 standard drink of alcohol    Types: 1 Glasses of wine per week    Comment: socially   Drug use: Never   Sexual activity: Not on file  Other Topics Concern   Not on file  Social History Narrative   Not on file   Social Drivers of Health   Financial Resource Strain: Low Risk  (04/19/2023)   Overall Financial Resource Strain (CARDIA)    Difficulty of Paying Living Expenses: Not hard at all  Food Insecurity: No Food Insecurity (04/19/2023)   Hunger Vital Sign    Worried About Running Out of Food in the Last Year: Never true    Ran Out of Food in the Last Year: Never true  Transportation Needs: No Transportation Needs (04/19/2023)   PRAPARE - Administrator, Civil Service (Medical): No    Lack of Transportation (Non-Medical): No  Physical Activity: Inactive (04/19/2023)   Exercise Vital Sign    Days of Exercise per Week: 0 days    Minutes of Exercise per Session: 0 min  Stress: No Stress Concern Present (04/19/2023)   [employer] of Occupational Health - Occupational Stress Questionnaire    Feeling of Stress : Not at all  Social Connections: Moderately Isolated (04/19/2023)   Social Connection and Isolation Panel    Frequency of Communication with Friends and Family: More than three times a week    Frequency of Social Gatherings with Friends and Family: More than three times a week    Attends  Religious Services: Never    Database administrator or Organizations: No    Attends Banker Meetings: Never  Marital Status: Married    Objective:  There were no vitals taken for this visit.     09/28/2023    1:19 PM 08/10/2023    3:06 PM 06/20/2023    2:34 PM  BP/Weight  Systolic BP 136 130 138  Diastolic BP 78 68 78  Wt. (Lbs)  -- 321  BMI   42.35 kg/m2    Physical Exam  {Perform Simple Foot Exam  Perform Detailed exam:1} {Insert foot Exam (Optional):30965}   Lab Results  Component Value Date   WBC 6.4 09/28/2023   HGB 14.4 09/28/2023   HCT 44.4 09/28/2023   PLT 168 09/28/2023   GLUCOSE 96 09/28/2023   CHOL 142 09/28/2023   TRIG 123 09/28/2023   HDL 51 09/28/2023   LDLCALC 69 09/28/2023   ALT 19 09/28/2023   AST 32 09/28/2023   NA 143 09/28/2023   K 4.5 09/28/2023   CL 102 09/28/2023   CREATININE 0.80 09/28/2023   BUN 14 09/28/2023   CO2 24 09/28/2023   TSH 2.350 06/02/2023   HGBA1C 7.9 (H) 09/28/2023    Results for orders placed or performed in visit on 09/28/23  CBC with Differential/Platelet   Collection Time: 09/28/23  1:55 PM  Result Value Ref Range   WBC 6.4 3.4 - 10.8 x10E3/uL   RBC 4.91 3.77 - 5.28 x10E6/uL   Hemoglobin 14.4 11.1 - 15.9 g/dL   Hematocrit 55.5 65.9 - 46.6 %   MCV 90 79 - 97 fL   MCH 29.3 26.6 - 33.0 pg   MCHC 32.4 31.5 - 35.7 g/dL   RDW 86.2 88.2 - 84.5 %   Platelets 168 150 - 450 x10E3/uL   Neutrophils 67 Not Estab. %   Lymphs 20 Not Estab. %   Monocytes 8 Not Estab. %   Eos 3 Not Estab. %   Basos 1 Not Estab. %   Neutrophils Absolute 4.3 1.4 - 7.0 x10E3/uL   Lymphocytes Absolute 1.3 0.7 - 3.1 x10E3/uL   Monocytes Absolute 0.5 0.1 - 0.9 x10E3/uL   EOS (ABSOLUTE) 0.2 0.0 - 0.4 x10E3/uL   Basophils Absolute 0.1 0.0 - 0.2 x10E3/uL   Immature Granulocytes 0 Not Estab. %   Immature Grans (Abs) 0.0 0.0 - 0.1 x10E3/uL  Comprehensive metabolic panel with GFR   Collection Time: 09/28/23  1:55 PM  Result Value  Ref Range   Glucose 96 70 - 99 mg/dL   BUN 14 8 - 27 mg/dL   Creatinine, Ser 9.19 0.57 - 1.00 mg/dL   eGFR 80 >40 fO/fpw/8.26   BUN/Creatinine Ratio 18 12 - 28   Sodium 143 134 - 144 mmol/L   Potassium 4.5 3.5 - 5.2 mmol/L   Chloride 102 96 - 106 mmol/L   CO2 24 20 - 29 mmol/L   Calcium  9.1 8.7 - 10.3 mg/dL   Total Protein 6.1 6.0 - 8.5 g/dL   Albumin 4.2 3.9 - 4.9 g/dL   Globulin, Total 1.9 1.5 - 4.5 g/dL   Bilirubin Total 0.3 0.0 - 1.2 mg/dL   Alkaline Phosphatase 133 (H) 44 - 121 IU/L   AST 32 0 - 40 IU/L   ALT 19 0 - 32 IU/L  Hemoglobin A1c   Collection Time: 09/28/23  1:55 PM  Result Value Ref Range   Hgb A1c MFr Bld 7.9 (H) 4.8 - 5.6 %   Est. average glucose Bld gHb Est-mCnc 180 mg/dL  Lipid panel   Collection Time: 09/28/23  1:55 PM  Result Value Ref Range  Cholesterol, Total 142 100 - 199 mg/dL   Triglycerides 876 0 - 149 mg/dL   HDL 51 >60 mg/dL   VLDL Cholesterol Cal 22 5 - 40 mg/dL   LDL Chol Calc (NIH) 69 0 - 99 mg/dL   Chol/HDL Ratio 2.8 0.0 - 4.4 ratio  Microalbumin / creatinine urine ratio   Collection Time: 09/28/23  2:20 PM  Result Value Ref Range   Creatinine, Urine 69.0 Not Estab. mg/dL   Microalbumin, Urine 875.7 Not Estab. ug/mL   Microalb/Creat Ratio 180 (H) 0 - 29 mg/g creat  POCT URINALYSIS DIP (CLINITEK)   Collection Time: 09/28/23  2:31 PM  Result Value Ref Range   Color, UA yellow yellow   Clarity, UA hazy (A) clear   Glucose, UA negative negative mg/dL   Bilirubin, UA negative negative   Ketones, POC UA negative negative mg/dL   Spec Grav, UA 8.989 8.989 - 1.025   Blood, UA small (A) negative   pH, UA 6.0 5.0 - 8.0   POC PROTEIN,UA trace negative, trace   Urobilinogen, UA 0.2 0.2 or 1.0 E.U./dL   Nitrite, UA Negative Negative   Leukocytes, UA Large (3+) (A) Negative  Urine Culture   Collection Time: 09/28/23  2:37 PM   Specimen: Urine   UR  Result Value Ref Range   Urine Culture, Routine Final report (A)    Organism ID, Bacteria  Escherichia coli (A)    Antimicrobial Susceptibility Comment   .  Assessment & Plan:   Assessment & Plan Mixed hyperlipidemia     Diabetic amyotrophy associated with type 2 diabetes mellitus (HCC)       There is no height or weight on file to calculate BMI.  Assessment and Plan Assessment & Plan      No orders of the defined types were placed in this encounter.   No orders of the defined types were placed in this encounter.      Follow-up: No follow-ups on file.  An After Visit Summary was printed and given to the patient.  Abigail Free, MD [facility] [phone]

## 2024-01-02 ENCOUNTER — Encounter: Payer: Self-pay | Admitting: Family Medicine

## 2024-01-02 ENCOUNTER — Ambulatory Visit: Admitting: Family Medicine

## 2024-01-02 ENCOUNTER — Encounter: Admitting: Family Medicine

## 2024-01-02 VITALS — BP 138/82 | HR 92 | Temp 98.0°F | Ht 73.0 in | Wt 320.0 lb

## 2024-01-02 VITALS — BP 128/74 | HR 76 | Temp 98.2°F | Ht 73.0 in

## 2024-01-02 DIAGNOSIS — E66813 Obesity, class 3: Secondary | ICD-10-CM

## 2024-01-02 DIAGNOSIS — I890 Lymphedema, not elsewhere classified: Secondary | ICD-10-CM

## 2024-01-02 DIAGNOSIS — F330 Major depressive disorder, recurrent, mild: Secondary | ICD-10-CM

## 2024-01-02 DIAGNOSIS — N3941 Urge incontinence: Secondary | ICD-10-CM | POA: Insufficient documentation

## 2024-01-02 DIAGNOSIS — K7469 Other cirrhosis of liver: Secondary | ICD-10-CM | POA: Insufficient documentation

## 2024-01-02 DIAGNOSIS — E1144 Type 2 diabetes mellitus with diabetic amyotrophy: Secondary | ICD-10-CM | POA: Diagnosis not present

## 2024-01-02 DIAGNOSIS — E782 Mixed hyperlipidemia: Secondary | ICD-10-CM | POA: Diagnosis not present

## 2024-01-02 DIAGNOSIS — Z6841 Body Mass Index (BMI) 40.0 and over, adult: Secondary | ICD-10-CM | POA: Diagnosis not present

## 2024-01-02 DIAGNOSIS — N3001 Acute cystitis with hematuria: Secondary | ICD-10-CM | POA: Diagnosis not present

## 2024-01-02 DIAGNOSIS — R197 Diarrhea, unspecified: Secondary | ICD-10-CM | POA: Diagnosis not present

## 2024-01-02 DIAGNOSIS — I10 Essential (primary) hypertension: Secondary | ICD-10-CM

## 2024-01-02 LAB — POCT URINALYSIS DIP (CLINITEK)
Bilirubin, UA: NEGATIVE
Glucose, UA: NEGATIVE mg/dL
Ketones, POC UA: NEGATIVE mg/dL
Nitrite, UA: NEGATIVE
POC PROTEIN,UA: 30 — AB
Spec Grav, UA: 1.015 (ref 1.010–1.025)
Urobilinogen, UA: 0.2 U/dL
pH, UA: 6 (ref 5.0–8.0)

## 2024-01-02 LAB — POCT GLYCOSYLATED HEMOGLOBIN (HGB A1C): HbA1c POC (<> result, manual entry): 8.2 % (ref 4.0–5.6)

## 2024-01-02 MED ORDER — MIRABEGRON ER 50 MG PO TB24: 50.0000 mg | ORAL_TABLET | Freq: Every day | ORAL | 1 refills | Status: AC

## 2024-01-02 MED ORDER — DICYCLOMINE HCL 20 MG PO TABS: 20.0000 mg | ORAL_TABLET | Freq: Three times a day (TID) | ORAL | 1 refills | Status: DC

## 2024-01-02 MED ORDER — CIPROFLOXACIN HCL 500 MG PO TABS: 500.0000 mg | ORAL_TABLET | Freq: Two times a day (BID) | ORAL | 0 refills | Status: AC

## 2024-01-02 NOTE — Assessment & Plan Note (Addendum)
 Noted on ct scan at [facility] in 2023. Add on hepatitis panel.  Consider repeat ct scan of abdomen.

## 2024-01-02 NOTE — Assessment & Plan Note (Addendum)
 Refer to [facility] center.   Orders:   Comprehensive metabolic panel with GFR   TSH   Ambulatory referral to Physical Therapy

## 2024-01-02 NOTE — Assessment & Plan Note (Addendum)
-   Increase myrbetriq  50 mg daily.  Orders:   mirabegron  ER (MYRBETRIQ ) 50 MG TB24 tablet; Take 1 tablet (50 mg total) by mouth daily.

## 2024-01-02 NOTE — Assessment & Plan Note (Addendum)
-   IBS-D suspected.  - Cipro  500 mg twice daily x 14 days. My preference would be for treatment with xifaxin, but cost and insurance coverage are often an issue.  - Increase dicyclomine  20 mg before meals and before bed.   Orders:   Ambulatory referral to Gastroenterology

## 2024-01-02 NOTE — Assessment & Plan Note (Addendum)
 The current medical regimen is effective;  continue present plan and medications. Continue Zoloft  100 mg two daily and xanax 1 mg oral three times a day. Sees Dr Trinidad Aurora every 6 months.

## 2024-01-02 NOTE — Assessment & Plan Note (Addendum)
 Suboptimal control due to Trulicity  discontinuation. A1c increased to 8.2. Awaiting requalification for Trulicity . - Assist with paperwork for Trulicity  requalification through [employer]. - She will need to start on trulicity  0.75 mg weekly and then gradually incrase.  - Monitor blood glucose levels regularly. - Recommend increase tresiba  70 U weekly.

## 2024-01-02 NOTE — Assessment & Plan Note (Addendum)
 Well controlled.  Continue LISINOPRIL  40 MG twice DAILY.  Continue to work on eating a healthy diet and exercise.  Labs drawn today.  Orders:   CBC with Differential/Platelet

## 2024-01-02 NOTE — Assessment & Plan Note (Addendum)
 Well controlled.  No changes to medicines. Continue Rosuvastatin  10 mg take 1 tablet daily. Continue to work on eating a healthy diet and exercise.   Orders:   Comprehensive metabolic panel with GFR

## 2024-01-02 NOTE — Assessment & Plan Note (Addendum)
 Restart trulicity.  Recommend continue to work on eating healthy diet and exercise.

## 2024-01-02 NOTE — Assessment & Plan Note (Addendum)
 Prescription cipro  500 mg twice daily x 7 days. Orders:   POCT URINALYSIS DIP (CLINITEK)   Urine Culture   Microalbumin / creatinine urine ratio

## 2024-01-02 NOTE — Patient Instructions (Addendum)
  VISIT SUMMARY: Today, we addressed your high blood sugar, recurrent urinary tract infections, chronic diarrhea, and leg swelling. We also discussed your current medications and made some adjustments to better manage your symptoms.  YOUR PLAN: TYPE 2 DIABETES MELLITUS WITH SUBOPTIMAL GLYCEMIC CONTROL: Your blood sugar levels have been high since you stopped taking Trulicity , and your A1c has increased to 8.2. -We will help you with the paperwork to requalify for Trulicity  through [employer]. -Monitor your blood glucose levels regularly. -Consider taking insulin before meals  RECURRENT URINARY TRACT INFECTIONS: You have had multiple urinary tract infections recently, with symptoms like burning and frequent urination. -We will order a urinalysis to confirm if you currently have a UTI. -Consider increasing your Myrbetriq  dose to 50 mg to help manage your bladder symptoms.  BLADDER INCONTINENCE: You have severe bladder incontinence, which may be worsened by your UTIs. -Increase your Myrbetriq  dose to 50 mg to improve bladder control.  Urinary tract infection: Cipro  500 mg twice daily x 14 days.   CHRONIC DIARRHEA: You experience diarrhea every time you eat, along with severe abdominal pain and urgency. Hopefully cipro  will also help this.   -Increase your dicyclomine  dose to 20 mg before meals and at bedtime. -We will refer you to gastroenterologist Dr. Nicolina in [place] for further evaluation.  LOWER EXTREMITY EDEMA: You have severe leg swelling that improved after a post-surgery injection. -We will refer you to the lymphedema center in [facility] for evaluation and management.                      Contains text generated by Abridge.                                 Contains text generated by Abridge.

## 2024-01-02 NOTE — Progress Notes (Signed)
 "  Subjective:  Patient ID: Christy Crawford School, female    DOB: 11-10-1955  Age: 68 y.o. MRN: [id number]  Chief Complaint  Patient presents with   Diabetes    HPI: Discussed the use of AI scribe software for clinical note transcription with the patient, who gave verbal consent to proceed.  History of Present Illness Christy Crawford is a 68 year old female with diabetes and recurrent UTIs who presents with high blood sugar and urinary symptoms. She is accompanied by her daughter, who is her primary caregiver.  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  - Recent blood sugar readings range from 132-136 mg/dL - Blood sugar measured at 240 mg/dL on a day without food intake - Continues Tresiba  65 units daily - on lyrica  300 mg daily and on amitriptyline  25 mg daily.   Urinary tract symptoms - Bladder incontinence, frequent urination, and burning with urination - Currently taking Myrbetriq  25 mg daily for overactive bladder. But not helping enough.   Chronic diarrhea and abdominal pain - Diarrhea occurs every time she eats, regardless of food type - Severe abdominal pain and urgency associated with diarrhea - Difficulty reaching the bathroom in time due to urgency - No diarrhea on days when she does not eat - Currently taking dicyclomine  10 mg four times a day but not specifically taking before meals without symptom relief - No nausea or vomiting  Hyperlipidemia: Current medications: Patient is currently taking Rosuvastatin  10 mg take 1 tablet daily.   Hypertension: Current medications: Patient is currently taking Lisinopril  40 mg twice daily.   Depression: Patient is currently taking Zoloft  100 mg twice daily. Has taken zoloft  for years long before diarrhea began. On xanax 1 mg oral three times daily. Sees Dr Trinidad Aurora every 6  months.   Peripheral edema - Persistent significant edema in both legs.  - Diuretics have not helped in the past.  - sores on legs.   - Wrapped daily at home, but had left them uncovered for me to see.  - no fever.  General health and functional status - Lives in [place] - Difficulty traveling long distances due to health conditions - No earaches, sore throat, shortness of breath, or chest pain       01/02/2024    2:25 PM 08/10/2023    4:17 PM 04/19/2023    1:16 PM 02/04/2023   11:03 AM 10/28/2022    3:03 PM  Depression screen PHQ 2/9  Decreased Interest 0 0 1 0 1  Down, Depressed, Hopeless 0 0 2 0 0  PHQ - 2 Score 0 0 3 0 1  Altered sleeping 0 0 2 1 2   Tired, decreased energy 0 1  1 1   Change in appetite 0 0 1 0 2  Feeling bad or failure about yourself  0 1 0 0 0  Trouble concentrating 0 1  0 0  Moving slowly or fidgety/restless 0 1 2 0 0  Suicidal thoughts 0 0 0 0 0  PHQ-9 Score 0 4 8 2 6   Difficult doing work/chores Not difficult at all Very difficult Not difficult at all Not difficult at all Somewhat difficult        04/19/2023    1:29 PM  Fall Risk   Falls in the past year? 0  Number falls  in past yr: 0  Injury with Fall? 0  Follow up Falls evaluation completed;Education provided;Falls prevention discussed    Patient Care Team: Sherre Clapper, MD as PCP - General (Family Medicine) Onita Duos, MD as Consulting Physician (Neurology) Vincente Grip, MD as Consulting Physician (Psychiatry) Erasmo Bernardino BRAVO, OD (Optometry)   Review of Systems  Constitutional:  Negative for chills, fatigue and fever.  HENT:  Negative for congestion, ear pain and sore throat.   Respiratory:  Negative for cough and shortness of breath.   Cardiovascular:  Positive for leg swelling. Negative for chest pain.  Gastrointestinal:  Positive for abdominal pain and diarrhea. Negative for constipation, nausea and vomiting.  Genitourinary:  Negative for dysuria and urgency.  Musculoskeletal:   Negative for arthralgias and myalgias.  Neurological:  Negative for dizziness and headaches.  Psychiatric/Behavioral:  Negative for dysphoric mood. The patient is not nervous/anxious.     Current Outpatient Medications on File Prior to Visit  Medication Sig Dispense Refill   acetaminophen  (TYLENOL ) 325 MG tablet Take 325 mg by mouth every 6 (six) hours as needed.     ALPRAZolam (XANAX) 1 MG tablet Take 1 mg by mouth 3 (three) times daily.     amitriptyline  (ELAVIL ) 25 MG tablet TAKE 1 TABLET BY MOUTH EVERY NIGHT AT BEDTIME 30 tablet 10   COMFORT EZ PEN NEEDLES 31G X 5 MM MISC E11.44 use new needle with each new injection 100 each 2   finasteride  (PROSCAR ) 5 MG tablet Take 5 mg by mouth daily.     glucose blood (ONETOUCH VERIO) test strip Use new strip to test FBS 3 times a day. 300 strip 3   Lancets (ONETOUCH DELICA PLUS LANCET33G) MISC USE TO check blood glucose three times daily AS DIRECTED 100 each 12   lisinopril  (ZESTRIL ) 40 MG tablet TAKE ONE (1) TABLET BY MOUTH TWICE DAILY WITH BREAKFAST AND NIGHTLY AT BEDTIME 60 tablet 10   montelukast  (SINGULAIR ) 10 MG tablet TAKE 1 TABLET BY MOUTH EVERY DAY AT BEDTIME 30 tablet 10   NOVOFINE PEN NEEDLE 32G X 6 MM MISC USE AS DIRECTED 100 each 10   NOVOFINE PLUS PEN NEEDLE 32G X 4 MM MISC      pregabalin  (LYRICA ) 300 MG capsule TAKE 1 CAPSULE BY MOUTH DAILY 90 capsule 1   rosuvastatin  (CRESTOR ) 10 MG tablet TAKE 1 TABLET BY MOUTH ONCE DAILY 30 tablet 10   sertraline  (ZOLOFT ) 100 MG tablet Take 1 tablet (100 mg total) by mouth 2 (two) times daily. 180 tablet 1   TRESIBA  FLEXTOUCH 100 UNIT/ML FlexTouch Pen INJECT 65 UNITS SUBCUTANEOUSLY EVERY DAY 15 mL 10   Vitamin D , Cholecalciferol, 25 MCG (1000 UT) CAPS Take 2,000 Int'l Units by mouth.     No current facility-administered medications on file prior to visit.   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   lens implants Bilateral  Family History  Adopted: Yes  Problem Relation Age of Onset   Diabetes Maternal Aunt    Social History   Socioeconomic History   Marital status: Married    Spouse name: Not on file   Number of children: 1   Years of education: Not on file   Highest education level: Not on file  Occupational History   Occupation: disabled  Tobacco Use   Smoking status: Former    Current packs/day: 0.00    Types: Cigarettes    Quit date: 2000    Years since quitting: 25.7   Smokeless tobacco: Never  Vaping Use   Vaping status: Never Used  Substance and Sexual Activity   Alcohol use: Yes    Alcohol/week: 1.0 standard drink of alcohol    Types: 1 Glasses of wine per week    Comment: socially   Drug use: Never   Sexual activity: Not on file  Other Topics Concern   Not on file  Social History Narrative   Not on file   Social Drivers of Health   Financial Resource Strain: Low Risk  (04/19/2023)   Overall Financial Resource Strain (CARDIA)    Difficulty of Paying Living Expenses: Not hard at all  Food Insecurity: No Food Insecurity (04/19/2023)   Hunger Vital Sign    Worried About Running Out of Food in the Last Year: Never true    Ran Out of Food in the Last Year: Never true  Transportation Needs: No Transportation Needs (04/19/2023)   PRAPARE - Administrator, Civil Service (Medical): No    Lack of Transportation (Non-Medical): No  Physical Activity: Inactive (04/19/2023)   Exercise Vital Sign    Days of  Exercise per Week: 0 days    Minutes of Exercise per Session: 0 min  Stress: No Stress Concern Present (04/19/2023)   [employer] of Occupational Health - Occupational Stress Questionnaire    Feeling of Stress : Not at all  Social Connections: Moderately Isolated (04/19/2023)   Social Connection and Isolation Panel    Frequency of Communication with Friends and Family: More than three times a week    Frequency of Social Gatherings with Friends and Family: More than three times a week    Attends Religious Services: Never    Database Administrator or Organizations: No    Attends Engineer, Structural: Never    Marital Status: Married    Objective:  BP 128/74   Pulse 76   Temp 98.2 F (36.8 C)   Ht 6' 1 (1.854 m) Comment: patient reported  SpO2 95%   BMI 42.22 kg/m      01/03/2024    7:34 AM 01/02/2024    2:23 PM 09/28/2023    1:19 PM  BP/Weight  Systolic BP 128 138 136  Diastolic BP 74 82 78  Wt. (Lbs)  320   BMI  42.22 kg/m2     Physical Exam Vitals reviewed.  Constitutional:      Appearance: Normal appearance. She is obese.  Neck:     Vascular: No carotid bruit.  Cardiovascular:     Rate and Rhythm: Normal rate and regular rhythm.     Pulses: Normal pulses.     Heart sounds: Normal heart sounds.  Pulmonary:     Effort: Pulmonary effort is normal. No respiratory distress.     Breath sounds: Normal breath sounds.  Abdominal:     General: Abdomen is flat. Bowel sounds are normal.  Palpations: Abdomen is soft.     Tenderness: There is no abdominal tenderness.  Musculoskeletal:     Right lower leg: Edema present.     Left lower leg: Edema present.  Skin:    Findings: Lesion present.  Neurological:     Mental Status: She is alert and oriented to person, place, and time.  Psychiatric:        Mood and Affect: Mood normal.        Behavior: Behavior normal.      Diabetic foot exam was performed with the following findings:   Normal sensation of  10g monofilament Intact posterior tibialis and dorsalis pedis pulses Edema BL feet.       Lab Results  Component Value Date   WBC 7.6 01/02/2024   HGB 14.9 01/02/2024   HCT 45.6 01/02/2024   PLT 199 01/02/2024   GLUCOSE 97 01/02/2024   CHOL 142 09/28/2023   TRIG 123 09/28/2023   HDL 51 09/28/2023   LDLCALC 69 09/28/2023   ALT 19 01/02/2024   AST 28 01/02/2024   NA 144 01/02/2024   K 4.8 01/02/2024   CL 101 01/02/2024   CREATININE 0.85 01/02/2024   BUN 16 01/02/2024   CO2 26 01/02/2024   TSH 2.990 01/02/2024   HGBA1C 8.2 01/02/2024    Results for orders placed or performed in visit on 01/02/24  Microalbumin / creatinine urine ratio   Collection Time: 01/02/24  3:30 PM  Result Value Ref Range   Creatinine, Urine 78.7 Not Estab. mg/dL   Microalbumin, Urine 700.7 Not Estab. ug/mL   Microalb/Creat Ratio 380 (H) 0 - 29 mg/g creat  Comprehensive metabolic panel with GFR   Collection Time: 01/02/24  3:55 PM  Result Value Ref Range   Glucose 97 70 - 99 mg/dL   BUN 16 8 - 27 mg/dL   Creatinine, Ser 9.14 0.57 - 1.00 mg/dL   eGFR 74 >40 fO/fpw/8.26   BUN/Creatinine Ratio 19 12 - 28   Sodium 144 134 - 144 mmol/L   Potassium 4.8 3.5 - 5.2 mmol/L   Chloride 101 96 - 106 mmol/L   CO2 26 20 - 29 mmol/L   Calcium  9.6 8.7 - 10.3 mg/dL   Total Protein 6.7 6.0 - 8.5 g/dL   Albumin 4.5 3.9 - 4.9 g/dL   Globulin, Total 2.2 1.5 - 4.5 g/dL   Bilirubin Total 0.3 0.0 - 1.2 mg/dL   Alkaline Phosphatase 146 (H) 49 - 135 IU/L   AST 28 0 - 40 IU/L   ALT 19 0 - 32 IU/L  CBC with Differential/Platelet   Collection Time: 01/02/24  3:55 PM  Result Value Ref Range   WBC 7.6 3.4 - 10.8 x10E3/uL   RBC 5.06 3.77 - 5.28 x10E6/uL   Hemoglobin 14.9 11.1 - 15.9 g/dL   Hematocrit 54.3 65.9 - 46.6 %   MCV 90 79 - 97 fL   MCH 29.4 26.6 - 33.0 pg   MCHC 32.7 31.5 - 35.7 g/dL   RDW 86.1 88.2 - 84.5 %   Platelets 199 150 - 450 x10E3/uL   Neutrophils 70 Not Estab. %   Lymphs 19 Not Estab. %    Monocytes 6 Not Estab. %   Eos 3 Not Estab. %   Basos 1 Not Estab. %   Neutrophils Absolute 5.4 1.4 - 7.0 x10E3/uL   Lymphocytes Absolute 1.5 0.7 - 3.1 x10E3/uL   Monocytes Absolute 0.4 0.1 - 0.9 x10E3/uL   EOS (ABSOLUTE) 0.2 0.0 -  0.4 x10E3/uL   Basophils Absolute 0.1 0.0 - 0.2 x10E3/uL   Immature Granulocytes 1 Not Estab. %   Immature Grans (Abs) 0.1 0.0 - 0.1 x10E3/uL  TSH   Collection Time: 01/02/24  3:55 PM  Result Value Ref Range   TSH 2.990 0.450 - 4.500 uIU/mL  POCT URINALYSIS DIP (CLINITEK)   Collection Time: 01/02/24  3:59 PM  Result Value Ref Range   Color, UA yellow yellow   Clarity, UA hazy (A) clear   Glucose, UA negative negative mg/dL   Bilirubin, UA negative negative   Ketones, POC UA negative negative mg/dL   Spec Grav, UA 8.984 8.989 - 1.025   Blood, UA moderate (A) negative   pH, UA 6.0 5.0 - 8.0   POC PROTEIN,UA =30 (A) negative, trace   Urobilinogen, UA 0.2 0.2 or 1.0 E.U./dL   Nitrite, UA Negative Negative   Leukocytes, UA Large (3+) (A) Negative  .  Assessment & Plan:   Assessment & Plan Mixed hyperlipidemia Well controlled.  No changes to medicines. Continue Rosuvastatin  10 mg take 1 tablet daily. Continue to work on eating a healthy diet and exercise.   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 A1c increased to 8.2. Awaiting requalification for Trulicity . - Assist with paperwork for Trulicity  requalification through [employer]. - She will need to start on trulicity  0.75 mg weekly and then gradually incrase.  - Monitor blood glucose levels regularly. - Recommend increase tresiba  70 U weekly.   Class 3 severe obesity due to excess calories with serious comorbidity and body mass index (BMI) of 40.0 to 44.9 in adult [facility]) Restart trulicity  Recommend continue to work on eating healthy diet and exercise.     Mild  recurrent major depression The current medical regimen is effective;  continue present plan and medications. Continue Zoloft  100 mg two daily and xanax 1 mg oral three times a day. Sees Dr Trinidad Aurora every 6 months.      Essential hypertension, benign Well controlled.  Continue LISINOPRIL  40 MG twice DAILY.  Continue to work on eating a healthy diet and exercise.  Labs drawn today.  Orders:   CBC with Differential/Platelet   Acute cystitis with hematuria Prescription cipro  500 mg twice daily x 7 days. Orders:   POCT URINALYSIS DIP (CLINITEK)   Urine Culture   Microalbumin / creatinine urine ratio   Lymphedema Refer to lymphedema center.   Orders:   Comprehensive metabolic panel with GFR   TSH   Ambulatory referral to Physical Therapy  Diarrhea, unspecified type - IBS-D suspected.  - Cipro  500 mg twice daily x 14 days. My preference would be for treatment with xifaxin, but cost and insurance coverage are often an issue.  - Increase dicyclomine  20 mg before meals and before bed.   Orders:   Ambulatory referral to Gastroenterology  Urge incontinence - Increase myrbetriq  50 mg daily.  Orders:   mirabegron  ER (MYRBETRIQ ) 50 MG TB24 tablet; Take 1 tablet (50 mg total) by mouth daily.  Other cirrhosis of liver (HCC) [facility]. Add on hepatitis panel.  Consider repeat ct scan of abdomen.        Body mass index is 42.22 kg/m.    Meds ordered this encounter  Medications   mirabegron  ER (MYRBETRIQ ) 50 MG TB24 tablet    Sig: Take 1 tablet (50 mg total) by mouth  daily.    Dispense:  90 tablet    Refill:  1   dicyclomine  (BENTYL ) 20 MG tablet    Sig: Take 1 tablet (20 mg total) by mouth 4 (four) times daily -  before meals and at bedtime.    Dispense:  360 tablet    Refill:  1   ciprofloxacin  (CIPRO ) 500 MG tablet    Sig: Take 1 tablet (500 mg total) by mouth 2 (two) times daily for 14 days.    Dispense:  28 tablet    Refill:  0     Orders Placed This Encounter  Procedures   Urine Culture   Comprehensive metabolic panel with GFR   CBC with Differential/Platelet   TSH   Microalbumin / creatinine urine ratio   Ambulatory referral to Gastroenterology   Ambulatory referral to Physical Therapy   POCT URINALYSIS DIP (CLINITEK)     Total time spent on today's visit was 45 minutes, including both face-to-face time and nonface-to-face time personally spent on review of chart (labs and imaging), discussing labs and goals, discussing further work-up, treatment options, referrals to specialist if needed, reviewing outside records of pertinent, answering patient's questions, and coordinating care.   Follow-up: Return in about 3 months (around 04/02/2024) for chronic follow up.  An After Visit Summary was printed and given to the patient.  Abigail Free, MD [facility] [phone] "

## 2024-01-03 ENCOUNTER — Ambulatory Visit: Payer: Self-pay | Admitting: Family Medicine

## 2024-01-03 DIAGNOSIS — R809 Proteinuria, unspecified: Secondary | ICD-10-CM

## 2024-01-03 DIAGNOSIS — E1144 Type 2 diabetes mellitus with diabetic amyotrophy: Secondary | ICD-10-CM

## 2024-01-03 LAB — CBC WITH DIFFERENTIAL/PLATELET
Basophils Absolute: 0.1 x10E3/uL (ref 0.0–0.2)
Basos: 1 %
EOS (ABSOLUTE): 0.2 x10E3/uL (ref 0.0–0.4)
Eos: 3 %
Hematocrit: 45.6 % (ref 34.0–46.6)
Hemoglobin: 14.9 g/dL (ref 11.1–15.9)
Immature Grans (Abs): 0.1 x10E3/uL (ref 0.0–0.1)
Immature Granulocytes: 1 %
Lymphocytes Absolute: 1.5 x10E3/uL (ref 0.7–3.1)
Lymphs: 19 %
MCH: 29.4 pg (ref 26.6–33.0)
MCHC: 32.7 g/dL (ref 31.5–35.7)
MCV: 90 fL (ref 79–97)
Monocytes Absolute: 0.4 x10E3/uL (ref 0.1–0.9)
Monocytes: 6 %
Neutrophils Absolute: 5.4 x10E3/uL (ref 1.4–7.0)
Neutrophils: 70 %
Platelets: 199 x10E3/uL (ref 150–450)
RBC: 5.06 x10E6/uL (ref 3.77–5.28)
RDW: 13.8 % (ref 11.7–15.4)
WBC: 7.6 x10E3/uL (ref 3.4–10.8)

## 2024-01-03 LAB — COMPREHENSIVE METABOLIC PANEL WITH GFR
ALT: 19 IU/L (ref 0–32)
AST: 28 IU/L (ref 0–40)
Albumin: 4.5 g/dL (ref 3.9–4.9)
Alkaline Phosphatase: 146 IU/L — ABNORMAL HIGH (ref 49–135)
BUN/Creatinine Ratio: 19 (ref 12–28)
BUN: 16 mg/dL (ref 8–27)
Bilirubin Total: 0.3 mg/dL (ref 0.0–1.2)
CO2: 26 mmol/L (ref 20–29)
Calcium: 9.6 mg/dL (ref 8.7–10.3)
Chloride: 101 mmol/L (ref 96–106)
Creatinine, Ser: 0.85 mg/dL (ref 0.57–1.00)
Globulin, Total: 2.2 g/dL (ref 1.5–4.5)
Glucose: 97 mg/dL (ref 70–99)
Potassium: 4.8 mmol/L (ref 3.5–5.2)
Sodium: 144 mmol/L (ref 134–144)
Total Protein: 6.7 g/dL (ref 6.0–8.5)
eGFR: 74 mL/min/1.73 (ref >59)

## 2024-01-03 LAB — TSH: TSH: 2.99 u[IU]/mL (ref 0.450–4.500)

## 2024-01-03 LAB — MICROALBUMIN / CREATININE URINE RATIO
Creatinine, Urine: 78.7 mg/dL
Microalb/Creat Ratio: 380 mg/g{creat} — ABNORMAL HIGH (ref 0–29)
Microalbumin, Urine: 299.2 ug/mL

## 2024-01-04 MED ORDER — TIRZEPATIDE 2.5 MG/0.5ML ~~LOC~~ SOAJ: 2.5000 mg | SUBCUTANEOUS | 1 refills | Status: DC

## 2024-01-04 MED ORDER — EMPAGLIFLOZIN 10 MG PO TABS: 10.0000 mg | ORAL_TABLET | Freq: Every day | ORAL | 3 refills | Status: AC

## 2024-01-05 ENCOUNTER — Other Ambulatory Visit: Payer: Self-pay | Admitting: Family Medicine

## 2024-01-05 DIAGNOSIS — Z1231 Encounter for screening mammogram for malignant neoplasm of breast: Secondary | ICD-10-CM

## 2024-01-05 LAB — URINE CULTURE

## 2024-01-17 ENCOUNTER — Other Ambulatory Visit: Payer: Self-pay | Admitting: Family Medicine

## 2024-01-17 ENCOUNTER — Other Ambulatory Visit: Payer: Self-pay

## 2024-01-19 ENCOUNTER — Other Ambulatory Visit: Payer: Self-pay

## 2024-01-19 ENCOUNTER — Telehealth: Payer: Self-pay

## 2024-01-19 DIAGNOSIS — Z79899 Other long term (current) drug therapy: Secondary | ICD-10-CM

## 2024-01-19 NOTE — Telephone Encounter (Signed)
 Can you look into what is going on with her patient assistance. We do not have anything in the office for her PAP nor know who is completing and submitting her info.  Copied from CRM #[id number]. Topic: General - Other >> Jan 19, 2024  9:53 AM Antwanette L wrote: Reason for CRM: Alexa from the [employer] called regarding a submitted application. On page 9, the physician's section is incorrectly dated as 01/02/2027. Since the current year is 2025, this portion must be corrected and resubmitted.The patient sections on pages 4 and 6 also need to be refaxed, as the bottom halves of both pages are completely blank. [employer] fax number is [phone]. Alexa can be contacted at [phone]

## 2024-01-23 ENCOUNTER — Telehealth: Payer: Self-pay

## 2024-01-23 NOTE — Progress Notes (Unsigned)
 Complex Care Management Note Care Guide Note  01/23/2024 Name: Christy Crawford MRN: [id number] DOB: 1955/11/14   Complex Care Management Outreach Attempts: An unsuccessful telephone outreach was attempted today to offer the patient information about available complex care management services.  Follow Up Plan:  Additional outreach attempts will be made to offer the patient complex care management information and services.   Encounter Outcome:  No Answer  Dreama Lynwood [facility]  [facility], [facility] VBCI Assistant Direct Dial: [phone]  Fax: [phone]

## 2024-01-24 NOTE — Progress Notes (Signed)
 Complex Care Management Note  Care Guide Note 01/24/2024 Name: Christy Crawford MRN: [id number] DOB: 1956/01/11  [name] is a 68 y.o. year old female who sees Cox, Kirsten, MD for primary care. I reached out to [name] by phone today to offer complex care management services.  [name] was given information about Complex Care Management services today including:   The Complex Care Management services include support from the care team which includes your Nurse Care Manager, Clinical Social Worker, or Pharmacist.  The Complex Care Management team is here to help remove barriers to the health concerns and goals most important to you. Complex Care Management services are voluntary, and the patient may decline or stop services at any time by request to their care team member.   Complex Care Management Consent Status: Patient agreed to services and verbal consent obtained.   Follow up plan:  Telephone appointment with complex care management team member scheduled for:  01/25/24 at 1:00 p.m.   Encounter Outcome:  Patient Scheduled  Dreama Lynwood [facility]  [facility], [facility] VBCI Assistant Direct Dial: [phone]  Fax: [phone]

## 2024-01-25 ENCOUNTER — Other Ambulatory Visit

## 2024-01-25 ENCOUNTER — Telehealth: Payer: Self-pay

## 2024-01-25 NOTE — Progress Notes (Signed)
 Complex Care Management Care Guide Note  01/25/2024 Name: IRINA OKELLY MRN: [id number] DOB: 1955/11/28  [name] is a 68 y.o. year old female who is a primary care patient of Cox, Kirsten, MD and is actively engaged with the care management team. I reached out to [name] by phone today to assist with re-scheduling  with the Pharmacist.  Follow up plan: Telephone appointment with complex care management team member scheduled for:  02/01/24 at 1:00 p.m.   Dreama Lynwood [facility]  [facility], [facility] VBCI Assistant Direct Dial: [phone]  Fax: [phone]

## 2024-02-01 ENCOUNTER — Other Ambulatory Visit

## 2024-02-01 NOTE — Progress Notes (Signed)
   02/01/2024 Name: Christy Crawford MRN: [id number] DOB: 29-Jun-1955  No chief complaint on file.   Christy Crawford is a 68 y.o. year old female who presented for a telephone visit.   They were referred to the pharmacist by their PCP for assistance in managing diabetes and medication access.    Medication Access/Adherence  Current Pharmacy:  [facility], [place] - [address] [place] [address] Phone: [phone] Fax: [phone]  [facility] [address], [place] - [address] [place] [address] Phone: [phone] Fax: [phone]   Patient reports affordability concerns with their medications: Not at this time. Has payment plan set up through insurance and doing well with that. Patient reports access/transportation concerns to their pharmacy: No  Patient reports adherence concerns with their medications:  No     - discussed current financial needs. Now that they have met deductible DM medications are well covered through insurance. Previously had received Trulicity  through PAP then changed to Mounjaro recently. Picked up both mounjaro and jardiance at the pharmacy. Tresiba  currently filled through pharmacy as well.  - reviewed LIS - previously eligible but now slightly over income cut off.  - encouraged patient/husband to talk with medicare counselor to see what plans are available  - confirmed there is no need for PAP at this tinme.   Follow Up Plan: follow-up PRN   Future Appointments  Date Time Provider Department Center  03/16/2024 11:20 AM [facility]  04/03/2024  2:00 PM [facility], Kirsten, MD [facility] [place]  04/19/2024  3:00 PM Ivin Snuffer, MD [facility]     Lang Sieve, PharmD, BCGP Clinical Pharmacist  [phone]

## 2024-02-16 ENCOUNTER — Other Ambulatory Visit: Payer: Self-pay | Admitting: Family Medicine

## 2024-02-16 MED ORDER — TRESIBA FLEXTOUCH 100 UNIT/ML ~~LOC~~ SOPN: 65.0000 [IU] | PEN_INJECTOR | Freq: Every day | SUBCUTANEOUS | 10 refills | Status: AC

## 2024-02-16 NOTE — Telephone Encounter (Signed)
 Copied from CRM [id number]. Topic: Clinical - Medication Refill >> Feb 16, 2024  9:18 AM Amber H wrote: Medication: TRESIBA  FLEXTOUCH 100 UNIT/ML FlexTouch Pen  Has the patient contacted their pharmacy? Yes, was told she did not have any refills left.  (Agent: If no, request that the patient contact the pharmacy for the refill. If patient does not wish to contact the pharmacy document the reason why and proceed with request.) (Agent: If yes, when and what did the pharmacy advise?)  [id number] [place], [place] - [address] [place] [address] Phone: [phone] Fax: [phone]  Is this the correct pharmacy for this prescription? Yes If no, delete pharmacy and type the correct one.   Has the prescription been filled recently? Yes  Is the patient out of the medication? Yes  Has the patient been seen for an appointment in the last year OR does the patient have an upcoming appointment? Yes  Can we respond through MyChart? Yes  Agent: Please be advised that Rx refills may take up to 3 business days. We ask that you follow-up with your pharmacy.

## 2024-02-23 ENCOUNTER — Ambulatory Visit: Payer: Self-pay

## 2024-02-23 NOTE — Telephone Encounter (Signed)
 FYI Only or Action Required?: FYI only for provider: appointment scheduled on 02/27/2024.  Patient was last seen in primary care on 01/02/2024 by Sherre Clapper, MD.  Called Nurse Triage reporting Leg Swelling.  Symptoms began several years ago.  Interventions attempted: Other: Wraps legs.  Symptoms are: gradually worsening.  Triage Disposition: See PCP When Office is Open (Within 3 Days) (overriding See PCP Within 2 Weeks)  Patient/caregiver understands and will follow disposition?: Yes        Copied from CRM [id number]. Topic: General - Call Back - No Documentation >> Feb 23, 2024  1:55 PM Darshell M wrote: Reason for CRM: Patient had hernia repair a year ago. Possible blood clots. Patient put on Lovanox following surgeries. It cleared up her edema, redness went away. She has had edema for years and husband wraps her legs for it. Provider suggested patient might have blood clots in her legs and that is why the Lovanox worked. Reason for Disposition  [1] MILD swelling of both ankles (i.e., pedal edema) AND [2] is a chronic symptom (recurrent or ongoing AND present > 4 weeks)    Swelling in both legs ongoing for years  Answer Assessment - Initial Assessment Questions 1. ONSET: When did the swelling start? (e.g., minutes, hours, days)     For years  2. LOCATION: What part of the leg is swollen?  Are both legs swollen or just one leg?     Both legs  3. REDNESS: Is there redness or signs of infection?     Redness  4. PAIN: Is the swelling painful to touch? If Yes, ask: How painful is it?   (Scale 1-10; mild, moderate or severe)     Moderate to severe  5. FEVER: Do you have a fever? If Yes, ask: What is it, how was it measured, and when did it start?      Denies  6. CAUSE: What do you think is causing the leg swelling?     Blood clots  7. MEDICAL HISTORY: Do you have a history of blood clots (e.g., DVT), cancer, heart failure, kidney disease, or liver failure?      Denies  8. RECURRENT SYMPTOM: Have you had leg swelling before? If Yes, ask: When was the last time? What happened that time?     Yes  9. OTHER SYMPTOMS: Do you have any other symptoms? (e.g., chest pain, difficulty breathing)       Denies   He states pt had a hernia repair. She was on lovanox and swelling went away. Requesting to see if pt may have possible blood clots in legs. Wrapping legs with una boots.  Protocols used: Leg Swelling and Edema-A-AH

## 2024-02-27 ENCOUNTER — Ambulatory Visit

## 2024-02-27 VITALS — BP 136/68 | HR 94 | Temp 97.6°F | Resp 18 | Ht 73.0 in

## 2024-02-27 DIAGNOSIS — I872 Venous insufficiency (chronic) (peripheral): Secondary | ICD-10-CM | POA: Diagnosis not present

## 2024-02-27 DIAGNOSIS — L97511 Non-pressure chronic ulcer of other part of right foot limited to breakdown of skin: Secondary | ICD-10-CM | POA: Insufficient documentation

## 2024-02-27 NOTE — Assessment & Plan Note (Signed)
 Right foot stage 2 diabetic ulcer with infection Stage 2 diabetic ulcer on the right foot with infection, present for approximately three weeks. The ulcer is 3 cm by 5 cm with slough around the ulcer. The skin is broken down, but no muscle or fat is exposed, indicating it is not a stage 3 ulcer.    She has been on doxycycline for 14 days, with one pill remaining. The ulcer has shown little improvement with antibiotics as per the patient. There is a risk of progression if not managed properly. - Referred to wound care clinic at the [facility] for specialized management. - Elevate feet and keep the wound clean. - Wash the wound with soap and water every other day. - Apply triple antibiotic ointment or bacitracin to the wound. - Have husband wrap the wound after cleaning.  FAXED A WOUND CARE REFERRAL TO [place].

## 2024-02-27 NOTE — Assessment & Plan Note (Signed)
 Bilateral lower extremity lymphedema, stasis dermatitis with chronic wounds Chronic bilateral lower extremity lymphedema with associated chronic wounds. Explained to her that lymphedema is not primarily fluid-related but involves lymph nodes. Diuretics have been ineffective in managing the condition. Lovenox  is not indicated as it does not address the lymphedema or the ulcers. - A referral was made previously to lymphedema clinic for specialized management. Not sure if she heard from them

## 2024-02-27 NOTE — Progress Notes (Signed)
 Eg swell  Acute Office Visit  Subjective:    Patient ID: Christy Crawford, female    DOB: 1955-11-22, 68 y.o.   MRN: [id number]  Chief Complaint  Patient presents with   Leg Swelling    HPI:   Discussed the use of AI scribe software for clinical note transcription with the patient, who gave verbal consent to proceed.   History of Present Illness   Christy Crawford is a 68 year old female with type 2 diabetes and lymphedema who presents with a non-healing ulcer on the right foot.  Right foot ulcer - Non-healing ulcer located on the dorsal aspect of the right foot - Onset approximately three weeks ago - Initially presented as a round, raised, erythematous lesion that subsequently ulcerated and began to weep - Minimal improvement after 14 days of doxycycline, with one pill remaining  Lower extremity ulcerations and lymphedema - Significant bilateral leg swelling - Open sores present from knees to ankles, managed with Unna boots and dressings applied by her husband - Diuretics and leg elevation have not provided effective relief of swelling - Open sores also present on the left leg  Impaired mobility - Mostly wheelchair-bound - Requires assistance from her husband for mobility  Peripheral neuropathy - Diffuse neuropathy described as affecting all extremities  Relevant medical history - Type 2 diabetes mellitus - Rare nervous system disease previously mistaken for multiple sclerosis - History of Lovenox  use following hernia repair, which was perceived as beneficial at that time - History of tobacco use, quit on Valentine's Day 2000         Past Medical History:  Diagnosis Date   Chronic ulcer of right calf (HCC)    Diabetes mellitus without complication (HCC)    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   lens implants Bilateral     Family History  Adopted: Yes  Problem Relation Age of Onset   Diabetes Maternal Aunt     Social History   Socioeconomic History   Marital status: Married    Spouse name: Not on file   Number of children: 1   Years of education: Not on file   Highest education level: Not on file  Occupational History   Occupation: disabled  Tobacco Use   Smoking status: Former    Current packs/day: 0.00    Types: Cigarettes    Quit date: 2000    Years since quitting: 25.9   Smokeless tobacco: Never  Vaping Use   Vaping status: Never Used  Substance and Sexual Activity   Alcohol use: Yes    Alcohol/week: 1.0 standard drink of alcohol    Types: 1 Glasses of wine per week    Comment: socially   Drug use: Never   Sexual activity: Not on file  Other Topics Concern   Not on file  Social History Narrative   Not on file   Social Drivers of Health   Financial Resource Strain: Low Risk  (04/19/2023)   Overall Financial Resource Strain (CARDIA)  Difficulty of Paying Living Expenses: Not hard at all  Food Insecurity: No Food Insecurity (04/19/2023)   Hunger Vital Sign    Worried About Running Out of Food in the Last Year: Never true    Ran Out of Food in the Last Year: Never true  Transportation Needs: No Transportation Needs (04/19/2023)   PRAPARE - Administrator, Civil Service (Medical): No    Lack of Transportation (Non-Medical): No  Physical Activity: Inactive (04/19/2023)   Exercise Vital Sign    Days of Exercise per Week: 0 days    Minutes of Exercise per Session: 0 min  Stress: No Stress Concern Present (04/19/2023)   [employer] of Occupational Health - Occupational Stress Questionnaire     Feeling of Stress : Not at all  Social Connections: Moderately Isolated (04/19/2023)   Social Connection and Isolation Panel    Frequency of Communication with Friends and Family: More than three times a week    Frequency of Social Gatherings with Friends and Family: More than three times a week    Attends Religious Services: Never    Database Administrator or Organizations: No    Attends Banker Meetings: Never    Marital Status: Married  Catering Manager Violence: Not At Risk (04/19/2023)   Humiliation, Afraid, Rape, and Kick questionnaire    Fear of Current or Ex-Partner: No    Emotionally Abused: No    Physically Abused: No    Sexually Abused: No    Outpatient Medications Prior to Visit  Medication Sig Dispense Refill   acetaminophen  (TYLENOL ) 325 MG tablet Take 325 mg by mouth every 6 (six) hours as needed.     ALPRAZolam (XANAX) 1 MG tablet Take 1 mg by mouth 3 (three) times daily.     amitriptyline  (ELAVIL ) 25 MG tablet TAKE 1 TABLET BY MOUTH EVERY NIGHT AT BEDTIME 30 tablet 10   COMFORT EZ PEN NEEDLES 31G X 5 MM MISC E11.44 use new needle with each new injection 100 each 2   dicyclomine  (BENTYL ) 10 MG capsule TAKE 1 CAPSULE BY MOUTH FOUR TIMES A DAY BEFORE MEALS AND AT BEDTIME 360 capsule 11   dicyclomine  (BENTYL ) 20 MG tablet Take 1 tablet (20 mg total) by mouth 4 (four) times daily -  before meals and at bedtime. 360 tablet 1   empagliflozin  (JARDIANCE ) 10 MG TABS tablet Take 1 tablet (10 mg total) by mouth daily. 90 tablet 3   finasteride  (PROSCAR ) 5 MG tablet TAKE 1 TABLET BY MOUTH DAILY 30 tablet 11   glucose blood (ONETOUCH VERIO) test strip Use new strip to test FBS 3 times a day. 300 strip 3   insulin degludec  (TRESIBA  FLEXTOUCH) 100 UNIT/ML FlexTouch Pen Inject 65 Units into the skin daily. 15 mL 10   Lancets (ONETOUCH DELICA PLUS LANCET33G) MISC USE TO check blood glucose three times daily AS DIRECTED 100 each 12   lisinopril  (ZESTRIL ) 40 MG tablet TAKE  ONE (1) TABLET BY MOUTH TWICE DAILY WITH BREAKFAST AND NIGHTLY AT BEDTIME 60 tablet 10   mirabegron  ER (MYRBETRIQ ) 50 MG TB24 tablet Take 1 tablet (50 mg total) by mouth daily. 90 tablet 1   montelukast  (SINGULAIR ) 10 MG tablet TAKE 1 TABLET BY MOUTH EVERY DAY AT BEDTIME 30 tablet 10   NOVOFINE PEN NEEDLE 32G X 6 MM MISC USE AS DIRECTED 100 each 10   NOVOFINE PLUS PEN NEEDLE 32G X 4 MM MISC      pregabalin  (LYRICA ) 300  MG capsule TAKE 1 CAPSULE BY MOUTH DAILY 90 capsule 1   rosuvastatin  (CRESTOR ) 10 MG tablet TAKE 1 TABLET BY MOUTH ONCE DAILY 30 tablet 10   sertraline  (ZOLOFT ) 100 MG tablet Take 1 tablet (100 mg total) by mouth 2 (two) times daily. 180 tablet 1   tirzepatide  (MOUNJARO ) 2.5 MG/0.5ML Pen Inject 2.5 mg into the skin once a week. 2 mL 1   Vitamin D , Cholecalciferol, 25 MCG (1000 UT) CAPS Take 2,000 Int'l Units by mouth.     No facility-administered medications prior to visit.    No Known Allergies  Review of Systems  Constitutional:  Negative for chills, diaphoresis, fatigue and fever.  HENT:  Negative for congestion, ear pain and sinus pain.   Eyes: Negative.   Respiratory:  Negative for cough and shortness of breath.   Cardiovascular:  Positive for leg swelling. Negative for chest pain.  Gastrointestinal:  Negative for abdominal pain, constipation, diarrhea, nausea and vomiting.  Endocrine: Negative.   Genitourinary:  Negative for dysuria, frequency and urgency.  Musculoskeletal:  Negative for arthralgias.  Skin:  Positive for color change and wound.  Allergic/Immunologic: Negative.   Neurological:  Negative for dizziness, weakness, light-headedness and headaches.  Psychiatric/Behavioral:  Negative for dysphoric mood. The patient is not nervous/anxious.        Objective:        02/27/2024   10:14 AM 01/03/2024    7:34 AM 01/02/2024    2:23 PM  Vitals with BMI  Height 6' 1 6' 1 6' 1  Weight   320 lbs  BMI   42.23  Systolic 146 128 861  Diastolic 66 74 82   Pulse 94 76 92    No data found.   Physical Exam Vitals and nursing note reviewed.  Constitutional:      Appearance: She is obese.  HENT:     Head: Normocephalic and atraumatic.  Cardiovascular:     Rate and Rhythm: Normal rate and regular rhythm.  Pulmonary:     Comments: Decreased air entry bilaterally  Musculoskeletal:     Comments: GROSS BILATERAL PEDAL EDEMA, MOST LIKELY LYMPHEDEMA noted Unna boots wrapped both lower extremities  Right dorsal foot ulcer noted on the dorsum of right foot measuring about 3 X 5 cm. Pics attached. White slough noted on the ulcer Stage 2  Neurological:     Mental Status: She is alert.        Health Maintenance Due  Topic Date Due   COVID-19 Vaccine (1) Never done   Hepatitis C Screening  Never done   Zoster Vaccines- Shingrix (1 of 2) Never done   Bone Density Scan  Never done   Fecal DNA (Cologuard)  12/18/2023   Mammogram  03/15/2024   Medicare Annual Wellness (AWV)  04/18/2024    There are no preventive care reminders to display for this patient.   Lab Results  Component Value Date   TSH 2.990 01/02/2024   Lab Results  Component Value Date   WBC 7.6 01/02/2024   HGB 14.9 01/02/2024   HCT 45.6 01/02/2024   MCV 90 01/02/2024   PLT 199 01/02/2024   Lab Results  Component Value Date   NA 144 01/02/2024   K 4.8 01/02/2024   CO2 26 01/02/2024   GLUCOSE 97 01/02/2024   BUN 16 01/02/2024   CREATININE 0.85 01/02/2024   BILITOT 0.3 01/02/2024   ALKPHOS 146 (H) 01/02/2024   AST 28 01/02/2024   ALT 19 01/02/2024   PROT  6.7 01/02/2024   ALBUMIN 4.5 01/02/2024   CALCIUM  9.6 01/02/2024   EGFR 74 01/02/2024   Lab Results  Component Value Date   CHOL 142 09/28/2023   Lab Results  Component Value Date   HDL 51 09/28/2023   Lab Results  Component Value Date   LDLCALC 69 09/28/2023   Lab Results  Component Value Date   TRIG 123 09/28/2023   Lab Results  Component Value Date   CHOLHDL 2.8 09/28/2023   Lab  Results  Component Value Date   HGBA1C 8.2 01/02/2024        Results for orders placed or performed in visit on 01/02/24  Urine Culture   Collection Time: 01/02/24  3:30 PM   Specimen: Urine   UR  Result Value Ref Range   Urine Culture, Routine Final report (A)    Organism ID, Bacteria Klebsiella pneumoniae (A)    Antimicrobial Susceptibility Comment   Microalbumin / creatinine urine ratio   Collection Time: 01/02/24  3:30 PM  Result Value Ref Range   Creatinine, Urine 78.7 Not Estab. mg/dL   Microalbumin, Urine 700.7 Not Estab. ug/mL   Microalb/Creat Ratio 380 (H) 0 - 29 mg/g creat  Comprehensive metabolic panel with GFR   Collection Time: 01/02/24  3:55 PM  Result Value Ref Range   Glucose 97 70 - 99 mg/dL   BUN 16 8 - 27 mg/dL   Creatinine, Ser 9.14 0.57 - 1.00 mg/dL   eGFR 74 >40 fO/fpw/8.26   BUN/Creatinine Ratio 19 12 - 28   Sodium 144 134 - 144 mmol/L   Potassium 4.8 3.5 - 5.2 mmol/L   Chloride 101 96 - 106 mmol/L   CO2 26 20 - 29 mmol/L   Calcium  9.6 8.7 - 10.3 mg/dL   Total Protein 6.7 6.0 - 8.5 g/dL   Albumin 4.5 3.9 - 4.9 g/dL   Globulin, Total 2.2 1.5 - 4.5 g/dL   Bilirubin Total 0.3 0.0 - 1.2 mg/dL   Alkaline Phosphatase 146 (H) 49 - 135 IU/L   AST 28 0 - 40 IU/L   ALT 19 0 - 32 IU/L  CBC with Differential/Platelet   Collection Time: 01/02/24  3:55 PM  Result Value Ref Range   WBC 7.6 3.4 - 10.8 x10E3/uL   RBC 5.06 3.77 - 5.28 x10E6/uL   Hemoglobin 14.9 11.1 - 15.9 g/dL   Hematocrit 54.3 65.9 - 46.6 %   MCV 90 79 - 97 fL   MCH 29.4 26.6 - 33.0 pg   MCHC 32.7 31.5 - 35.7 g/dL   RDW 86.1 88.2 - 84.5 %   Platelets 199 150 - 450 x10E3/uL   Neutrophils 70 Not Estab. %   Lymphs 19 Not Estab. %   Monocytes 6 Not Estab. %   Eos 3 Not Estab. %   Basos 1 Not Estab. %   Neutrophils Absolute 5.4 1.4 - 7.0 x10E3/uL   Lymphocytes Absolute 1.5 0.7 - 3.1 x10E3/uL   Monocytes Absolute 0.4 0.1 - 0.9 x10E3/uL   EOS (ABSOLUTE) 0.2 0.0 - 0.4 x10E3/uL   Basophils  Absolute 0.1 0.0 - 0.2 x10E3/uL   Immature Granulocytes 1 Not Estab. %   Immature Grans (Abs) 0.1 0.0 - 0.1 x10E3/uL  TSH   Collection Time: 01/02/24  3:55 PM  Result Value Ref Range   TSH 2.990 0.450 - 4.500 uIU/mL  POCT URINALYSIS DIP (CLINITEK)   Collection Time: 01/02/24  3:59 PM  Result Value Ref Range   Color, UA yellow yellow  Clarity, UA hazy (A) clear   Glucose, UA negative negative mg/dL   Bilirubin, UA negative negative   Ketones, POC UA negative negative mg/dL   Spec Grav, UA 8.984 8.989 - 1.025   Blood, UA moderate (A) negative   pH, UA 6.0 5.0 - 8.0   POC PROTEIN,UA =30 (A) negative, trace   Urobilinogen, UA 0.2 0.2 or 1.0 E.U./dL   Nitrite, UA Negative Negative   Leukocytes, UA Large (3+) (A) Negative     Assessment & Plan:   Assessment & Plan Ulcer of right foot, limited to breakdown of skin (HCC) Right foot stage 2 diabetic ulcer with infection Stage 2 diabetic ulcer on the right foot with infection, present for approximately three weeks. The ulcer is 3 cm by 5 cm with slough around the ulcer. The skin is broken down, but no muscle or fat is exposed, indicating it is not a stage 3 ulcer.    She has been on doxycycline for 14 days, with one pill remaining. The ulcer has shown little improvement with antibiotics as per the patient. There is a risk of progression if not managed properly. - Referred to wound care clinic at the [facility] for specialized management. - Elevate feet and keep the wound clean. - Wash the wound with soap and water every other day. - Apply triple antibiotic ointment or bacitracin to the wound. - Have husband wrap the wound after cleaning.  FAXED A WOUND CARE REFERRAL TO [place].    Stasis dermatitis of both legs Bilateral lower extremity lymphedema, stasis dermatitis with chronic wounds Chronic bilateral lower extremity lymphedema with associated chronic wounds. Explained to her that lymphedema is not primarily fluid-related  but involves lymph nodes. Diuretics have been ineffective in managing the condition. Lovenox  is not indicated as it does not address the lymphedema or the ulcers. - A referral was made previously to lymphedema clinic for specialized management. Not sure if she heard from them           Body mass index is 42.22 kg/m.SABRA       No orders of the defined types were placed in this encounter.   No orders of the defined types were placed in this encounter.    Follow-up: No follow-ups on file.  An After Visit Summary was printed and given to the patient.  Mollyann Halbert, MD [facility] [phone]

## 2024-02-28 ENCOUNTER — Other Ambulatory Visit: Payer: Self-pay | Admitting: Family Medicine

## 2024-02-28 DIAGNOSIS — E1144 Type 2 diabetes mellitus with diabetic amyotrophy: Secondary | ICD-10-CM

## 2024-03-16 ENCOUNTER — Other Ambulatory Visit: Payer: Self-pay | Admitting: Family Medicine

## 2024-03-16 ENCOUNTER — Ambulatory Visit

## 2024-03-20 ENCOUNTER — Ambulatory Visit

## 2024-03-20 VITALS — BP 138/68 | HR 90 | Temp 97.6°F | Ht 73.0 in | Wt 324.0 lb

## 2024-03-20 DIAGNOSIS — E118 Type 2 diabetes mellitus with unspecified complications: Secondary | ICD-10-CM | POA: Insufficient documentation

## 2024-03-20 DIAGNOSIS — N3941 Urge incontinence: Secondary | ICD-10-CM

## 2024-03-20 DIAGNOSIS — L97511 Non-pressure chronic ulcer of other part of right foot limited to breakdown of skin: Secondary | ICD-10-CM

## 2024-03-20 MED ORDER — ACCU-CHEK GUIDE TEST VI STRP: ORAL_STRIP | 12 refills | Status: AC

## 2024-03-20 MED ORDER — ACCU-CHEK GUIDE W/DEVICE KIT: PACK | 0 refills | Status: AC

## 2024-03-20 MED ORDER — ACCU-CHEK SOFTCLIX LANCETS MISC: 12 refills | Status: AC

## 2024-03-20 NOTE — Assessment & Plan Note (Signed)
 SABRA

## 2024-03-20 NOTE — Progress Notes (Unsigned)
 Subjective:  Patient ID: Christy Crawford, female    DOB: [birth year]  Age: 68 y.o. MRN: [id number]  Chief Complaint  Patient presents with   Medical Management of Chronic Issues    HPI: Discussed the use of AI scribe software for clinical note transcription with the patient, who gave verbal consent to proceed.         03/20/2024    2:32 PM 01/02/2024    2:25 PM 08/10/2023    4:17 PM 04/19/2023    1:16 PM 02/04/2023   11:03 AM  Depression screen PHQ 2/9  Decreased Interest 0 0 0 1 0  Down, Depressed, Hopeless 0 0 0 2 0  PHQ - 2 Score 0 0 0 3 0  Altered sleeping  0 0 2 1  Tired, decreased energy  0 1  1  Change in appetite  0 0 1 0  Feeling bad or failure about yourself   0 1 0 0  Trouble concentrating  0 1  0  Moving slowly or fidgety/restless  0 1 2 0  Suicidal thoughts  0 0 0 0  PHQ-9 Score  0  4  8  2    Difficult doing work/chores  Not difficult at all Very difficult Not difficult at all Not difficult at all     Data saved with a previous flowsheet row definition        03/20/2024    2:32 PM  Fall Risk   Falls in the past year? 0  Number falls in past yr: 0  Injury with Fall? 0  Risk for fall due to : No Fall Risks  Follow up Falls evaluation completed    Patient Care Team: Lorine Iannaccone, MD as PCP - General (Family Medicine) Onita Duos, MD as Consulting Physician (Neurology) Vincente Grip, MD as Consulting Physician (Psychiatry) Erasmo Bernardino BRAVO, OD (Optometry)   Review of Systems  Constitutional:  Negative for chills, fatigue and fever.  HENT:  Negative for congestion, ear pain, sinus pressure and sore throat.   Respiratory:  Negative for cough and shortness of breath.   Cardiovascular:  Negative for chest pain.  Gastrointestinal:  Negative for abdominal pain, constipation, diarrhea, nausea and vomiting.  Genitourinary:  Negative for dysuria and frequency.  Musculoskeletal:  Negative for arthralgias, back pain and myalgias.  Neurological:  Negative for  dizziness and headaches.  Psychiatric/Behavioral:  Negative for dysphoric mood. The patient is not nervous/anxious.     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   lens implants Bilateral     Family History  Adopted: Yes  Problem Relation Age of Onset   Diabetes Maternal Aunt    Social History   Socioeconomic History   Marital status: Married    Spouse name: Not on file   Number of children: 1   Years of education: Not on file  Highest education level: Bachelor's degree (e.g., BA, AB, BS)  Occupational History   Occupation: disabled  Tobacco Use   Smoking status: Former    Current packs/day: 0.00    Types: Cigarettes    Quit date: 2000    Years since quitting: 25.9   Smokeless tobacco: Never  Vaping Use   Vaping status: Never Used  Substance and Sexual Activity   Alcohol use: Yes    Alcohol/week: 1.0 standard drink of alcohol    Types: 1 Glasses of wine per week    Comment: socially   Drug use: Never   Sexual activity: Not on file  Other Topics Concern   Not on file  Social History Narrative   Not on file   Social Drivers of Health   Tobacco Use: Medium Risk (03/20/2024)   Patient History    Smoking Tobacco Use: Former    Smokeless Tobacco Use: Never    Passive Exposure: Not on file  Financial Resource Strain: High Risk (03/20/2024)   Overall Financial  Resource Strain (CARDIA)    Difficulty of Paying Living Expenses: Hard  Food Insecurity: Food Insecurity Present (03/20/2024)   Epic    Worried About Programme Researcher, Broadcasting/film/video in the Last Year: Sometimes true    Ran Out of Food in the Last Year: Sometimes true  Transportation Needs: No Transportation Needs (03/20/2024)   Epic    Lack of Transportation (Medical): No    Lack of Transportation (Non-Medical): No  Physical Activity: Inactive (03/20/2024)   Exercise Vital Sign    Days of Exercise per Week: 0 days    Minutes of Exercise per Session: Not on file  Stress: No Stress Concern Present (03/20/2024)   [employer] of Occupational Health - Occupational Stress Questionnaire    Feeling of Stress: Not at all  Social Connections: Moderately Isolated (03/20/2024)   Social Connection and Isolation Panel    Frequency of Communication with Friends and Family: Twice a week    Frequency of Social Gatherings with Friends and Family: Once a week    Attends Religious Services: Patient declined    Database Administrator or Organizations: No    Attends Engineer, Structural: Not on file    Marital Status: Married  Depression (PHQ2-9): Low Risk (03/20/2024)   Depression (PHQ2-9)    PHQ-2 Score: 0  Alcohol Screen: Low Risk (04/19/2023)   Alcohol Screen    Last Alcohol Screening Score (AUDIT): 0  Housing: Low Risk (03/20/2024)   Epic    Unable to Pay for Housing in the Last Year: No    Number of Times Moved in the Last Year: 0    Homeless in the Last Year: No  Utilities: Not At Risk (04/19/2023)   AHC Utilities    Threatened with loss of utilities: No  Health Literacy: Adequate Health Literacy (04/19/2023)   B1300 Health Literacy    Frequency of need for help with medical instructions: Never    Objective:  BP 138/68   Pulse 90   Temp 97.6 F (36.4 C)   Ht 6' 1 (1.854 m)   Wt (!) 324 lb (147 kg)   SpO2 94%   BMI 42.75 kg/m      03/20/2024    2:25 PM  02/27/2024   11:49 AM 02/27/2024   10:14 AM  BP/Weight  Systolic BP 138 136 146  Diastolic BP 68 68 66  Wt. (Lbs) 324    BMI 42.75 kg/m2      Physical  Exam Vitals and nursing note reviewed.  Constitutional:      Appearance: She is obese.     Comments: Sitting in a wheelchair  Neurological:     Mental Status: She is alert.     {Perform Simple Foot Exam  Perform Detailed exam:1} {Insert foot Exam (Optional):30965}   Lab Results  Component Value Date   WBC 7.6 01/02/2024   HGB 14.9 01/02/2024   HCT 45.6 01/02/2024   PLT 199 01/02/2024   GLUCOSE 97 01/02/2024   CHOL 142 09/28/2023   TRIG 123 09/28/2023   HDL 51 09/28/2023   LDLCALC 69 09/28/2023   ALT 19 01/02/2024   AST 28 01/02/2024   NA 144 01/02/2024   K 4.8 01/02/2024   CL 101 01/02/2024   CREATININE 0.85 01/02/2024   BUN 16 01/02/2024   CO2 26 01/02/2024   TSH 2.990 01/02/2024   HGBA1C 8.2 01/02/2024    Results for orders placed or performed in visit on 01/02/24  Urine Culture   Collection Time: 01/02/24  3:30 PM   Specimen: Urine   UR  Result Value Ref Range   Urine Culture, Routine Final report (A)    Organism ID, Bacteria Klebsiella pneumoniae (A)    Antimicrobial Susceptibility Comment   Microalbumin / creatinine urine ratio   Collection Time: 01/02/24  3:30 PM  Result Value Ref Range   Creatinine, Urine 78.7 Not Estab. mg/dL   Microalbumin, Urine 700.7 Not Estab. ug/mL   Microalb/Creat Ratio 380 (H) 0 - 29 mg/g creat  Comprehensive metabolic panel with GFR   Collection Time: 01/02/24  3:55 PM  Result Value Ref Range   Glucose 97 70 - 99 mg/dL   BUN 16 8 - 27 mg/dL   Creatinine, Ser 9.14 0.57 - 1.00 mg/dL   eGFR 74 >40 fO/fpw/8.26   BUN/Creatinine Ratio 19 12 - 28   Sodium 144 134 - 144 mmol/L   Potassium 4.8 3.5 - 5.2 mmol/L   Chloride 101 96 - 106 mmol/L   CO2 26 20 - 29 mmol/L   Calcium  9.6 8.7 - 10.3 mg/dL   Total Protein 6.7 6.0 - 8.5 g/dL   Albumin 4.5 3.9 - 4.9 g/dL   Globulin,  Total 2.2 1.5 - 4.5 g/dL   Bilirubin Total 0.3 0.0 - 1.2 mg/dL   Alkaline Phosphatase 146 (H) 49 - 135 IU/L   AST 28 0 - 40 IU/L   ALT 19 0 - 32 IU/L  CBC with Differential/Platelet   Collection Time: 01/02/24  3:55 PM  Result Value Ref Range   WBC 7.6 3.4 - 10.8 x10E3/uL   RBC 5.06 3.77 - 5.28 x10E6/uL   Hemoglobin 14.9 11.1 - 15.9 g/dL   Hematocrit 54.3 65.9 - 46.6 %   MCV 90 79 - 97 fL   MCH 29.4 26.6 - 33.0 pg   MCHC 32.7 31.5 - 35.7 g/dL   RDW 86.1 88.2 - 84.5 %   Platelets 199 150 - 450 x10E3/uL   Neutrophils 70 Not Estab. %   Lymphs 19 Not Estab. %   Monocytes 6 Not Estab. %   Eos 3 Not Estab. %   Basos 1 Not Estab. %   Neutrophils Absolute 5.4 1.4 - 7.0 x10E3/uL   Lymphocytes Absolute 1.5 0.7 - 3.1 x10E3/uL   Monocytes Absolute 0.4 0.1 - 0.9 x10E3/uL   EOS (ABSOLUTE) 0.2 0.0 - 0.4 x10E3/uL   Basophils Absolute 0.1 0.0 - 0.2 x10E3/uL   Immature Granulocytes 1 Not Estab. %  Immature Grans (Abs) 0.1 0.0 - 0.1 x10E3/uL  TSH   Collection Time: 01/02/24  3:55 PM  Result Value Ref Range   TSH 2.990 0.450 - 4.500 uIU/mL  POCT URINALYSIS DIP (CLINITEK)   Collection Time: 01/02/24  3:59 PM  Result Value Ref Range   Color, UA yellow yellow   Clarity, UA hazy (A) clear   Glucose, UA negative negative mg/dL   Bilirubin, UA negative negative   Ketones, POC UA negative negative mg/dL   Spec Grav, UA 8.984 8.989 - 1.025   Blood, UA moderate (A) negative   pH, UA 6.0 5.0 - 8.0   POC PROTEIN,UA =30 (A) negative, trace   Urobilinogen, UA 0.2 0.2 or 1.0 E.U./dL   Nitrite, UA Negative Negative   Leukocytes, UA Large (3+) (A) Negative  .  Assessment & Plan:   Assessment & Plan Diabetes mellitus type 2 with complications (HCC)     Ulcer of right foot, limited to breakdown of skin (HCC)     Urge incontinence  Orders:   Ambulatory referral to Urology    Body mass index is 42.75 kg/m.  Assessment and Plan      Meds ordered this encounter  Medications    Blood Glucose Monitoring Suppl (ACCU-CHEK GUIDE) w/Device KIT    Sig: Check blood sugars 3 times daily    Dispense:  1 kit    Refill:  0   glucose blood (ACCU-CHEK GUIDE TEST) test strip    Sig: Check blood sugars three times daily    Dispense:  100 each    Refill:  12   Accu-Chek Softclix Lancets lancets    Sig: Check blood sugars 3 times daily    Dispense:  100 each    Refill:  12    Orders Placed This Encounter  Procedures   Ambulatory referral to Urology       Follow-up: No follow-ups on file.  An After Visit Summary was printed and given to the patient.  Christy Maselli, MD [facility] [phone]       [1] Current Outpatient Medications on File Prior to Visit  Medication Sig Dispense Refill   acetaminophen  (TYLENOL ) 325 MG tablet Take 325 mg by mouth every 6 (six) hours as needed.     ALPRAZolam (XANAX) 1 MG tablet Take 1 mg by mouth 3 (three) times daily.     amitriptyline  (ELAVIL ) 25 MG tablet TAKE 1 TABLET BY MOUTH EVERY NIGHT AT BEDTIME 30 tablet 10   dicyclomine  (BENTYL ) 20 MG tablet Take 1 tablet (20 mg total) by mouth 4 (four) times daily -  before meals and at bedtime. 360 tablet 1   empagliflozin  (JARDIANCE ) 10 MG TABS tablet Take 1 tablet (10 mg total) by mouth daily. 90 tablet 3   finasteride  (PROSCAR ) 5 MG tablet TAKE 1 TABLET BY MOUTH DAILY 30 tablet 11   insulin degludec  (TRESIBA  FLEXTOUCH) 100 UNIT/ML FlexTouch Pen Inject 65 Units into the skin daily. 15 mL 10   lisinopril  (ZESTRIL ) 40 MG tablet TAKE ONE (1) TABLET BY MOUTH TWICE DAILY WITH BREAKFAST AND NIGHTLY AT BEDTIME 60 tablet 10   mirabegron  ER (MYRBETRIQ ) 50 MG TB24 tablet Take 1 tablet (50 mg total) by mouth daily. 90 tablet 1   montelukast  (SINGULAIR ) 10 MG tablet TAKE 1 TABLET BY MOUTH EVERY NIGHT AT BEDTIME 30 tablet 11   pregabalin  (LYRICA ) 300 MG capsule TAKE 1 CAPSULE BY MOUTH DAILY 90 capsule 1   rosuvastatin  (CRESTOR ) 10 MG tablet TAKE  1 TABLET BY MOUTH  ONCE DAILY 30 tablet 11   sertraline  (ZOLOFT ) 100 MG tablet Take 1 tablet (100 mg total) by mouth 2 (two) times daily. 180 tablet 1   tirzepatide  (MOUNJARO ) 2.5 MG/0.5ML Pen INJECT 2.5 MG SUBCUTANEOUSLY WEEKLY 2 mL 1   Vitamin D , Cholecalciferol, 25 MCG (1000 UT) CAPS Take 2,000 Int'l Units by mouth.     No current facility-administered medications on file prior to visit.

## 2024-03-20 NOTE — Assessment & Plan Note (Signed)
 Orders:    Ambulatory referral to Urology

## 2024-03-20 NOTE — Patient Instructions (Addendum)
° °  Dr. Germain Brothers, MD [email or web address]  Urologist in [place], [place] [address] [place], [place] [address]   Open  Closes 5 PM  More hours [phone]   VISIT SUMMARY: You had a follow-up visit to discuss your wound care and diabetes management. Your leg condition has significantly improved, and your foot ulcer has healed. We also reviewed your diabetes treatment and discussed your ongoing issues with urge incontinence.  YOUR PLAN: TYPE 2 DIABETES MELLITUS WITH COMPLICATIONS: Your diabetes management includes complications like neurologic amyotrophy and diabetic neuropathy. Your A1c levels have been increasing since switching from Trulicity  to Mounjaro  due to insurance issues. -Continue taking Tresiba  65 units daily. -Continue taking Mounjaro  until insurance coverage is confirmed. -We will switch to Ozempic if Mounjaro  is not covered by your insurance. -Monitor your blood glucose levels regularly. -Your glucometer and Ancestan strips have been refilled.  CHRONIC ULCER OF RIGHT FOOT: Your chronic ulcer on the right foot has healed significantly, and the edema has reduced, allowing you to wear shoes again. -Continue using compression socks and zinc-infused wraps as part of your wound care regimen. -Follow up with the wound care clinic as scheduled.  URGE INCONTINENCE: You are experiencing urge incontinence with uncontrolled urination despite taking Myrbetriq . -You have been referred to urology for further evaluation and management. -Contact Dr. Lamar MALVA Brothers in [place] to schedule your urology appointment.                      Contains text generated by Abridge.                                 Contains text generated by Abridge.

## 2024-03-21 NOTE — Assessment & Plan Note (Signed)
 Class 3 severe obesity with BMI of 42.75 with comorbidities of Diabetes, hypertension and hyperlipidemia. Unable to assess weight loss due to lack of recent prior readings.  Advised to eat healthy, low calorie diet. Continue Mounjaro . Will consider Ozempic if mounjaro  loses coverage.

## 2024-04-02 ENCOUNTER — Ambulatory Visit
Admission: RE | Admit: 2024-04-02 | Discharge: 2024-04-02 | Disposition: A | Source: Ambulatory Visit | Attending: Family Medicine | Admitting: Family Medicine

## 2024-04-02 DIAGNOSIS — Z1231 Encounter for screening mammogram for malignant neoplasm of breast: Secondary | ICD-10-CM

## 2024-04-03 ENCOUNTER — Ambulatory Visit: Admitting: Family Medicine

## 2024-04-06 ENCOUNTER — Ambulatory Visit: Payer: Self-pay | Admitting: Family Medicine

## 2024-04-12 ENCOUNTER — Telehealth: Payer: Self-pay

## 2024-04-12 DIAGNOSIS — N3946 Mixed incontinence: Secondary | ICD-10-CM

## 2024-04-12 DIAGNOSIS — N3941 Urge incontinence: Secondary | ICD-10-CM

## 2024-04-12 NOTE — Telephone Encounter (Signed)
 Is this something either of you can assist with?  Copied from CRM [id number]. Topic: General - Other >> Apr 12, 2024  1:57 PM Fonda T wrote: Reason for CRM: Pt calling, states she had submitted an application to [employer] [facility] for medication assistance for medication TRULICITY  3 MG/0.5ML SOAJ, on 01/02/24, and has not heard anything.  Per pt states Called and spoke to [facility] [facility], and they informed pt, did not receive entire application when it was returned, some pages were missing. Pages 6-9 was cut off  Pt requesting Application be completed and re-faxed to [facility] as soon as possible, and  needs to have dates changed to the Current date, and refax form.  Pt states she needs to be taking this medication, but needs assistance with covering cost from [facility]. Pt states to make sure all pages go through.  Pt is requesting a follow up call, at [phone].  Pt is aware of same day call back for follow up.

## 2024-04-13 ENCOUNTER — Other Ambulatory Visit (HOSPITAL_COMMUNITY): Payer: Self-pay

## 2024-04-16 ENCOUNTER — Telehealth: Payer: Self-pay

## 2024-04-16 ENCOUNTER — Other Ambulatory Visit: Payer: Self-pay | Admitting: Family Medicine

## 2024-04-16 MED ORDER — SEMAGLUTIDE (1 MG/DOSE) 4 MG/3ML ~~LOC~~ SOPN: 1.0000 mg | PEN_INJECTOR | SUBCUTANEOUS | 2 refills | Status: DC

## 2024-04-16 NOTE — Addendum Note (Signed)
 Addended by: Risha Barretta on: 04/16/2024 09:23 AM   Modules accepted: Orders

## 2024-04-16 NOTE — Progress Notes (Signed)
" ° °  04/16/2024 Name: AZRIEL DANCY MRN: [id number] DOB: 10/27/1955  Chief Complaint  Patient presents with   Medication Assistance    Reviewed information relating to patient assistance and test claim/insurance coverage for patients GLP1 meds. Message sent to PCP in separate encounter.   Lang Sieve, PharmD, BCGP Clinical Pharmacist  [phone]  "

## 2024-04-17 ENCOUNTER — Encounter

## 2024-04-19 ENCOUNTER — Ambulatory Visit

## 2024-04-19 VITALS — BP 122/73 | HR 89 | Ht 72.0 in | Wt 324.0 lb

## 2024-04-19 DIAGNOSIS — Z7985 Long-term (current) use of injectable non-insulin antidiabetic drugs: Secondary | ICD-10-CM | POA: Diagnosis not present

## 2024-04-19 DIAGNOSIS — N3941 Urge incontinence: Secondary | ICD-10-CM

## 2024-04-19 DIAGNOSIS — Z0000 Encounter for general adult medical examination without abnormal findings: Secondary | ICD-10-CM

## 2024-04-19 DIAGNOSIS — I152 Hypertension secondary to endocrine disorders: Secondary | ICD-10-CM | POA: Diagnosis not present

## 2024-04-19 DIAGNOSIS — E1159 Type 2 diabetes mellitus with other circulatory complications: Secondary | ICD-10-CM

## 2024-04-19 DIAGNOSIS — Z794 Long term (current) use of insulin: Secondary | ICD-10-CM

## 2024-04-19 DIAGNOSIS — E118 Type 2 diabetes mellitus with unspecified complications: Secondary | ICD-10-CM | POA: Diagnosis not present

## 2024-04-19 DIAGNOSIS — I10 Essential (primary) hypertension: Secondary | ICD-10-CM

## 2024-04-19 MED ORDER — TRULICITY 1.5 MG/0.5ML ~~LOC~~ SOAJ: 1.5000 mg | SUBCUTANEOUS | 3 refills | Status: AC

## 2024-04-19 NOTE — Patient Instructions (Signed)
 Christy Crawford,  Thank you for taking the time for your Medicare Wellness Visit. I appreciate your continued commitment to your health goals. Please review the care plan we discussed, and feel free to reach out if I can assist you further.  Please note that Annual Wellness Visits do not include a physical exam. Some assessments may be limited, especially if the visit was conducted virtually. If needed, we may recommend an in-person follow-up with your provider.  Ongoing Care Seeing your primary care provider every 3 to 6 months helps us  monitor your health and provide consistent, personalized care. 05/23/2024  Referrals If a referral was made during today's visit and you haven't received any updates within two weeks, please contact the referred provider directly to check on the status.  Recommended Screenings:  Health Maintenance  Topic Date Due   COVID-19 Vaccine (1) Never done   Hepatitis C Screening  Never done   Osteoporosis screening with Bone Density Scan  Never done   Cologuard (Stool DNA test)  12/18/2023   Medicare Annual Wellness Visit  04/18/2024   Zoster (Shingles) Vaccine (1 of 2) 06/18/2024*   Flu Shot  07/03/2024*   Eye exam for diabetics  05/16/2024   Kidney health urinalysis for diabetes  07/01/2024   Hemoglobin A1C  07/01/2024   Yearly kidney function blood test for diabetes  01/01/2025   Complete foot exam   01/01/2025   Breast Cancer Screening  04/02/2025   DTaP/Tdap/Td vaccine (2 - Td or Tdap) 12/29/2031   Pneumococcal Vaccine for age over 51  Completed   Meningitis B Vaccine  Aged Out  *Topic was postponed. The date shown is not the original due date.       04/19/2024    2:35 PM  Advanced Directives  Does Patient Have a Medical Advance Directive? No  Would patient like information on creating a medical advance directive? Yes (Inpatient - patient requests chaplain consult to create a medical advance directive)    Vision: Annual vision screenings are  recommended for early detection of glaucoma, cataracts, and diabetic retinopathy. These exams can also reveal signs of chronic conditions such as diabetes and high blood pressure.  Dental: Annual dental screenings help detect early signs of oral cancer, gum disease, and other conditions linked to overall health, including heart disease and diabetes.  Please see the attached documents for additional preventive care recommendations. Patient mentioned to have problems to get her medications.

## 2024-04-19 NOTE — Progress Notes (Signed)
 "  Chief Complaint  Patient presents with   Wellness Visit   I connected with  [name] on 04/19/24 by a audio enabled telemedicine application and verified that I am speaking with the correct person using two identifiers.  Patient Location: Home  Provider Location: Office/Clinic  Persons Participating in Visit: Patient. Her son helped her with the visit.   I discussed the limitations of evaluation and management by telemedicine. The patient expressed understanding and agreed to proceed.   Vital Signs: Because this visit was a virtual/telehealth visit, some criteria may be missing or patient reported. Any vitals not documented were not able to be obtained and vitals that have been documented are patient reported.     Subjective:   Christy Crawford is a 69 y.o. female who presents for a Medicare Annual Wellness Visit.  Visit info / Clinical Intake: Medicare Wellness Visit Type:: Subsequent Annual Wellness Visit Persons participating in visit and providing information:: patient Medicare Wellness Visit Mode:: Telephone If telephone:: video declined Since this visit was completed virtually, some vitals may be partially provided or unavailable. Missing vitals are due to the limitations of the virtual format.: Documented vitals are patient reported If Telephone or Video please confirm:: I connected with patient using audio/video enable telemedicine. I verified patient identity with two identifiers, discussed telehealth limitations, and patient agreed to proceed. Patient Location:: Home Provider Location:: Office Interpreter Needed?: No Pre-visit prep was completed: yes AWV questionnaire completed by patient prior to visit?: no Living arrangements:: (Patient-Rptd) lives with spouse/significant other Patient's Overall Health Status Rating: (!) (Patient-Rptd) fair Typical amount of pain: (!) (Patient-Rptd) a lot Does pain affect daily life?: (!) (Patient-Rptd) yes Are you currently  prescribed opioids?: no  Dietary Habits and Nutritional Risks How many meals a day?: (Patient-Rptd) 2 Eats fruit and vegetables daily?: (Patient-Rptd) yes Most meals are obtained by: (Patient-Rptd) preparing own meals In the last 2 weeks, have you had any of the following?: none Diabetic:: (!) yes Any non-healing wounds?: no How often do you check your BS?: 3 Would you like to be referred to a Nutritionist or for Diabetic Management? : no  Functional Status Activities of Daily Living (to include ambulation/medication): (!) (Patient-Rptd) Needs Assist Ambulation: (Patient-Rptd) Independent with device- listed below Medication Administration: (Patient-Rptd) Independent Home Management (perform basic housework or laundry): (Patient-Rptd) Independent Manage your own finances?: (!) (Patient-Rptd) no Primary transportation is: (Patient-Rptd) family / friends Concerns about vision?: no *vision screening is required for WTM* Concerns about hearing?: (!) yes Uses hearing aids?: no Hear whispered voice?: (!) no *in-person visit only*  Fall Screening Falls in the past year?: (Patient-Rptd) 0 Number of falls in past year: 0 Was there an injury with Fall?: 0 Fall Risk Category Calculator: 0 Patient Fall Risk Level: Low Fall Risk  Fall Risk Patient at Risk for Falls Due to: Impaired mobility Fall risk Follow up: Education provided  Home and Transportation Safety: All rugs have non-skid backing?: (Patient-Rptd) N/A, no rugs All stairs or steps have railings?: (Patient-Rptd) N/A, no stairs Grab bars in the bathtub or shower?: (Patient-Rptd) yes Have non-skid surface in bathtub or shower?: (Patient-Rptd) yes Good home lighting?: (Patient-Rptd) yes Regular seat belt use?: (Patient-Rptd) yes Hospital stays in the last year:: (Patient-Rptd) no  Cognitive Assessment Difficulty concentrating, remembering, or making decisions? : (Patient-Rptd) yes Will 6CIT or Mini Cog be Completed: yes Was  the patient able to repeat memory words in 3 tries?: yes Which version was used?: Version1 : banana, sunrise, chair  Advance Directives (For Healthcare) Does Patient Have a Medical Advance Directive?: No Would patient like information on creating a medical advance directive?: Yes (Inpatient - patient requests chaplain consult to create a medical advance directive)  Reviewed/Updated  Reviewed/Updated: Reviewed All (Medical, Surgical, Family, Medications, Allergies, Care Teams, Patient Goals)    Allergies (verified) Patient has no known allergies.   Current Medications (verified) Outpatient Encounter Medications as of 04/19/2024  Medication Sig   Accu-Chek Softclix Lancets lancets Check blood sugars 3 times daily   acetaminophen  (TYLENOL ) 325 MG tablet Take 325 mg by mouth every 6 (six) hours as needed.   ALPRAZolam (XANAX) 1 MG tablet Take 1 mg by mouth 3 (three) times daily.   amitriptyline  (ELAVIL ) 25 MG tablet TAKE 1 TABLET BY MOUTH EVERY NIGHT AT BEDTIME   Blood Glucose Monitoring Suppl (ACCU-CHEK GUIDE) w/Device KIT Check blood sugars 3 times daily   dicyclomine  (BENTYL ) 10 MG capsule Take 10 mg by mouth every 6 (six) hours as needed.   Dulaglutide  (TRULICITY ) 1.5 MG/0.5ML SOAJ Inject 1.5 mg into the skin once a week.   empagliflozin  (JARDIANCE ) 10 MG TABS tablet Take 1 tablet (10 mg total) by mouth daily.   finasteride  (PROSCAR ) 5 MG tablet TAKE 1 TABLET BY MOUTH DAILY   glucose blood (ACCU-CHEK GUIDE TEST) test strip Check blood sugars three times daily   insulin degludec  (TRESIBA  FLEXTOUCH) 100 UNIT/ML FlexTouch Pen Inject 65 Units into the skin daily.   lisinopril  (ZESTRIL ) 40 MG tablet TAKE ONE (1) TABLET BY MOUTH TWICE DAILY WITH BREAKFAST AND NIGHTLY AT BEDTIME   mirabegron  ER (MYRBETRIQ ) 50 MG TB24 tablet Take 1 tablet (50 mg total) by mouth daily.   montelukast  (SINGULAIR ) 10 MG tablet TAKE 1 TABLET BY MOUTH EVERY NIGHT AT BEDTIME   pregabalin  (LYRICA ) 300 MG capsule TAKE  1 CAPSULE BY MOUTH DAILY   rosuvastatin  (CRESTOR ) 10 MG tablet TAKE 1 TABLET BY MOUTH ONCE DAILY   sertraline  (ZOLOFT ) 100 MG tablet Take 1 tablet (100 mg total) by mouth 2 (two) times daily.   Vitamin D , Cholecalciferol, 25 MCG (1000 UT) CAPS Take 2,000 Int'l Units by mouth.   [DISCONTINUED] dicyclomine  (BENTYL ) 20 MG tablet Take 1 tablet (20 mg total) by mouth 4 (four) times daily -  before meals and at bedtime.   [DISCONTINUED] Semaglutide , 1 MG/DOSE, 4 MG/3ML SOPN Inject 1 mg as directed once a week.   No facility-administered encounter medications on file as of 04/19/2024.    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   lens implants Bilateral    Family History  Adopted: Yes  Problem Relation Age of Onset   Diabetes Maternal Aunt    Social History   Occupational History   Occupation: disabled  Tobacco Use   Smoking status: Former    Current packs/day: 0.00    Types: Cigarettes    Quit date: 2000  Years since quitting: 26.0   Smokeless tobacco: Never  Vaping Use   Vaping status: Never Used  Substance and Sexual Activity   Alcohol use: Yes    Alcohol/week: 1.0 standard drink of alcohol    Types: 1 Glasses of wine per week    Comment: socially   Drug use: Never   Sexual activity: Not on file   Tobacco Counseling Counseling given: Not Answered  SDOH Screenings   Food Insecurity: Food Insecurity Present (04/19/2024)   Housing: Low Risk (04/19/2024)  Transportation Needs: No Transportation Needs (04/19/2024)  Utilities: Not At Risk (04/19/2024)  Alcohol Screen: Low Risk (04/19/2023)  Depression (PHQ2-9): Low Risk (04/19/2024)  Financial Resource Strain: High Risk (03/20/2024)  Physical Activity: Inactive (04/19/2024)  Social Connections: Moderately Isolated (04/19/2024)  Stress: No Stress Concern Present (04/19/2024)  Tobacco Use: Medium Risk (04/19/2024)  Health Literacy: Inadequate Health Literacy (04/19/2024)   See flowsheets for full screening details  Depression Screen PHQ 2 & 9 Depression Scale- Over the past 2 weeks, how often have you been bothered by any of the following problems? Little interest or pleasure in doing things: 0 Feeling down, depressed, or hopeless (PHQ Adolescent also includes...irritable): 0 PHQ-2 Total Score: 0 Trouble falling or staying asleep, or sleeping too much: 0 Feeling tired or having little energy: 0 Poor appetite or overeating (PHQ Adolescent also includes...weight loss): 0 Feeling bad about yourself - or that you are a failure or have let yourself or your family down: 0 Trouble concentrating on things, such as reading the newspaper or watching television (PHQ Adolescent also includes...like school work): 0 Moving or speaking so slowly that other people could have noticed. Or the opposite - being so fidgety or restless that you have been moving around a lot more than usual: 0 Thoughts that you would be better off dead, or of hurting yourself in some way: 0 PHQ-9 Total Score: 0 If you checked off any problems, how difficult have these problems made it for you to do your work, take care of things at home, or get along with other people?: Not difficult at all  Depression Treatment Depression Interventions/Treatment : Currently on Treatment     Goals Addressed               This Visit's Progress     Increase physical activity (pt-stated)        Patient is having  physical therapist and she is walking more.      Manage My Medicine   On track     Timeframe:  Long-Range Goal Priority:  High Start Date:           05/29/2020                  Expected End Date: 05/29/2021                      Follow Up Date 09/26/2020    - call for medicine refill 2 or 3 days before it runs out - keep a list of all the medicines I take; vitamins and herbals too - use a pillbox to sort medicine    Why is this important?   These steps will help you keep on track with your medicines.   Notes:       Monitor and Manage My Blood Sugar-Diabetes Type 2   On track     Timeframe:  Long-Range Goal Priority:  High Start Date:        05/29/2020  Expected End Date:  05/29/2021                     Follow Up Date 09/26/2020    - check blood sugar at prescribed times - check blood sugar if I feel it is too high or too low - enter blood sugar readings and medication or insulin into daily log - take the blood sugar log to all doctor visits    Why is this important?   Checking your blood sugar at home helps to keep it from getting very high or very low.  Writing the results in a diary or log helps the doctor know how to care for you.  Your blood sugar log should have the time, date and the results.  Also, write down the amount of insulin or other medicine that you take.  Other information, like what you ate, exercise done and how you were feeling, will also be helpful.     Notes:       Track and Manage My Blood Pressure-Hypertension   On track     Timeframe:  Long-Range Goal Priority:  High Start Date:         05/29/2020                    Expected End Date:   05/29/2021                    Follow Up Date 09/26/2020    - check blood pressure daily - write blood pressure results in a log or diary    Why is this important?   You won't feel high blood pressure, but it can still hurt your blood vessels.  High blood pressure can cause heart or kidney  problems. It can also cause a stroke.  Making lifestyle changes like losing a little weight or eating less salt will help.  Checking your blood pressure at home and at different times of the day can help to control blood pressure.  If the doctor prescribes medicine remember to take it the way the doctor ordered.  Call the office if you cannot afford the medicine or if there are questions about it.     Notes:              Objective:    Today's Vitals   04/19/24 1500  BP: 122/73  Pulse: 89  Weight: (!) 324 lb (147 kg)  Height: 6' (1.829 m)   Body mass index is 43.94 kg/m.  Hearing/Vision screen No results found. Immunizations and Health Maintenance Health Maintenance  Topic Date Due   COVID-19 Vaccine (1) Never done   Hepatitis C Screening  Never done   Bone Density Scan  Never done   Fecal DNA (Cologuard)  12/18/2023   Zoster Vaccines- Shingrix (1 of 2) 06/18/2024 (Originally 08/12/1974)   Influenza Vaccine  07/03/2024 (Originally 11/04/2023)   OPHTHALMOLOGY EXAM  05/16/2024   Diabetic kidney evaluation - Urine ACR  07/01/2024   HEMOGLOBIN A1C  07/01/2024   Diabetic kidney evaluation - eGFR measurement  01/01/2025   FOOT EXAM  01/01/2025   Mammogram  04/02/2025   Medicare Annual Wellness (AWV)  04/19/2025   DTaP/Tdap/Td (2 - Td or Tdap) 12/29/2031   Pneumococcal Vaccine: 50+ Years  Completed   Meningococcal B Vaccine  Aged Out        Assessment/Plan:  This is a routine wellness examination for Boyd.  Patient Care Team: Lesley Atkin, MD as  PCP - General (Family Medicine) Onita Duos, MD as Consulting Physician (Neurology) Vincente Grip, MD as Consulting Physician (Psychiatry) Erasmo Bernardino BRAVO, OD (Optometry)   Encounter for Medicare annual wellness exam Assessment & Plan: Adult Wellness Visit Medicare wellness visit conducted on phone. Requires assistance with financial management and activities of daily living. No falls reported. Uses a walker for  ambulation. Advance directives, including living will and power of attorney, need to be established. - Ensure advance directives are in place - Continue using walker for ambulation  Orders: -     Amb Referral to Clinical Pharmacist  Diabetes mellitus type 2 with complications (HCC) Assessment & Plan: Diabetes type 2 UNCONTROLLED WITH A1C OF 8.2, with complications of microalbuminuria with current long term use of insulin.  On trulicity  1.5 mg weekly, jardiance  10 mg daily, Tresiba  65 U daily,   Orders: -     Amb Referral to Clinical Pharmacist -     AMB Referral VBCI Care Management  Hypertension associated with diabetes (HCC) Assessment & Plan: On lisinopril  40 mg which she is taking twice daily. Will consider cutting the total daily dose if well controlled BP readings at home  Orders: -     Amb Referral to Clinical Pharmacist -     AMB Referral VBCI Care Management  Essential hypertension, benign -     Amb Referral to Clinical Pharmacist -     AMB Referral VBCI Care Management  Urge incontinence Assessment & Plan: Urinary incontinence Managed with Myrbetriq , which is effective. Dicyclomine  is used for cost-effective management. Gynecology referral placed for further evaluation and management, including potential pessary use. Pelvic floor exercises recommended to improve strength. - Continue Myrbetriq  if effective - Follow up with gynecology for further evaluation and management - Continue pelvic floor exercises       Other orders -     Trulicity ; Inject 1.5 mg into the skin once a week.  Dispense: 3 mL; Refill: 3       I have personally reviewed and noted the following in the patients chart:   Medical and social history Use of alcohol, tobacco or illicit drugs  Current medications and supplements including opioid prescriptions. Functional ability and status Nutritional status Physical activity Advanced directives List of other physicians Hospitalizations,  surgeries, and ER visits in previous 12 months Vitals Screenings to include cognitive, depression, and falls Referrals and appointments  Orders Placed This Encounter  Procedures   Amb Referral to Clinical Pharmacist    Referral Priority:   Routine    Referral Type:   Consultation    Number of Visits Requested:   1   AMB Referral VBCI Care Management    Referral Priority:   Routine    Referral Type:   Consultation    Referral Reason:   Care Coordination    Number of Visits Requested:   1   In addition, I have reviewed and discussed with patient certain preventive protocols, quality metrics, and best practice recommendations. A written personalized care plan for preventive services as well as general preventive health recommendations were provided to patient.   Cambren Helm, MD   04/19/2024   Return in 1 year (on 04/19/2025).  After Visit Summary: (MyChart) Due to this being a telephonic visit, the after visit summary with patients personalized plan was offered to patient via MyChart   Nurse Notes:  "

## 2024-04-23 NOTE — Assessment & Plan Note (Signed)
 Adult Wellness Visit Medicare wellness visit conducted on phone. Requires assistance with financial management and activities of daily living. No falls reported. Uses a walker for ambulation. Advance directives, including living will and power of attorney, need to be established. - Ensure advance directives are in place - Continue using walker for ambulation

## 2024-04-23 NOTE — Assessment & Plan Note (Signed)
 Urinary incontinence Managed with Myrbetriq , which is effective. Dicyclomine  is used for cost-effective management. Gynecology referral placed for further evaluation and management, including potential pessary use. Pelvic floor exercises recommended to improve strength. - Continue Myrbetriq  if effective - Follow up with gynecology for further evaluation and management - Continue pelvic floor exercises

## 2024-04-23 NOTE — Assessment & Plan Note (Signed)
 On lisinopril  40 mg which she is taking twice daily. Will consider cutting the total daily dose if well controlled BP readings at home

## 2024-04-23 NOTE — Assessment & Plan Note (Signed)
 Diabetes type 2 UNCONTROLLED WITH A1C OF 8.2, with complications of microalbuminuria with current long term use of insulin.  On trulicity  1.5 mg weekly, jardiance  10 mg daily, Tresiba  65 U daily,

## 2024-04-26 ENCOUNTER — Telehealth: Payer: Self-pay

## 2024-04-26 LAB — OPHTHALMOLOGY REPORT-SCANNED

## 2024-04-26 NOTE — Progress Notes (Signed)
 Complex Care Management Note  Care Guide Note 04/26/2024 Name: Christy Crawford MRN: [id number] DOB: 01-19-1956  Christy Crawford is a 69 y.o. year old female who sees Sirivol, Mamatha, MD for primary care. I reached out to Christy Crawford by phone today to offer complex care management services.  Christy Crawford was given information about Complex Care Management services today including:   The Complex Care Management services include support from the care team which includes your Nurse Care Manager, Clinical Social Worker, or Pharmacist.  The Complex Care Management team is here to help remove barriers to the health concerns and goals most important to you. Complex Care Management services are voluntary, and the patient may decline or stop services at any time by request to their care team member.   Complex Care Management Consent Status: Patient agreed to services and verbal consent obtained.   Follow up plan:  Telephone appointment with complex care management team member scheduled for:  05/02/24 at 2:00 p.m.   Encounter Outcome:  Patient Scheduled  Dreama [facility]  [facility] VBCI Assistant Direct Dial: [phone]  Fax: [phone]

## 2024-05-02 ENCOUNTER — Ambulatory Visit: Admitting: Urology

## 2024-05-02 ENCOUNTER — Other Ambulatory Visit

## 2024-05-02 NOTE — Progress Notes (Unsigned)
" ° °  05/02/2024 Name: Christy Crawford MRN: [id number] DOB: 1955/05/07  Chief Complaint  Patient presents with   Medication Assistance   Diabetes    Christy Crawford is a 69 y.o. year old female who presented for a telephone visit.   They were referred to the pharmacist by their PCP for assistance in managing diabetes and medication access.    Subjective:  Care Team: Primary Care Provider: Sirivol, Mamatha, MD ; Next Scheduled Visit: *** Future Appointments  Date Time Provider Department Center  05/15/2024  2:00 PM Ivin Snuffer, MD [facility]  05/30/2024  2:00 PM [facility]     Medication Access/Adherence  Current Pharmacy:  [facility], [place] - [address] [place] [address] Phone: [phone] Fax: [phone]  [facility] [address], [place] - [address] [place] [address] Phone: [phone] Fax: [phone]   Patient reports affordability concerns with their medications: Yes Gemtessa, Trulicity , Jardiance . Patient reports access/transportation concerns to their pharmacy: No  Patient reports adherence concerns with their medications:  No     Diabetes:  Current medications: Trulicity , Tresiba , Jardiance .   Patient denies hypoglycemic s/sx including dizziness, shakiness, sweating. Patient denies hyperglycemic symptoms including polyuria, polydipsia, polyphagia, nocturia, neuropathy, blurred vision.  Current medication access support: previously was getting PAP support for DM meds, none currently.   Macrovascular and Microvascular Risk Reduction:  Statin? yes (crestor  10mg ); ACEi/ARB? yes (lisinopril  40mg ) Last urinary albumin/creatinine ratio:  Lab Results  Component Value Date   MICRALBCREAT 380 (H) 01/02/2024   MICRALBCREAT 180 (H) 09/28/2023   MICRALBCREAT 11 02/04/2023   MICRALBCREAT <13 06/15/2021   Last eye exam:  Lab Results   Component Value Date   HMDIABEYEEXA Retinopathy (A) 04/26/2024   Last foot exam: 01/02/2024 Tobacco Use:  Tobacco Use: Medium Risk (04/19/2024)   Patient History    Smoking Tobacco Use: Former    Smokeless Tobacco Use: Never    Passive Exposure: Not on file     Objective:  Lab Results  Component Value Date   HGBA1C 8.2 01/02/2024    Lab Results  Component Value Date   CREATININE 0.85 01/02/2024   BUN 16 01/02/2024   NA 144 01/02/2024   K 4.8 01/02/2024   CL 101 01/02/2024   CO2 26 01/02/2024    Lab Results  Component Value Date   CHOL 142 09/28/2023   HDL 51 09/28/2023   LDLCALC 69 09/28/2023   TRIG 123 09/28/2023   CHOLHDL 2.8 09/28/2023    Assessment/Plan:   Diabetes: - Currently uncontrolled; goal A1c <7%. Cardiorenal risk reduction has opportunities for improvement.. Blood pressure is at goal <130/80. LDL is at goal.  - Reviewed long term cardiovascular and renal outcomes of uncontrolled blood sugar. and patient assistance needs - currently needing for gemtessa, trulicity  1.5mg  and jardiance  10mg     Follow Up Plan: will coordinate with CPhT   Lang Sieve, PharmD, BCGP Clinical Pharmacist  [phone]     "

## 2024-05-15 ENCOUNTER — Ambulatory Visit

## 2024-05-30 ENCOUNTER — Other Ambulatory Visit
